# Patient Record
Sex: Male | Born: 1940 | Race: White | Hispanic: No | Marital: Married | Smoking: Never smoker
Health system: Southern US, Community
[De-identification: ages and names within clinical notes are randomized; demographics above are authoritative.]

## PROBLEM LIST (undated history)

## (undated) DIAGNOSIS — I2510 Atherosclerotic heart disease of native coronary artery without angina pectoris: Secondary | ICD-10-CM

## (undated) DIAGNOSIS — E785 Hyperlipidemia, unspecified: Secondary | ICD-10-CM

## (undated) DIAGNOSIS — K635 Polyp of colon: Secondary | ICD-10-CM

## (undated) DIAGNOSIS — M1990 Unspecified osteoarthritis, unspecified site: Secondary | ICD-10-CM

## (undated) DIAGNOSIS — I679 Cerebrovascular disease, unspecified: Secondary | ICD-10-CM

## (undated) DIAGNOSIS — I219 Acute myocardial infarction, unspecified: Secondary | ICD-10-CM

## (undated) DIAGNOSIS — I10 Essential (primary) hypertension: Secondary | ICD-10-CM

## (undated) DIAGNOSIS — K219 Gastro-esophageal reflux disease without esophagitis: Secondary | ICD-10-CM

## (undated) HISTORY — DX: Atherosclerotic heart disease of native coronary artery without angina pectoris: I25.10

## (undated) HISTORY — DX: Hyperlipidemia, unspecified: E78.5

## (undated) HISTORY — PX: CARDIAC CATHETERIZATION: SHX172

## (undated) HISTORY — DX: Cerebrovascular disease, unspecified: I67.9

## (undated) HISTORY — DX: Polyp of colon: K63.5

## (undated) HISTORY — DX: Gastro-esophageal reflux disease without esophagitis: K21.9

## (undated) HISTORY — DX: Essential (primary) hypertension: I10

---

## 1997-06-27 DIAGNOSIS — K635 Polyp of colon: Secondary | ICD-10-CM

## 1997-06-27 HISTORY — DX: Polyp of colon: K63.5

## 2001-08-22 ENCOUNTER — Ambulatory Visit (HOSPITAL_COMMUNITY): Admission: RE | Admit: 2001-08-22 | Discharge: 2001-08-22 | Payer: Self-pay | Admitting: Internal Medicine

## 2003-05-19 ENCOUNTER — Ambulatory Visit (HOSPITAL_COMMUNITY): Admission: RE | Admit: 2003-05-19 | Discharge: 2003-05-19 | Payer: Self-pay | Admitting: *Deleted

## 2003-05-29 ENCOUNTER — Inpatient Hospital Stay (HOSPITAL_COMMUNITY): Admission: RE | Admit: 2003-05-29 | Discharge: 2003-05-31 | Payer: Self-pay | Admitting: Cardiology

## 2005-01-25 ENCOUNTER — Ambulatory Visit: Payer: Self-pay | Admitting: *Deleted

## 2005-02-02 ENCOUNTER — Encounter (HOSPITAL_COMMUNITY): Admission: RE | Admit: 2005-02-02 | Discharge: 2005-03-04 | Payer: Self-pay | Admitting: *Deleted

## 2005-02-02 ENCOUNTER — Ambulatory Visit: Payer: Self-pay | Admitting: *Deleted

## 2005-02-08 ENCOUNTER — Ambulatory Visit: Payer: Self-pay | Admitting: *Deleted

## 2005-05-13 ENCOUNTER — Ambulatory Visit: Payer: Self-pay | Admitting: *Deleted

## 2005-09-25 DIAGNOSIS — I219 Acute myocardial infarction, unspecified: Secondary | ICD-10-CM

## 2005-09-25 HISTORY — DX: Acute myocardial infarction, unspecified: I21.9

## 2005-10-16 ENCOUNTER — Inpatient Hospital Stay (HOSPITAL_COMMUNITY): Admission: AD | Admit: 2005-10-16 | Discharge: 2005-10-18 | Payer: Self-pay | Admitting: Cardiology

## 2005-10-16 ENCOUNTER — Ambulatory Visit: Payer: Self-pay | Admitting: Cardiology

## 2005-10-25 ENCOUNTER — Ambulatory Visit: Payer: Self-pay

## 2005-11-01 ENCOUNTER — Encounter (HOSPITAL_COMMUNITY): Admission: RE | Admit: 2005-11-01 | Discharge: 2005-12-01 | Payer: Self-pay | Admitting: *Deleted

## 2005-11-02 ENCOUNTER — Ambulatory Visit: Payer: Self-pay | Admitting: *Deleted

## 2005-12-02 ENCOUNTER — Encounter (HOSPITAL_COMMUNITY): Admission: RE | Admit: 2005-12-02 | Discharge: 2006-01-01 | Payer: Self-pay | Admitting: *Deleted

## 2006-01-02 ENCOUNTER — Encounter (HOSPITAL_COMMUNITY): Admission: RE | Admit: 2006-01-02 | Discharge: 2006-02-01 | Payer: Self-pay | Admitting: *Deleted

## 2006-02-03 ENCOUNTER — Encounter (HOSPITAL_COMMUNITY): Admission: RE | Admit: 2006-02-03 | Discharge: 2006-03-05 | Payer: Self-pay | Admitting: *Deleted

## 2006-04-17 ENCOUNTER — Encounter (INDEPENDENT_AMBULATORY_CARE_PROVIDER_SITE_OTHER): Payer: Self-pay | Admitting: *Deleted

## 2006-04-17 ENCOUNTER — Inpatient Hospital Stay (HOSPITAL_COMMUNITY): Admission: RE | Admit: 2006-04-17 | Discharge: 2006-04-18 | Payer: Self-pay | Admitting: Vascular Surgery

## 2006-06-27 HISTORY — PX: CAROTID ENDARTERECTOMY: SUR193

## 2006-06-27 HISTORY — PX: COLONOSCOPY: SHX174

## 2006-10-11 ENCOUNTER — Encounter: Payer: Self-pay | Admitting: Physician Assistant

## 2006-10-11 ENCOUNTER — Ambulatory Visit: Payer: Self-pay | Admitting: Cardiology

## 2007-01-24 ENCOUNTER — Ambulatory Visit: Payer: Self-pay | Admitting: Cardiovascular Disease

## 2007-01-30 ENCOUNTER — Ambulatory Visit: Payer: Self-pay | Admitting: Cardiology

## 2007-01-30 ENCOUNTER — Encounter (HOSPITAL_COMMUNITY): Admission: RE | Admit: 2007-01-30 | Discharge: 2007-03-01 | Payer: Self-pay | Admitting: Cardiovascular Disease

## 2007-02-06 ENCOUNTER — Ambulatory Visit: Payer: Self-pay | Admitting: Vascular Surgery

## 2007-07-03 ENCOUNTER — Encounter (INDEPENDENT_AMBULATORY_CARE_PROVIDER_SITE_OTHER): Payer: Self-pay | Admitting: *Deleted

## 2007-07-03 LAB — CONVERTED CEMR LAB
ALT: 21 units/L
AST: 16 units/L
Albumin: 4.4 g/dL
Alkaline Phosphatase: 46 units/L
BUN: 19 mg/dL
CO2: 23 meq/L
Calcium: 9.2 mg/dL
Chloride: 107 meq/L
Cholesterol: 184 mg/dL
Creatinine, Ser: 1.09 mg/dL
Glucose, Bld: 100 mg/dL
HDL: 39 mg/dL
LDL Cholesterol: 115 mg/dL
Potassium: 5.1 meq/L
Sodium: 141 meq/L
Total Protein: 7.4 g/dL
Triglycerides: 150 mg/dL

## 2008-02-05 ENCOUNTER — Ambulatory Visit: Payer: Self-pay | Admitting: Vascular Surgery

## 2008-07-17 ENCOUNTER — Ambulatory Visit: Payer: Self-pay | Admitting: Cardiology

## 2008-07-22 ENCOUNTER — Ambulatory Visit (HOSPITAL_COMMUNITY): Admission: RE | Admit: 2008-07-22 | Discharge: 2008-07-22 | Payer: Self-pay | Admitting: Family Medicine

## 2008-07-28 HISTORY — PX: COLONOSCOPY WITH ESOPHAGOGASTRODUODENOSCOPY (EGD): SHX5779

## 2008-07-29 ENCOUNTER — Ambulatory Visit: Payer: Self-pay | Admitting: Internal Medicine

## 2008-07-31 ENCOUNTER — Ambulatory Visit: Payer: Self-pay | Admitting: Internal Medicine

## 2008-07-31 ENCOUNTER — Ambulatory Visit (HOSPITAL_COMMUNITY): Admission: RE | Admit: 2008-07-31 | Discharge: 2008-07-31 | Payer: Self-pay | Admitting: Internal Medicine

## 2008-07-31 LAB — HM COLONOSCOPY

## 2008-10-30 ENCOUNTER — Encounter (INDEPENDENT_AMBULATORY_CARE_PROVIDER_SITE_OTHER): Payer: Self-pay | Admitting: *Deleted

## 2008-11-14 ENCOUNTER — Encounter (INDEPENDENT_AMBULATORY_CARE_PROVIDER_SITE_OTHER): Payer: Self-pay | Admitting: *Deleted

## 2009-02-03 ENCOUNTER — Ambulatory Visit: Payer: Self-pay | Admitting: Vascular Surgery

## 2009-07-15 ENCOUNTER — Encounter (INDEPENDENT_AMBULATORY_CARE_PROVIDER_SITE_OTHER): Payer: Self-pay | Admitting: *Deleted

## 2009-07-16 ENCOUNTER — Encounter (INDEPENDENT_AMBULATORY_CARE_PROVIDER_SITE_OTHER): Payer: Self-pay

## 2009-07-16 LAB — CONVERTED CEMR LAB
ALT: 13 units/L
AST: 15 units/L
Albumin: 4.2 g/dL
Alkaline Phosphatase: 43 units/L
BUN: 20 mg/dL
CO2: 25 meq/L
Calcium: 9.4 mg/dL
Chloride: 105 meq/L
Cholesterol: 164 mg/dL
Creatinine, Ser: 1.08 mg/dL
GFR calc non Af Amer: >60 mL/min
Glomerular Filtration Rate, Af Am: >60 mL/min/{1.73_m2}
Glucose, Bld: 92 mg/dL
HCT: 42.7 %
HDL: 35 mg/dL
Hemoglobin: 13.7 g/dL
LDL Cholesterol: 98 mg/dL
MCV: 93 fL
Platelets: 273 10*3/uL
Potassium: 5 meq/L
Sodium: 139 meq/L
Total Protein: 7.1 g/dL
Triglycerides: 157 mg/dL
WBC: 6.5 10*3/uL

## 2009-07-16 LAB — PSA: PSA: 1.44

## 2009-07-17 ENCOUNTER — Encounter (INDEPENDENT_AMBULATORY_CARE_PROVIDER_SITE_OTHER): Payer: Self-pay

## 2009-07-21 ENCOUNTER — Ambulatory Visit: Payer: Self-pay | Admitting: Cardiology

## 2009-07-21 ENCOUNTER — Encounter (INDEPENDENT_AMBULATORY_CARE_PROVIDER_SITE_OTHER): Payer: Self-pay | Admitting: *Deleted

## 2009-07-21 DIAGNOSIS — K8020 Calculus of gallbladder without cholecystitis without obstruction: Secondary | ICD-10-CM | POA: Insufficient documentation

## 2009-09-16 ENCOUNTER — Encounter (INDEPENDENT_AMBULATORY_CARE_PROVIDER_SITE_OTHER): Payer: Self-pay | Admitting: *Deleted

## 2009-09-16 ENCOUNTER — Encounter: Payer: Self-pay | Admitting: Cardiology

## 2009-09-16 LAB — CONVERTED CEMR LAB
Cholesterol: 169 mg/dL
Cholesterol: 169 mg/dL
HDL: 39 mg/dL
HDL: 39 mg/dL — ABNORMAL LOW
LDL Cholesterol: 91 mg/dL
LDL Cholesterol: 91 mg/dL
Total CHOL/HDL Ratio: 4.3
Triglycerides: 196 mg/dL
Triglycerides: 196 mg/dL — ABNORMAL HIGH
VLDL: 39 mg/dL

## 2009-09-17 ENCOUNTER — Encounter (INDEPENDENT_AMBULATORY_CARE_PROVIDER_SITE_OTHER): Payer: Self-pay | Admitting: *Deleted

## 2009-09-18 ENCOUNTER — Encounter (INDEPENDENT_AMBULATORY_CARE_PROVIDER_SITE_OTHER): Payer: Self-pay | Admitting: *Deleted

## 2010-06-08 ENCOUNTER — Ambulatory Visit: Payer: Self-pay | Admitting: Vascular Surgery

## 2010-07-19 ENCOUNTER — Encounter (INDEPENDENT_AMBULATORY_CARE_PROVIDER_SITE_OTHER): Payer: Self-pay | Admitting: *Deleted

## 2010-07-22 ENCOUNTER — Ambulatory Visit
Admission: RE | Admit: 2010-07-22 | Discharge: 2010-07-22 | Payer: Self-pay | Source: Home / Self Care | Attending: Cardiology | Admitting: Cardiology

## 2010-07-22 ENCOUNTER — Encounter (INDEPENDENT_AMBULATORY_CARE_PROVIDER_SITE_OTHER): Payer: Self-pay | Admitting: *Deleted

## 2010-07-27 ENCOUNTER — Encounter: Payer: Self-pay | Admitting: Cardiology

## 2010-07-27 LAB — CONVERTED CEMR LAB
ALT: 11 units/L (ref 0–53)
AST: 13 units/L (ref 0–37)
Albumin: 4.2 g/dL (ref 3.5–5.2)
Alkaline Phosphatase: 36 units/L — ABNORMAL LOW (ref 39–117)
BUN: 21 mg/dL (ref 6–23)
CO2: 23 meq/L (ref 19–32)
Calcium: 9.3 mg/dL (ref 8.4–10.5)
Chloride: 104 meq/L (ref 96–112)
Cholesterol: 149 mg/dL (ref 0–200)
Creatinine, Ser: 1.28 mg/dL (ref 0.40–1.50)
Glucose, Bld: 93 mg/dL (ref 70–99)
HDL: 34 mg/dL — ABNORMAL LOW (ref >39)
LDL Cholesterol: 81 mg/dL (ref 0–99)
Potassium: 4.4 meq/L (ref 3.5–5.3)
Sodium: 138 meq/L (ref 135–145)
Total Bilirubin: 0.4 mg/dL (ref 0.3–1.2)
Total CHOL/HDL Ratio: 4.4
Total Protein: 6.8 g/dL (ref 6.0–8.3)
Triglycerides: 171 mg/dL — ABNORMAL HIGH (ref <150)
VLDL: 34 mg/dL (ref 0–40)

## 2010-07-27 NOTE — Assessment & Plan Note (Signed)
Summary: 1 YR F/U PER CKOUT 07/17/08-DSF  Medications Added TOPROL XL 25 MG XR24H-TAB (METOPROLOL SUCCINATE) take 1 tab daily SIMVASTATIN 20 MG TABS (SIMVASTATIN) take 1 tab daily SIMVASTATIN 40 MG TABS (SIMVASTATIN) Take one tablet by mouth daily at bedtime PROTONIX 40 MG SOLR (PANTOPRAZOLE SODIUM) take 1 tab daily      Allergies Added: NKDA  Visit Type:  Follow-up Primary Provider:  Dr.Mark Nobie Putnam   History of Present Illness: Return visit for this very pleasant 70 year old gentleman with coronary artery disease that has been quiescent for 4 years since he suffered stenosis in a stent that had been inserted 3 years prior.  He denies all cardiopulmonary symptoms.  He remains active without chest discomfort or dyspnea.  He has noted no edema.  He has had no significant medical problems over the past year.  In a recent annual evaluation by Dr. Nobie Putnam, attention was drawn to his history of cholelithiasis and to a suboptimal lipid profile  EKG  Procedure date:  07/21/2009  Findings:      Sinus bradycardia at a rate of 53 Left axis deviation Borderline low voltage in the chest leads Abnormal R wave progression Comparison with prior study of 07/17/2008-R wave progression is      now abnormal  Nuclear ETT  Procedure date:  01/30/2007  Findings:      Negative study with adequate exercise tolerance,  a positive stress electrocardiogram in the absence of angina, mild left ventricular dilatation, mildly impaired left ventricular systolic function in a somewhat segmental pattern and normal myocardial perfusion.   Current Medications (verified): 1)  Ramipril 10 Mg Caps (Ramipril) .... Take One Capsule By Mouth Daily 2)  Nitroglycerin 0.4 Mg Subl (Nitroglycerin) .... One Tablet Under Tongue Every 5 Minutes As Needed For Chest Pain---May Repeat Times Three 3)  Plavix 75 Mg Tabs (Clopidogrel Bisulfate) .... Take One Tablet By Mouth Daily 4)  Toprol Xl 25 Mg Xr24h-Tab (Metoprolol  Succinate) .... Take 1 Tab Daily 5)  Simvastatin 40 Mg Tabs (Simvastatin) .... Take One Tablet By Mouth Daily At Bedtime 6)  Protonix 40 Mg Solr (Pantoprazole Sodium) .... Take 1 Tab Daily  Allergies (verified): No Known Drug Allergies  Past History:  PMH, FH, and Social History reviewed and updated.  Past Medical History: ASCVD : stent to RI in 2002/11/12. 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 while      off Plavix->reintervention Cerebrovascular disease: left carotid bruit with 40-59% ICA stenosis; right carotid endarterectomy in 11/12/2006 HYPERTENSION, UNSPECIFIED (ICD-401.9) HYPERCHOLESTEROLEMIA  IIA (ICD-272.0) Colonic polyps: adenoma resected in 11/11/1997; history of diverticula  Past Surgical History: Right carotid endarterectomy in Nov 12, 2006  Family History: Father died at age 30 due to trauma; history of cardiac disease Grandparents had history of myocardial infarction and CVA Siblings: 3 brothers and one sister; one brother with conduction system disease  Social History: Part Time - [place] x's 50 yrs Married and resides in [place] Tobacco Use - No.  Alcohol Use - no Drug Use - no  Review of Systems       see history of present illness  Vital Signs:  Patient profile:   70 year old male Height:      68 inches Weight:      197 pounds BMI:     30.06 Pulse rate:   63 / minute BP sitting:   119 / 74  (right arm)  Vitals Entered By: Dreama Saa, CNA (July 21, 2009 1:09 PM)  Physical Exam  General:   General-Well developed; no acute distress:   Neck-No JVD; faint left carotid bruit; right carotid endarterectomy scar Lungs-No tachypnea, no rales; no rhonchi; no wheezes: Cardiovascular-normal PMI; normal S1 and S2; S4 present Abdomen-BS normal; soft and non-tender without masses or organomegaly:  Musculoskeletal-No deformities, no cyanosis or clubbing: Neurologic-Normal cranial nerves; symmetric strength and tone:  Skin-Warm, no  significant lesions: Extremities-Nl distal pulses; trace edema:     Impression & Recommendations:  Problem # 1:  ASCVD Mr. Kemppainen has done superbly since repeat coronary intervention 4 years ago.  The good news at that time was that there had been no progression of disease in other vessels.  Our current approach to his coronary problems will be continued.  Problem # 2:  Cerebrovascular disease He had a widely patent right carotid endarterectomy site and no progression of disease in the left internal carotid artery when last assessed by Dr. Hart Rochester approximately one year ago.  Vascular surgery will continue to follow him for this problem.  Problem # 3:  HYPERTENSION Blood pressure is well controlled.  Problem # 4:  CHOLELITHIASIS, ASYMPTOMATIC (ICD-574.20) I concur with Dr. Nobie Putnam that intervention is not required until symptoms occur that are clearly related to the patient's gallbladder disease.  Problem # 5:  HYPERCHOLESTEROLEMIA  IIA (ICD-272.0) Recent lipid profile was fairly good, but suboptimal with an LDL close to 100.  I fully concur with Dr. Geanie Logan plan to increase simvastatin to 40 mg q.d.  He may in fact require 80 mg per day, but a repeat lipid profile will be checked in 2 months prior to further adjustment of his medication.  I will see this nice gentleman again in one year.  Other Orders: Future Orders: T-Lipid Profile ([id number]) ... 09/18/2009  Patient Instructions: 1)  Your physician recommends that you schedule a follow-up appointment in: 1 YEAR 2)  Your physician recommends that you return for lab work in: 2 MONTHS

## 2010-07-27 NOTE — Miscellaneous (Signed)
Summary: CMP,CBC w/diff and Lipids  Clinical Lists Changes  Observations: Added new observation of CALCIUM: 9.4 mg/dL (16/03/9603 54:09) Added new observation of ALBUMIN: 4.2 g/dL (81/19/1478 29:56) Added new observation of PROTEIN, TOT: 7.1 g/dL (21/30/8657 84:69) Added new observation of SGPT (ALT): 13 units/L (07/16/2009 16:10) Added new observation of SGOT (AST): 15 units/L (07/16/2009 16:10) Added new observation of ALK PHOS: 43 units/L (07/16/2009 16:10) Added new observation of BILI DIRECT: Bili total: 0.4 mg/dL (62/95/2841 32:44) Added new observation of GFR AA: >60 mL/min/1.40m2 (07/16/2009 16:10) Added new observation of GFR: >60 mL/min (07/16/2009 16:10) Added new observation of CREATININE: 1.08 mg/dL (06/29/7251 66:44) Added new observation of BUN: 20 mg/dL (03/47/4259 56:38) Added new observation of BG RANDOM: 92 mg/dL (75/64/3329 51:88) Added new observation of CO2 PLSM/SER: 25 meq/L (07/16/2009 16:10) Added new observation of CL SERUM: 105 meq/L (07/16/2009 16:10) Added new observation of K SERUM: 5.0 meq/L (07/16/2009 16:10) Added new observation of NA: 139 meq/L (07/16/2009 16:10) Added new observation of LDL: 98 mg/dL (41/66/0630 16:01) Added new observation of HDL: 35 mg/dL (09/32/3557 32:20) Added new observation of TRIGLYC TOT: 157 mg/dL (25/42/7062 37:62) Added new observation of CHOLESTEROL: 164 mg/dL (83/15/1761 60:73) Added new observation of PLATELETK/UL: 273 K/uL (07/16/2009 16:10) Added new observation of MCV: 93.0 fL (07/16/2009 16:10) Added new observation of HCT: 42.7 % (07/16/2009 16:10) Added new observation of HGB: 13.7 g/dL (71/11/2692 85:46) Added new observation of WBC COUNT: 6.5 10*3/microliter (07/16/2009 16:10)

## 2010-07-27 NOTE — Letter (Signed)
Summary: [place] Future Lab Work Letter  [facility]     [address], [place]    Phone:   Fax:      July 21, 2009 MRN: [id number]   [facility] [address] [place], [place]  [address]      YOUR LAB WORK IS DUE  September 18, 2009 _________________________________________  Please go to [facility], located across the street from [facility] on the second floor.  Hours are Monday - Friday 7am until 7:30pm         Saturday 8am until 12noon    _X_  DO NOT EAT OR DRINK AFTER MIDNIGHT EVENING PRIOR TO LABWORK  __ YOUR LABWORK IS NOT FASTING --YOU MAY EAT PRIOR TO LABWORK

## 2010-07-27 NOTE — Letter (Signed)
Summary: [place] Results Engineer, agricultural at [facility]  [address], [place] [address]   Phone: [phone]  Fax: [phone]      September 18, 2009 MRN: [id number]   Kevin Reeves [address] [place], [place]  [address]   Dear Mr. Langhorne,  Your test ordered by Selena Batten has been reviewed by your physician (or physician assistant) and was found to be normal or stable. Your physician (or physician assistant) felt no changes were needed at this time.  ____ Echocardiogram  ____ Cardiac Stress Test  ___x_ Lab Work  ____ Peripheral vascular study of arms, legs or neck  ____ CT scan or X-ray  ____ Lung or Breathing test  ____ Other:  No change in medical treatment at this time, per Dr. Dietrich Pates.  Enclosed is a copy of your labwork for your records.   Thank you, [name] RN    [name], MD, Lenise Arena.C.Gaylord Shih, MD, F.A.C.C Lewayne Bunting, MD, F.A.C.C Nona Dell, MD, F.A.C.C Charlton Haws, MD, Lenise Arena.C.C

## 2010-07-27 NOTE — Miscellaneous (Signed)
Summary: LABS CMP,LIPIDS,07/03/2007  Clinical Lists Changes  Observations: Added new observation of CALCIUM: 9.2 mg/dL (69/62/9528 41:32) Added new observation of ALBUMIN: 4.4 g/dL (44/06/270 53:66) Added new observation of PROTEIN, TOT: 7.4 g/dL (44/08/4740 59:56) Added new observation of SGPT (ALT): 21 units/L (07/03/2007 15:15) Added new observation of SGOT (AST): 16 units/L (07/03/2007 15:15) Added new observation of ALK PHOS: 46 units/L (07/03/2007 15:15) Added new observation of CREATININE: 1.09 mg/dL (38/75/6433 29:51) Added new observation of BUN: 19 mg/dL (88/41/6606 30:16) Added new observation of BG RANDOM: 100 mg/dL (07/05/3233 57:32) Added new observation of CO2 PLSM/SER: 23 meq/L (07/03/2007 15:15) Added new observation of CL SERUM: 107 meq/L (07/03/2007 15:15) Added new observation of K SERUM: 5.1 meq/L (07/03/2007 15:15) Added new observation of NA: 141 meq/L (07/03/2007 15:15) Added new observation of LDL: 115 mg/dL (20/25/4270 62:37) Added new observation of HDL: 39 mg/dL (62/83/1517 61:60) Added new observation of TRIGLYC TOT: 150 mg/dL (73/71/0626 94:85) Added new observation of CHOLESTEROL: 184 mg/dL (46/27/0350 09:38)

## 2010-07-27 NOTE — Letter (Signed)
Summary: [place] Results Engineer, agricultural at [facility]  [address], [place] [address]   Phone: [phone]  Fax: [phone]      September 17, 2009 MRN: [id number]   Kevin Reeves [address] [place], [place]  [address]   Dear Mr. Jain,  Your test ordered by Selena Batten has been reviewed by your physician (or physician assistant) and was found to be normal or stable. Your physician (or physician assistant) felt no changes were needed at this time.  ____ Echocardiogram  ____ Cardiac Stress Test  __x__ Lab Work  ____ Peripheral vascular study of arms, legs or neck  ____ CT scan or X-ray  ____ Lung or Breathing test  ____ Other:  No change in medical treatment at this time, per Dr. Dietrich Pates.  Enclosed is a copy of  your labwork for your records.  Thank you, [name] RN    [place] Bing, MD, Lenise Arena.C.Gaylord Shih, MD, F.A.C.C Lewayne Bunting, MD, F.A.C.C Nona Dell, MD, F.A.C.C Charlton Haws, MD, Lenise Arena.C.C

## 2010-07-29 NOTE — Letter (Signed)
Summary: [place] Future Lab Work Engineer, agricultural at [facility]  [address] [place] [address]   Phone: [phone]  Fax: [phone]     July 22, 2010 MRN: [id number]   Kevin Reeves [address] [place], [place]  [address]      YOUR LAB WORK IS DUE   MONDAY   July 26, 2010  Please go to [facility], located across the street from [facility] on the second floor.  Hours are Monday - Friday 7am until 7:30pm         Saturday 8am until 12noon    __  DO NOT EAT OR DRINK AFTER MIDNIGHT EVENING PRIOR TO LABWORK

## 2010-07-29 NOTE — Miscellaneous (Signed)
Summary: labs lipids 09/16/2009  Clinical Lists Changes  Observations: Added new observation of LDL: 91 mg/dL (16/03/9603 54:09) Added new observation of HDL: 39 mg/dL (81/19/1478 29:56) Added new observation of TRIGLYC TOT: 196 mg/dL (21/30/8657 84:69) Added new observation of CHOLESTEROL: 169 mg/dL (62/95/2841 32:44)

## 2010-08-04 NOTE — Assessment & Plan Note (Signed)
Summary: 1 YR F/U PER CHECKOUT ON 07/21/09/TG  Medications Added DAILY MULTI  TABS (MULTIPLE VITAMINS-MINERALS) take 1 tab daily      Allergies Added: NKDA  Visit Type:  Follow-up Referring Provider:  Cynda Reeves Primary Provider:  Dr.Fusco   History of Present Illness: [name] returns to the office as scheduled for continued assessment and treatment of coronary disease and cardiovascular risk factors.  He previously suffered stent thrombosis following discontinuation of clopidogrel and is now committed to lifelong treatment with this or a similar agent.  Over the past year, he has done very well with good exercise tolerance, no chest discomfort, no dyspnea, no orthopnea, no PND, no syncope and no pedal edema.  He has gastroesophageal reflux disease requiing an esophageal dilatation for stricture in the past.  [name] follows him for vascular disease, and a recent carotid ultrasound in his office apparently showed good results.  An abdominal ultrasound obtained at [facility] showed no abdominal aortic disease.   Current Medications (verified): 1)  Ramipril 10 Mg Caps (Ramipril) .... Take One Capsule By Mouth Daily 2)  Nitroglycerin 0.4 Mg Subl (Nitroglycerin) .... One Tablet Under Tongue Every 5 Minutes As Needed For Chest Pain---May Repeat Times Three 3)  Plavix 75 Mg Tabs (Clopidogrel Bisulfate) .... Take One Tablet By Mouth Daily 4)  Toprol Xl 25 Mg Xr24h-Tab (Metoprolol Succinate) .... Take 1 Tab Daily 5)  Simvastatin 40 Mg Tabs (Simvastatin) .... Take One Tablet By Mouth Daily At Bedtime 6)  Protonix 40 Mg Solr (Pantoprazole Sodium) .... Take 1 Tab Daily 7)  Daily Multi  Tabs (Multiple Vitamins-Minerals) .... Take 1 Tab Daily  Allergies (verified): No Known Drug Allergies  Comments:  Nurse/Medical Assistant: patient brought med list we also reviewed previous ov for meds [facility]  Past History:  PMH, FH, and Social History reviewed and  updated.  Past Medical History: ASCVD : stent to RI in 2004. 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  Vital Signs:  Patient profile:   70 year old male Weight:      192 pounds BMI:     29.30 O2 Sat:      95 % on Room air Pulse rate:   57 / minute BP sitting:   107 / 69  (left arm)  Vitals Entered By: Kevin Saa, CNA (July 22, 2010 11:05 AM)  O2 Flow:  Room air  Physical Exam  General:  Mildly overweight; well developed; no acute distress:   Neck-No JVD;  left carotid bruit; right carotid endarterectomy scar Lungs-No tachypnea, no rales; no rhonchi; no wheezes: Cardiovascular-normal PMI; normal S1 and S2; S4 present Abdomen-BS normal; soft and non-tender without masses or organomegaly; aortic pulsation not palpable. Musculoskeletal-No deformities, no cyanosis or clubbing: Neurologic-Normal cranial nerves; symmetric strength and tone:  Skin-Warm, no significant lesions: Extremities-Nl distal pulses; trace edema:     Impression & Recommendations:  Problem # 1:  ATHEROSCLEROTIC CARDIOVASCULAR DISEASE (ICD-429.2) Patient remains asymptomatic.  We will continue optimal control of cardiovascular risk factors.  Problem # 2:  HYPERTENSION (ICD-401.9) Control is excellent with current medications, which will be continued.  Problem # 3:  HYPERLIPIDEMIA (ICD-272.4) Fasting lipid profile to be obtained.  I will plan to see  this very nice gentleman again in one year.  Other Orders: Future Orders: T-Lipid Profile ([id number]) ... 07/26/2010 T-Comprehensive Metabolic Panel [id number]) ... 07/26/2010  Patient Instructions: 1)  Your physician recommends that you schedule a follow-up appointment in: 1 YEAR 2)  Your physician recommends that you return for lab work in: [employer]

## 2010-10-12 LAB — H. PYLORI ANTIBODY, IGG: H Pylori IgG: 0.6 {ISR}

## 2010-11-09 NOTE — Consult Note (Signed)
NEW PATIENT CONSULTATION   Kevin Reeves, Kevin Reeves  DOB:  08/13/1940                                       06/08/2010  CHART#:[id number]   Patient is a 70 year old male patient known to me from previous right  carotid endarterectomy 3-1/2 years ago for an asymptomatic severe right  internal carotid stenosis.  He has had no hemispheric or nonhemispheric  TIAs, amaurosis fugax, diplopia, blurred vision or syncope.  He does  take Plavix on a daily basis.   CHRONIC MEDICAL PROBLEMS:  1. Coronary artery disease, previous PTCA and stenting in 2007.  2. Hypertension.  3. Hyperlipidemia.  4. Arthritis with severe right shoulder pain.   SOCIAL HISTORY:  The patient is married, retired from [employer].  Has 3  children.  Does not use tobacco or alcohol.   FAMILY HISTORY:  Positive for coronary artery disease in his father.  Arrhythmias in his brother.  Stroke in a grandfather and diabetes in his  father.   REVIEW OF SYSTEMS:  Denies any chest pain, dyspnea on exertion,  anorexia, weight loss, productive cough, bronchitis, wheezing.  Positive  for arthritis and joint pain, particularly right shoulder.  No other  systems are negative for review of systems.   PHYSICAL EXAMINATION:  Blood pressure 110/72, heart rate 60,  respirations 14.  General:  He is a well-developed and well-nourished  male who is in no apparent distress, alert and oriented x3.  HEENT:  Normal for age.  EOMs intact.  Lungs:  Clear to auscultation.  No  rhonchi or wheezing.  Cardiovascular:  Regular rate and rhythm.  No  murmurs.  Carotid pulses are 3+.  No bruits on the right.  A very soft  bruit on the left.  Abdomen:  Soft, nontender with no masses.  Musculoskeletal:  Free of major deformities.  Neurologic:  Normal.  Skin:  Free of rashes.  Lower extremity exam reveals 3+ femoral pulses  bilaterally with well-perfused lower extremities.  No edema.   Today I ordered a carotid duplex exam which I have  reviewed and  interpreted.  He has a moderate approximately 50% left internal carotid  stenosis with no evidence of flow reduction on the right side where the  endarterectomy was performed.   I reassured him regarding these findings.  He will continue taking his  Plavix for his cardiac stent and return to see [place] in a year for a follow-  up carotid duplex unless he develops any symptoms in the interim.     Quita Skye Hart Rochester, M.D.  Electronically Signed   JDL/MEDQ  D:  06/08/2010  T:  06/09/2010  Job:  [id number]   cc:   Dr. Sherwood Gambler

## 2010-11-09 NOTE — Procedures (Signed)
CAROTID DUPLEX EXAM   INDICATION:  Followup of known carotid artery disease.  The patient is  currently asymptomatic.   HISTORY:  Diabetes:  No.  Cardiac:  Arrhythmia, MI, angioplasty and stent in 2004 and 2007.  Hypertension:  Yes.  Smoking:  Previous Surgery:  Right CEA with DPA on 04/17/2006 by Dr. Hart Rochester.  CV History:  Amaurosis Fugax No, Paresthesias No, Hemiparesis No                                       RIGHT             LEFT  Brachial systolic pressure:         110               108  Brachial Doppler waveforms:         Triphasic         Triphasic  Vertebral direction of flow:        Antegrade         Antegrade  DUPLEX VELOCITIES (cm/sec)  CCA peak systolic                   96                68  ECA peak systolic                   92                281  ICA peak systolic                   81                164  ICA end diastolic                   32                70  PLAQUE MORPHOLOGY:                  Intimal thickening                  Mixed  PLAQUE AMOUNT:                      Minimal           Moderate  PLAQUE LOCATION:                    Bifurcation, ICA  ICA, ECA   IMPRESSION:  1. Right ICA without recurrent stenosis status post CEA.  2. Left 40-59% ICA stenosis.  3. Left ECA stenosis.  4. Study essentially unchanged from that on 02/06/2007.   ___________________________________________  Quita Skye. Hart Rochester, M.D.   PB/MEDQ  D:  02/06/2008  T:  02/06/2008  Job:  [id number]

## 2010-11-09 NOTE — Op Note (Signed)
NAME:  Kevin Reeves, Kevin Reeves               ACCOUNT NO.:  [id number]   MEDICAL RECORD NO.:  [id number]          PATIENT TYPE:  AMB   LOCATION:  DAY                           FACILITY:  [facility]   PHYSICIAN:  R. Roetta Sessions, M.D. DATE OF BIRTH:  Sep 03, 1940   DATE OF PROCEDURE:  07/31/2008  DATE OF DISCHARGE:                               OPERATIVE REPORT   Diagnostic EGD followed by surveillance colonoscopy.   INDICATIONS FOR PROCEDURE:  A 67-year gentleman who had longstanding  chronic gastroesophageal reflux disease symptoms and intermittent  episodes of nausea and vomiting superimposed.  He also has a history of  colonic polyps.  Recent gallbladder ultrasound demonstrated  cholelithiasis.  There was a 15-mm calculus in the neck of the  gallbladder.  I talked to Mr. Marban recently in the office and told him  that he likely will need to get his gallbladder out prior to surgical  referral.  Will go ahead and size up his upper GI tract and perform a  surveillance colonoscopy.  Risks, benefits, alternatives and limitations  of this approach have been reviewed, questions answered, he is  agreeable.  Please see the documentation in the medical record.   PROCEDURE NOTE:  The O2 saturation, blood pressure, pulse and  respirations were monitored throughout the entire procedure.   CONSCIOUS SEDATION:  1. Versed 0.5 mg IV.  2. Demerol 100 mg IV in divided doses for both procedures.  3. Cetacaine spray for topical oropharyngeal anesthesia.   INSTRUMENTATION:  Pentax video chip system.   FINDINGS:  An EGD examination of tubular esophagus revealed four-  quadrant distal esophageal erosions straddling a Schatzki's ring.  There  was no Barrett's esophagus.  The ring was patent and easily traversed.   Stomach:  Gastric cavity was emptied and insufflated well with air.  A  thorough examination of the gastric mucosa including retroflexed  proximal stomach and esophagogastric junction demonstrated a  moderate  sized hiatal hernia.  Again, the ring was seen well.  There were some  antral erosions, otherwise gastric mucosa appeared unremarkable.  Pylorus was patent, easily traversed.  Examination of the bulb and  second portion was undertaken.  There was a short, benign-appearing,  duodenal web between D1 and D2 which actually initially held off  advancement of the scope down the second portion of the duodenum with  some gentle pressure, this was traumatically dilated and the scope slid  down into the second portion of the duodenum.  Mucosa otherwise appeared  normal aside from scattered bulbar erosions.   Therapeutic/Diagnostic Maneuvers Performed:  None.   The patient tolerated procedure well, was prepared for colonoscopy.  Digital rectal exam revealed no abnormalities.  His __________prep was  adequate.   Colon:  The colonic mucosa was surveyed from the rectosigmoid junction  through the left transverse, right colon to the appendiceal orifice,  ileocecal valve and cecum.  These structures were well seen,  photographed for the record.  From this level the scope was cautiously  slowly withdrawn.  All previously-mentioned mucosal surfaces were again  seen.  The patient was noted to have  scattered pancolonic diverticula.  The remainder of colonic mucosa appeared normal.  Scope was pulled down  in the rectum where thorough examination of rectal mucosa, including  retroflexed view of the anal verge, demonstrated no abnormalities.  The  patient tolerated both procedures well, was reactive to endoscopy.   IMPRESSION:  1. Esophagogastroduodenoscopy:  Distal esophageal erosions consistent      with a mild erosive reflux esophagitis, noncritical Schatzki's ring      not manipulated, otherwise unremarkable esophagus.  2. Moderate-sized hiatal hernia, antral erosions, otherwise      unremarkable gastric mucosa.  3. Bulbar erosions with duodenal web as described above, dilated with      the  scope, otherwise D1 and D2 appeared unremarkable.   COLONOSCOPY FINDINGS:  Normal rectum, pancolonic diverticula, colonic  mucosa appeared normal.   RECOMMENDATIONS:  1. Begin Protonix 40 mg orally daily for gastroesophageal reflux      disease.  2. Check H. pylori serologies.  3. Diverticulosis literature provided to Mr. Baldinger.  4. Repeat surveillance colonoscopy in 5 years.  5. I have advised Mr. Staver to plan to see a surgeon to get his      gallbladder out electively in the near future.   He is not sure which surgeon he wants to see and he is going to stop by  and talk surgical referral over a bit with Dr. Nobie Putnam.      Jonathon Bellows, M.D.  Electronically Signed     RMR/MEDQ  D:  07/31/2008  T:  07/31/2008  Job:  [id number]   cc:   [name], M.D.  Fax: [phone]

## 2010-11-09 NOTE — Consult Note (Signed)
NAME:  Kevin Reeves, Kevin Reeves               ACCOUNT NO.:  [id number]   MEDICAL RECORD NO.:  [id number]          PATIENT TYPE:  AMB   LOCATION:  DAY                           FACILITY:  [facility]   PHYSICIAN:  R. Roetta Sessions, M.D. DATE OF BIRTH:  Jun 09, 1941   DATE OF CONSULTATION:  07/29/2008  DATE OF DISCHARGE:                                 CONSULTATION   REASON FOR CONSULTATION:  Intermittent nausea and vomiting,  cholelithiasis.   HISTORY OF PRESENT ILLNESS:  Kevin Reeves is a pleasant 70 year old  Caucasian male referred to [place] by Dr. [name] to further evaluate a  3-4 year history of intermittent nausea and vomiting.  Kevin Reeves tells  me that several times a month he can have episodes where he does about  his daily routine and does fine, goes to bed and about 2 or 3 o'clock in  the morning he has the urge to vomit.  He gets up and heaves and  actually brings up very little material and then symptoms settle down.  He denies really any abdominal pain.  He does describe heartburn  symptoms 2-3 times weekly for which he takes Tums.  He does not have any  odynophagia or dysphagia.  He does not use any nonsteroidal agents  beyond one aspirin a day.  He has never had his upper GI tract  evaluated.  He does take Plavix.   He does have a distant history of colonic adenoma removed by me in 1999  with a negative colonoscopy in 2003 (except for hyperplastic polyp).  He  is due for a surveillance exam.  He is not having any melena or  hematochezia.  He has lost about 10 pounds since 2003.  He denies early  satiety.  There has been no yellow jaundice, clay-colored stools or dark-  colored urine.  He was given some Kapidex by Dr. Nobie Putnam last month for  gastroesophageal reflux disease symptoms, but he admits to not taking  any of that agent thus far.  Gallbladder ultrasound on July 22, 2008  demonstrated cholelithiasis without evidence of cholecystitis.  There  was a 15 mm diameter  calculus in the gallbladder neck.   PAST MEDICAL HISTORY:  1. Coronary artery disease status post MI in 2007.  2. Hypertension.  3. Hypercholesterolemia.  4. History of peripheral arterial disease.  He has a stent in his      carotid artery.   CURRENT MEDICATIONS:  1. Toprol 25 mg daily.  2. Plavix 75 mg daily.  3. Altace 10 mg daily.  4. Vytorin 10/80 daily.  5. ASA 325 mg daily.  6. Vitamin supplement daily.  7. Nitroglycerin p.r.n.   ALLERGIES:  NO KNOWN DRUG ALLERGIES   FAMILY HISTORY:  Mother is alive at age 83 with dementia.  Father is  deceased at age 85 with heart problems.  No history of chronic GI or  liver illness.  No history of GI neoplasia.   SOCIAL HISTORY:  The patient is married.  He has 3 children and 4  grandchildren.  He works part-time to [employer] where he  has worked for  50 years.  No tobacco, no alcohol, no illicit drugs.   REVIEW OF SYSTEMS:  No recent chest pain, no dyspnea on exertion.  No  fever, chills.  Otherwise as in history of present illness.   PHYSICAL EXAMINATION:  GENERAL:  A pleasant 70 year old gentleman  resting comfortably.  VITAL SIGNS:  Weight 202, height 5 feet, 9 inches.  Temperature 97.7,  blood pressure 122/72, pulse 80.  SKIN:  Warm and dry.  HEENT:  No scleral icterus.  Conjunctivae are pink.  CHEST:  Lungs are clear to auscultation.  CARDIOVASCULAR:  Regular rate and rhythm without murmur, gallop or rub.  ABDOMEN:  Nondistended.  Positive bowel sounds, soft, nontender without  appreciable mass or organomegaly.  EXTREMITIES:  No edema.  RECTAL:  Exam deferred to the time of colonoscopy.   IMPRESSION:  Kevin Reeves is a pleasant 70 year old gentleman with  intermittent episodes of nausea and vomiting with a paucity of any  abdominal pain.  He also has what I feel are separate symptoms  consistent with gastroesophageal reflux disease.  He does have  cholelithiasis on recent ultrasound.   He has a distant history of  colonic adenomatous polyps and is due for  surveillance colonoscopy.   It is my impression that his intermittent nausea and vomiting, although  atypical, is most likely related to cholelithiasis.   RECOMMENDATIONS:  Ultimately, I feel Kevin Reeves is probably going to end  up getting his gallbladder out.  However, will go ahead and set him up  for surveillance colonoscopy, and at the same time will perform a  diagnostic EGD to size up his upper GI tract and assess him for  complicated gastroesophageal reflux disease and to rule out any luminal  pathology in his upper GI tract which may be contributing to  intermittent nausea and vomiting.  Will set endoscopic evaluation up in  the very near future, and then will make further recommendations  regarding probable surgical referral for cholecystectomy at that time.   I would like to thank Dr. [name] for allowing me to see this nice  gentleman.      Jonathon Bellows, M.D.  Electronically Signed     RMR/MEDQ  D:  07/29/2008  T:  07/29/2008  Job:  [id number]   cc:   [name], M.D.  Fax: [phone]

## 2010-11-09 NOTE — Assessment & Plan Note (Signed)
[facility] OFFICE NOTE   [name], [name]                      MRN:          [id number]  DATE:01/24/2007                            DOB:          [birth year]    Kevin Reeves returns today for follow-up.  He has a history of previous stent  to the ramus branch with reocclusion and cutting balloon angioplasty.  He needs a follow-up Myoview.  This was done in April 2007.   He also has vascular disease with a previous right CEA and residual  bruit.  He has not  his cholesterol checked in a while.  In talking to  him, he needs a refill on his Toprol and nitroglycerin.   He does not have any significant chest pain.  He is active.  He is  working at a farm supply three mornings a week.   REVIEW OF SYSTEMS:  Otherwise negative.   CURRENT MEDICATIONS:  1. Aspirin a day.  2. Plavix 75 a day.  3. Vytorin 10/80.  4. Ramipril 10 a day.   PHYSICAL EXAMINATION:  VITAL SIGNS:  Blood pressure 110/70, pulse 68 and  regular, afebrile, weight 191, respiratory rate 14.  HEENT:  Normal.  NECK:  Carotids have bilateral bruits with a previous right CEA scar.  There is no thyromegaly.  No lymphadenopathy.  No JVP.  LUNGS:  Clear, good diaphragmatic motion, no wheezing.  CARDIOVASCULAR:  There was an S1 and S2, normal heart sounds.  PMI is  normal.  ABDOMEN:  Benign, no tenderness.  Bowel sounds positive.  No  hepatosplenomegaly or hepatojugular reflux.  No AAA, no bruits.  EXTREMITIES:  Distal pulses are intact with no edema.  NEUROLOGIC:  Nonfocal.  There is no muscular weakness.   His baseline EKG is normal.   IMPRESSION:  1. Coronary disease.  Previous stenting of the ramus branch with      reocclusion.  Continue lifelong aspirin and Plavix.  Follow up      Myoview.  2. Hypercholesterolemia.  Previous LDL cholesterol at the end of last      year was 70.  Continue Vytorin.  Follow up lipid and liver profile      when  he has a stress test.  3. Carotid disease, needs follow-up with [name] as he has had his      previous Duplexes there and he needs a follow-up for a residual      bruit, particularly on the left side.  He is not      having any transient ischemic attack symptoms and he will continue      his aspirin and Plavix.  4. Hypertension.  Currently well controlled.  Continue Toprol at      current dose.   As long as his stress test is low risk, I will see him back in a year.     Kevin Reeves. Kevin Emms, MD, [facility]  Electronically Signed    PCN/MedQ  DD: 01/24/2007  DT: 01/25/2007  Job #: [id number]

## 2010-11-09 NOTE — Procedures (Signed)
CAROTID DUPLEX EXAM   INDICATION:  Follow-up evaluation of known carotid artery disease.   HISTORY:  Diabetes:  No.  Cardiac:  Arrhythmia, MI, angioplasty and stent in 2004 and 2007.  Hypertension:  Yes.  Smoking:  No.  Previous Surgery:  Right carotid endarterectomy with Dacron patch  angioplasty, 04/17/2006, by Dr. Hart Rochester.  CV History:  No.  Amaurosis Fugax No, Paresthesias No, Hemiparesis No.                                       RIGHT             LEFT  Brachial systolic pressure:         110               115  Brachial Doppler waveforms:         Normal            Normal  Vertebral direction of flow:        Antegrade         Antegrade  DUPLEX VELOCITIES (cm/sec)  CCA peak systolic                   89                93  ECA peak systolic                   126               315  ICA peak systolic                   88                156  ICA end diastolic                   52                68  PLAQUE MORPHOLOGY:                  Intimal thickening                  Heterogenous  PLAQUE AMOUNT:                      Minimal           Moderate  PLAQUE LOCATION:                    Bifurcation, ICA  Bifurcation, ICA,  ECA   IMPRESSION:  1. Patent right internal carotid artery with intimal thickening and no      evidence of restenosis.  2. Left internal carotid artery velocities suggestive of a 40% to 59%      stenosis.  3. Left external carotid artery stenosis.   ___________________________________________  Quita Skye Hart Rochester, M.D.   EM/MEDQ  D:  06/08/2010  T:  06/08/2010  Job:  [id number]

## 2010-11-09 NOTE — Procedures (Signed)
CAROTID DUPLEX EXAM   INDICATION:  Follow up carotid artery disease.   HISTORY:  Diabetes:  No  Cardiac:  Arrhythmia, MI, angioplasty and stent in 2004 and 2007.  Hypertension:  Yes  Smoking:  No  Previous Surgery:  Right carotid endarterectomy with DPA on 04/17/06, by  Dr. Hart Rochester.   CV History:  Amaurosis Fugax No, Paresthesias No, Hemiparesis No                                       RIGHT             LEFT  Brachial systolic pressure:         104               110  Brachial Doppler waveforms:         Triphasic         Triphasic  Vertebral direction of flow:        Antegrade         Antegrade  DUPLEX VELOCITIES (cm/sec)  CCA peak systolic                   93                85  ECA peak systolic                   121               257  ICA peak systolic                   82                150  ICA end diastolic                   29                62  PLAQUE MORPHOLOGY:                  Soft              Calcified  PLAQUE AMOUNT:                      Mild              Moderate-to-severe  PLAQUE LOCATION:                    ECA               ICA, ECA   IMPRESSION:  1. Bilateral ECA stenoses, left greater than right.  2. No right ICA stenosis, status post endarterectomy.  3. There is 40% to 59% left ICA stenosis (upper end of range).   ___________________________________________  Quita Skye Hart Rochester, M.D.   DP/MEDQ  D:  02/06/2007  T:  02/07/2007  Job:  [id number]

## 2010-11-09 NOTE — Procedures (Signed)
CAROTID DUPLEX EXAM   INDICATION:  Followup evaluation of known carotid artery disease.   HISTORY:  Diabetes:  No.  Cardiac:  Arrhythmia, MI, angioplasty and stent in 2004 and 2007.  Hypertension:  Yes.  Smoking:  No.  Previous Surgery:  Right carotid endarterectomy with Dacron patch  angioplasty on 04/17/2006 by Dr. Hart Rochester.  CV History:  No.  Amaurosis Fugax No, Paresthesias No, Hemiparesis No                                       RIGHT             LEFT  Brachial systolic pressure:         108               100  Brachial Doppler waveforms:         WNL               WNL  Vertebral direction of flow:        Antegrade         Antegrade  DUPLEX VELOCITIES (cm/sec)  CCA peak systolic                   77                72  ECA peak systolic                   128               251  ICA peak systolic                   74                177  ICA end diastolic                   29                77  PLAQUE MORPHOLOGY:                  Intimal thickening                  Heterogeneous  PLAQUE AMOUNT:                      Minimal           Moderate  PLAQUE LOCATION:                    Bif/ICA           Bif/ICA/ECA   IMPRESSION:  1. Patent right internal carotid artery with intimal thickening with      no evidence of restenosis.  2. 60-79% left internal carotid artery stenosis.  3. Left external carotid artery stenosis.      ___________________________________________  Quita Skye Hart Rochester, M.D.   AC/MEDQ  D:  02/03/2009  T:  02/04/2009  Job:  [id number]

## 2010-11-09 NOTE — Assessment & Plan Note (Signed)
OFFICE VISIT   Kevin Reeves, Kevin Reeves  DOB:  May 27, 1941                                       02/06/2007  OZHYQ#:[id number]   Kevin Reeves is now 6 months status post right carotid endarterectomy for  an asymptomatic superior right internal carotid stenosis.  He has done  very well since his surgery with no neurologic complications or  symptoms.  He has been taking aspirin and Plavix on a daily basis.  He  also denies any lower extremity claudication symptoms and has no chest  pain, dyspnea on exertion, PND, or orthopnea.   PHYSICAL EXAMINATION:  Blood pressure is 103/65, heart rate is 67,  respirations are 18.  His carotid incision on the right is well healed.  He has 3+ pulses with a soft bruit on the left.  Neurologic:  Exam is  normal.  Chest:  Clear to auscultation.  Cardiovascular:  Exam reveals a  regular rhythm with no murmurs.  Abdomen:  Soft, nontender, with no  palpable masses.  He has 3+ femoral, 2+ popliteal, and 2+ dorsalis pedis  pulses palpable bilaterally.   Carotid duplex exam today reveals widely patent right carotid  endarterectomy site and an approximate 50% left internal carotid  stenosis.   He is asymptomatic.  Will continue to follow him on a regular basis for  his contralateral lesion on the carotid protocol.  He will return in 6  months for this study.   Kevin Reeves, M.D.  Electronically Signed   JDL/MEDQ  D:  02/06/2007  T:  02/08/2007  Job:  [id number]   cc:   Farris Has. Dorethea Clan, MD

## 2010-11-09 NOTE — Letter (Signed)
July 17, 2008    [name], MD  [address], [address]  [place], [place] [address]   RE:  Kevin Reeves, PARCEL  MRN:  [id number]  /  DOB:  1941/01/26   Dear Loraine Leriche,   Mr. Hylton returns to the office for continued assessment and treatment  of coronary disease, cerebrovascular disease and cardiovascular risk  factors.  He has previously been followed by Dr. Dorethea Clan and Dr. Eden Emms,  and now transfers to my practice.  As you know, he has done very well  since stenting of a ramus intermedius coronary in 2004.  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  Control of hypertension and hyperlipidemia have been excellent.  He has  not been a cigarette smoker.   CURRENT MEDICATIONS:  1. Aspirin 325 mg daily.  2. Clopidogrel 75 mg daily.  3. Vytorin 10/80 mg daily.  4. Ramipril 10 mg daily.  5. Metoprolol succinate 25 mg daily.  6. Multivitamin.   He has nitroglycerin, but does not use it.  He wonders whether he would  be permitted to use Viagra.   PHYSICAL EXAMINATION:  GENERAL:  Very pleasant gentleman in no acute  distress.  VITAL SIGNS:  The weight is 199, 8 pounds more than last year, blood  pressure 115/75, heart rate 65 and regular, respirations 14.  NECK:  No  jugular venous distention; right carotid endarterectomy scar; faint left  carotid bruit.  LUNGS:  Clear.  CARDIAC:  Normal first and second heart sounds; fourth heart sound  present.  ABDOMEN:  Soft and nontender; no organomegaly.  EXTREMITIES:  Distal pulses intact; minimal, if any, edema.   EKG:  Normal sinus rhythm; within normal limits.  No change since a  previous tracing of October 11, 2006.   IMPRESSION:  Mr. Friesen is doing beautifully.  We will stop Vytorin  after his current prescription has been exhausted and start simvastatin  80 mg daily per your suggestion.  A fasting lipid profile will be  obtained in  6 weeks.  He is free to use Viagra as long as he does not  utilize nitroglycerin within 24 hours of taking the pill.  The use of  Viagra was explained to him and his wife.  I will reassess this nice  gentleman in 1 year.    Sincerely,      Gerrit Friends. Dietrich Pates, MD, [facility]  Electronically Signed    RMR/MedQ  DD: 07/17/2008  DT: 07/18/2008  Job #: [id number]

## 2010-11-12 NOTE — Procedures (Signed)
NAMEHUNT, ZAJICEK               ACCOUNT NO.:  [id number]   MEDICAL RECORD NO.:  [id number]          PATIENT TYPE:  EMS   LOCATION:  ED                            FACILITY:  [facility]   PHYSICIAN:  Edward L. Juanetta Gosling, M.D.DATE OF BIRTH:  [birth year]   DATE OF PROCEDURE:  DATE OF DISCHARGE:  10/16/2005                                EKG INTERPRETATION   October 16, 2005, 1325   The rhythm is sinus rhythm with a bradycardic rate in the 40s. ST depression  is seen inferiorly. There is some ST elevation still laterally in the chest  leads which could indicate infarction and clinical correlation suggested.  Abnormal electrocardiogram.      Oneal Deputy. Juanetta Gosling, M.D.  Electronically Signed     ELH/MEDQ  D:  10/17/2005  T:  10/18/2005  Job:  [id number]

## 2010-11-12 NOTE — Discharge Summary (Signed)
Kevin Reeves               ACCOUNT NO.:  [id number]   MEDICAL RECORD NO.:  [id number]          PATIENT TYPE:  INP   LOCATION:  2024                         FACILITY:  [facility]   PHYSICIAN:  Charlies Constable, M.D. [facility] DATE OF BIRTH:  1940/09/16   DATE OF ADMISSION:  10/16/2005  DATE OF DISCHARGE:  10/18/2005                                 DISCHARGE SUMMARY   PRIMARY CARDIOLOGIST:  Dr. Vida Roller   PRIMARY CARE PHYSICIAN:  Dr. [name]   PRINCIPAL DIAGNOSIS:  Acute posterior wall myocardial infarction.   OTHER DIAGNOSES:  1.  Coronary artery disease.  2.  Ischemic cardiomyopathy, ejection fraction 40-45%.  3.  Hyperlipidemia.  4.  Hypertension.  5.  Bilateral asymptomatic carotid bruits.   ALLERGIES:  No known drug allergies.   PROCEDURES:  Left heart cardiac catheterization with PCI and thrombectomy to  the ramus intermedius within a previously placed stent.   HISTORY OF PRESENT ILLNESS:  70 year old white male with prior history of  CAD status post TAXUS drug-eluting stent placement to the ramus intermedius  May 30, 2003.  He was in his usual state of health until 12:30 p.m. on  April 22 when he had sudden onset of exertional chest pain.  He promptly  presented to [facility] where EKG was initially without changes,  but approximately 35 minutes later showed ST segment depression in leads V1-  V3 consistent with posterior MI.  He also developed bradycardia with  occasional junctional escape beats.  Decision was made to transport him to  [facility] for further evaluation.   HOSPITAL COURSE:  He arrived at [facility] at  1510 and catheterization was performed revealing total occlusion of the  proximal ramus intermedius in the previously placed TAXUS drug-eluting  stent.  Dr. Juanda Chance then performed thrombectomy and PTCA as well as  intravascular ultrasound.  No additional stents were placed.  The patient  tolerated this  procedure well and post procedure bumped his CK to 1333, MB  to 217.4, and troponin I to 61.7.  He was monitored in stepdown for 24 hours  and had no additional chest pain or shortness of breath.  He was transferred  out to the floor where he worked with cardiac rehabilitation and was  ambulating without difficulty or recurrent symptoms.  Secondary to an EF of  40-45% ACE inhibitor was added to his current regimen which he has been  tolerating.  He has been noted to have asymptomatic carotid bruits on  physical examination and he has been set up for outpatient carotid  ultrasound.  He otherwise has been doing well and is being discharged home  today in satisfactory condition.   DISCHARGE LABORATORIES:  Hemoglobin 12.9, hematocrit 37.5, WBC 7.6,  platelets 237, MCV 91.1.  Sodium 139, potassium 4, chloride 109, CO2 26, BUN  13, creatinine 1.2, glucose 106.  Total bilirubin 0.5, alkaline phosphatase  40, AST 66, ALT 31, albumin 3.3.  Peak CK 1333, peak MB 217.4, peak troponin  I 61.7.  Total cholesterol 128, triglycerides 112, HDL 37, LDL 69.  Calcium  8.8.  BNP 52.  C-reactive protein 0.3.  Hemoglobin A1c 5.9.   DISPOSITION:  Patient is being discharged home today in good condition.   FOLLOW-UP PLANS AND APPOINTMENTS:  He has a follow-up appointment with Dr.  Vida Roller in [place] on May 9 at 1:30 p.m.  He is asked to follow up  with Dr. Nobie Putnam in approximately three to four weeks.  He will have a  follow-up bilateral carotid ultrasound at [facility] in [place]  on May 1 at 10:30 a.m.   DISCHARGE MEDICATIONS:  1.  Aspirin 325 mg daily.  2.  Plavix 75 mg daily.  3.  Toprol XL 25 mg daily.  4.  Altace 10 mg daily.  5.  Vytorin 10/40 mg q.h.s.  6.  Multivitamin one daily.  7.  Nitroglycerin 0.4 mg sublingual p.r.n. chest pain.   OUTSTANDING LABORATORY STUDIES:  None.   DURATION OF DISCHARGE ENCOUNTER:  40 minutes including physician time.      Ok Anis, NP    ______________________________  Charlies Constable, M.D. [facility]    CRB/MEDQ  D:  10/18/2005  T:  10/19/2005  Job:  [id number]   cc:   [name], M.D.  Fax: [phone]

## 2010-11-12 NOTE — Cardiovascular Report (Signed)
NAME:  Kevin Reeves, Kevin Reeves                         ACCOUNT NO.:  [id number]   MEDICAL RECORD NO.:  [id number]                   PATIENT TYPE:  INP   LOCATION:  6524                                 FACILITY:  [facility]   PHYSICIAN:  Salvadore Farber, M.D.             DATE OF BIRTH:  Sep 19, 1940   DATE OF PROCEDURE:  05/30/2003  DATE OF DISCHARGE:                              CARDIAC CATHETERIZATION   PROCEDURE:  Stent to the ramus intermedius.   INDICATIONS:  Kevin Reeves is a 70 year old gentleman who presented with chest  discomfort and shortness of breath with exertion which has occurred over the  past year, but with progressively less exertion.  He underwent cardiac  catheterization on December 1 by Kevin Reeves. Kevin Reeves, M.D.  This demonstrated  moderate disease of the LAD, 90% stenosis of the proximal portion of a large  ramus intermedius, 70% stenosis of a small obtuse marginal branch.  Ejection  fraction was preserved.  After discussion, decision was made to proceed with  PCI of the ramus.   PROCEDURAL TECHNIQUE:  Informed consent was obtained.  The patient consented  to participate in the SEPIA trial of anticoagulation in low risk coronary  intervention.   Under 1% lidocaine local anesthesia a 6-French sheath was placed in the  right femoral artery using the modified Seldinger technique.  SEPIA study  drug was administered.  Blinded ACT was reported to be 300-350.  A CLS3.5  guide was advanced over a wire and engaged in the ostium of the left main.  A marker wire was advanced into the ramus intermedius.  The lesion was then  directly stented using a 2.5 x 24 mm Taxus stent deployed at 14 atmospheres.  The stent was then post dilated using a 2.5 mm PowerSail at 16 atmospheres  distally and 14 atmospheres proximally.  Final angiogram demonstrated no  residual stenosis and TIMI 3 flow to the distal vasculature.   IMPRESSION/RECOMMENDATION:  The patient will be continued on aspirin  indefinitely and Plavix for nine months.  Statin will be continued with a  goal LDL of less than 80.  ACT and sheath management will be per the SEPIA  study protocol.                                               Salvadore Farber, M.D.    WED/MEDQ  D:  05/30/2003  T:  05/31/2003  Job:  [id number]   cc:   Vida Roller, M.D.  Fax: [phone]   [name], M.D.  [address]  [place]  [place] [address]  Fax: [phone]

## 2010-11-12 NOTE — Cardiovascular Report (Signed)
NAME:  ZAIDEN, LUDLUM                         ACCOUNT NO.:  [id number]   MEDICAL RECORD NO.:  [id number]                   PATIENT TYPE:  OIB   LOCATION:  3706                                 FACILITY:  [facility]   PHYSICIAN:  Arturo Morton. Riley Kill, M.D.             DATE OF BIRTH:  [birth year]   DATE OF PROCEDURE:  05/28/2003  DATE OF DISCHARGE:                              CARDIAC CATHETERIZATION   INDICATIONS:  Mr. Tosi is a 70 year old gentleman who presents with  exertional chest discomfort.  He has an anterolateral defect on radionuclide  imaging.  He also has an apical defect.  The current study was done to  assess coronary anatomy.  Risks, benefits, and alternatives were discussed  with the patient and he was agreeable to proceed.   PROCEDURE:  1. Left heart catheterization.  2. Selective coronary arteriography.  3. Selective left ventriculography.   DESCRIPTION OF PROCEDURE:  The procedure was performed from the femoral  artery using 6-French catheters.  We used a Smart needle.  He tolerated  procedure well.  There were no complications.   HEMODYNAMIC DATA:  1. Central aorta 141/82, mean 106.  2. Left ventricle 111/7.  3. No gradient on pullback across the aortic valve.   ANGIOGRAPHIC DATA:  1. Left ventriculography revealed mild anterolateral hypokinesis.  Ejection     fraction was calculated at 62%.  No significant mitral regurgitation was     noted.  2. The left main coronary artery was free of critical disease.  3. The LAD courses to the apex.  In the mid portion of the proximal vessel     is about a 50-60% area of mild segmental abnormality.  It does not appear     to be high grade with a residual lumen of just over 2 and yet there is     clear cut evidence of disease.  There is a small first diagonal which has     about 90% ostial narrowing.  This vessel is not suitable for grafting.     There is mild segmental disease in the proximal mid vessel and then the  vessel opens up into a large caliber distal LAD.  4. The circumflex provides a first marginal branch.  This first marginal     branch has proximal 30% narrowing, then a segmental area of a 90%     narrowing.  It divides distally and bifurcates.  There is a somewhat     smaller second marginal branch which has a 70-80% ostial stenosis.  The     remainder of the circumflex which includes a third marginal and an AV     circumflex is without critical disease.  5. The right coronary artery has about 20% proximal narrowing.  There is     mild luminal irregularity in the distal vessel, then about a 30% area of     distal focal disease leading into the second  posterolateral branch.   CONCLUSIONS:  1. Preserved overall left ventricular function with mild anterolateral wall     hypokinesis.  2. Moderate stenosis of the left anterior descending with a high grade     stenosis of a small diagonal.  3. High grade stenosis of the first obtuse marginal as described above.  4. Moderately severe stenosis of the ostium of the second obtuse marginal.  5. Mild irregularity of the right coronary artery as described above.   DISPOSITION:  Plan to review the films with my colleagues.  This is somewhat  difficult due to the ostial disease of the OM2 which is not favorable for  intervention.  However, the right is not critically diseased and the LAD is  very approachable from the standpoint of percutaneous intervention should  this be necessary.  I will discuss the options with my colleagues and review  the films.  Should there be a problem we will then make a final treatment  plan.                                               Arturo Morton. Riley Kill, M.D.    TDS/MEDQ  D:  05/28/2003  T:  05/29/2003  Job:  [id number]   cc:   [name], M.D.  [address], [address]  [place]  [place] [address]  Fax: [phone]   Vida Roller, M.D.  Fax: [phone]   [facility]

## 2010-11-12 NOTE — Procedures (Signed)
NAMEKAVONTAE, PRITCHARD               ACCOUNT NO.:  [id number]   MEDICAL RECORD NO.:  [id number]         PATIENT TYPE:  REC   LOCATION:                                FACILITY:  [facility]   PHYSICIAN:  Cecil Cranker, M.D.DATE OF BIRTH:  05/01/1941   DATE OF PROCEDURE:  02/02/2005  DATE OF DISCHARGE:                                    STRESS TEST   INDICATIONS FOR PROCEDURE:  Mr. Arnett is 69 year old gentleman with past  medical history significant for coronary disease, status post stent to his  ramus intermedius in December 2004.  At that time, he had residual coronary  disease including a 90% first obtuse marginal, 70-80% second obtuse marginal  50-60% LAD and 20-30% RCA lesion with normal EF at that time.  He denies any  chest discomfort or shortness of breath at this time.  It is time for  routine examination.   BASELINE DATA:  Electrocardiogram reveals sinus bradycardia at 50 beats per  minute with nonspecific ST abnormalities.  Blood pressure is 128/70.   The patient exercised for a total of 9 minutes to Bruce protocol stage III  and 10.1 METS.  Maximum heart rate was 147 and 94% of predicted.  Maximum  blood pressure is 168/80 and resolved down to 112/68 in recovery.  The  patient reports some shortness of breath at the end of exercise.  No chest  discomfort was noted.   Electrocardiogram revealed frequent PVCs.  They were multifocal.  He had ST  depression in inferolateral leads that resolved approximately 10 minutes  into recovery.   Final images and results are pending M.D. review.  The patient has been  instructed to use nitroglycerin as needed for shortness of breath and chest  discomfort.  He has also instructed to decrease his activity level until he  is seen in followup by Dr. Dorethea Clan.      Amy B   AB/MEDQ  D:  02/02/2005  T:  02/02/2005  Job:  [id number]

## 2010-11-12 NOTE — Op Note (Signed)
[facility]  Patient:    Kevin Reeves, Kevin Reeves Visit Number: [id number] MRN: [id number]          Service Type: END Location: DAY Attending Physician:  Jonathon Bellows Dictated by:   Roetta Sessions, M.D. Proc. Date: 08/22/01 Admit Date:  08/22/2001   CC:         [name], M.D.   Operative Report  PROCEDURE:  Colonoscopy and biopsy.  INDICATIONS FOR PROCEDURE:  The patient is a 70 year old gentleman who returns for surveillance colonoscopy.  On August 15, 1997 he underwent colonoscopy and had a rectosigmoid polyp which was resected and turned out to be an adenoma and left-sided diverticula.  He has done fine since that time.  He has no GI symptoms.  He comes for surveillance.  This procedure has been discussed with him at the bedside and previously.  Please see my August 15, 2001 H&P for more information.  DESCRIPTION OF PROCEDURE:  Oxygen saturation, blood pressure, pulse and respirations were monitored throughout the entire procedure.  Conscious sedation:  Versed 3 mg IV, Demerol 75 mg IV in divided doses.  Instrument: Olympus videochip colonoscope.  FINDINGS:  Digital rectal examination revealed no abnormalities.  ENDOSCOPIC FINDINGS:  Prep was good.  RECTAL:  Examination of the rectal mucosa including retroflexed view  of the anal verge revealed only a 3 mm polyp at the rectosigmoid junction.  This was cold biopsy/removed.  COLON:  The colonic mucosa was surveyed from the rectosigmoid junction through the left, transverse and right colon to the appendiceal orifice, ileocecal valve and cecum.  These structures were well seen and photographed.  The patient had left-sided diverticula at 30 cm.  There were two 3 mm polyps which were cold biopsied/removed.  The ileocecal valve, cecum and appendiceal orifice were photographed for the record.   From this level the scope was slowly withdrawn and all previously mentioned mucosal surfaces were  again seen.  Again, no other abnormalities were observed.  The patient tolerated the procedure well and was reactive to endoscopy.  IMPRESSION: 1. Diminutive rectosigmoid and sigmoid polyps, cold biopsy/removed as    described above. 2. Left-sided diverticula. 3. The remainder of the rectum and colon appeared normal.  RECOMMENDATIONS: 1. Follow-up on pathology. 2. Further recommendations to follow. Dictated by:   Roetta Sessions, M.D. Attending Physician:  Jonathon Bellows DD:  08/22/01 TD:  08/22/01 Job: [id number]

## 2010-11-12 NOTE — Procedures (Signed)
NAME:  Kevin Reeves, Kevin Reeves                         ACCOUNT NO.:  [id number]   MEDICAL RECORD NO.:  [id number]                   PATIENT TYPE:  OUT   LOCATION:  RAD                                  FACILITY:  [facility]   PHYSICIAN:  Vida Roller, M.D.                DATE OF BIRTH:  [birth year]   DATE OF PROCEDURE:  05/19/2003  DATE OF DISCHARGE:                                    STRESS TEST   EXERCISE CARDIOLITE:   INDICATION:  [name] is a 70 year old male with no known coronary artery  disease with typical chest discomfort and cardiac risk factors including  male sex, age, hyperlipidemia, and hypertension.  He does have a history of  normal treadmill test approximately 2 years ago.   BASELINE DATA:  EKG reveals sinus bradycardia at 55 beats per minute with  nonspecific ST abnormalities, blood pressure is 130/88.   Patient exercised for a total of 6 minutes through Bruce protocol stage 2.  Maximal heart rate was 140 beats per minute which is 89% of predicted  maximum.  Maximum blood pressure was 182/80.  This resolved down to 138/80  in recovery.   Patient did describe some chest discomfort during exercise, he described as  a tightness, this resolved quickly in recovery.  EKG revealed ischemic  changes with significant ST depression and T wave inversion in the  inferolateral leads.  This resolved after approximately 15 minutes in  recovery.   Final images and results are pending MD review.  Patient was instructed to  avoid strenuous activity until he receives the results of his test.  He is  also instructed to take nitroglycerin for any prolonged episodes of chest  discomfort and to come to the emergency room if his chest discomfort is not  resolved by the nitroglycerin.     ________________________________________  ___________________________________________  Jae Dire, P.A. [facility]                      Vida Roller, M.D.   AB/MEDQ  D:  05/19/2003  T:  05/19/2003  Job:   [id number]

## 2010-11-12 NOTE — Discharge Summary (Signed)
NAMETHORVALD, ORSINO               ACCOUNT NO.:  [id number]   MEDICAL RECORD NO.:  [id number]          PATIENT TYPE:  OIB   LOCATION:  3315                         FACILITY:  [facility]   PHYSICIAN:  Rowe Clack, P.A.-C. DATE OF BIRTH:  1940/11/28   DATE OF ADMISSION:  04/17/2006  DATE OF DISCHARGE:  04/18/2006                               DISCHARGE SUMMARY   HISTORY OF PRESENT ILLNESS:  The patient is a 70 year old white male.  Recent physical examination by Dr. Nobie Putnam.  He was found to have an  asymptomatic right carotid bruit.  He underwent carotid Doppler studies  at [facility] which revealed a high-grade right internal carotid  artery stenosis.  He denied any symptoms referable to this including  visual changes, TIAs, amaurosis fugax, syncope, presyncope, weakness, or  dysarthria.  He was referred to [facility] for consideration of surgical  revascularization.  Repeat Duplex study was performed at the [facility]  vascular lab which revealed an 80 to 99% stenosis of the right internal  carotid artery and a 60 to 79% stenosis of the left internal carotid  artery.  Because of his history of previous cardiac stents, he had been  on Plavix; and it was recommended that he discontinue this at least  three days prior to surgery.  Dr. Hart Rochester recommended proceeding with  right carotid endarterectomy so as to lower his risk of cerebrovascular  accident, and he was admitted this hospitalization for the procedure.   PAST MEDICAL HISTORY:  1. Hypertension.  2. Hypercholesterolemia.  3. Coronary artery disease status post myocardial infarction in April      2007.   PAST SURGICAL HISTORY:  Previous cardiac stent placements in 2004 and  2007, otherwise unremarkable.   MEDICATIONS PRIOR TO ADMISSION:  1. Plavix 75 mg daily and was stopped on April 12, 2006.  2. Aspirin 325 mg daily.  3. Altace 10 mg daily.  4. Vytorin 80 mg daily.  5. Toprol XL 25 mg daily.   ALLERGIES:  No known drug  allergies.   Family history, social history, review of symptoms, and physical  examination, please see the history and physical done on admission.   HOSPITAL COURSE:  The patient was admitted electively and on April 17, 2006 he was taken to the operating room at which time he underwent a  right carotid endarterectomy.  The patient tolerated the procedure well  and was taken to the Postanesthesia Care Unit in a stable condition.   POSTOPERATIVE HOSPITAL COURSE:  The patient has done quite well.  He has  remained neurologically intact.  He has remained hemodynamically stable.  Laboratory values postoperative day number one were stable with  hematocrit 37, hemoglobin 12, BUN and creatinine 11 and 1.0,  respectively.  His incision showed no signs of hematoma or drainage or  bleeding.  He tolerated routine activity progression.  All routine  lines, monitors, and drains devices were discontinued in the standard  fashion.  Overall, his status was felt to be quite stable for discharge  on April 18, 2006.   CONDITION ON DISCHARGE:  Stable and improved.  FINAL DIAGNOSES:  Severe right internal carotid artery stenosis now  status post revascularization as described.  Other diagnoses as  previously listed per the history.   INSTRUCTIONS:  The patient received written instructions regarding  medications, activity, diet, wound care, and the followup.   Medications on discharge were as preoperatively.  Additionally for pain,  Tylox 1 or 2 every four to six hours p.r.n.      Rowe Clack, P.A.-C.     Sherryll Burger  D:  05/24/2006  T:  05/25/2006  Job:  [id number]   cc:   Quita Skye. Hart Rochester, M.D.  [name], M.D.  Farris Has. Dorethea Clan, MD

## 2010-11-12 NOTE — Assessment & Plan Note (Signed)
[facility]                            CARDIOLOGY OFFICE NOTE   Kevin, Reeves                      MRN:          [id number]  DATE:10/11/2006                            DOB:          May 30, 1941    CARDIOLOGIST:  He used to be followed by Dr. Vida Reeves in  [place].   PRIMARY CARE PHYSICIAN:  Dr. Patrica Reeves.   HISTORY OF PRESENT ILLNESS:  [name] is a very pleasant 70 year old  male patient with a history of coronary artery disease status post TAXUS  drug-eluting stent placement to the ramus intermedius in December of  2004, who suffered an acute posterior myocardial infarction in April of  2007 secondary to stent thrombosis, treated with thrombectomy and  angioplasty by Dr. Juanda Reeves.  He presents back to the office today for  followup as part of the HORIZONS trial.  He denies chest pain or  shortness of breath.  Denies any syncope or near-syncope.  He is able to  perform activities of daily living without limitation.  He denies  significant dyspnea on exertion.   CURRENT MEDICATIONS:  1. Aspirin 325 mg daily.  2. Toprol XL 25 mg daily.  3. Multivitamin.  4. Vytorin 10/80 mg daily.  5. Altace 10 mg daily.  6. Plavix 75 mg daily.   ALLERGIES:  No known drug allergies.   PHYSICAL EXAM:  He is a well-developed, well-nourished male in no acute  distress.  Blood pressure 117/76, pulse 81, weight 192 pounds.  HEENT:  Unremarkable.  NECK:  Without JVD.  LYMPHATICS:  Without lymphadenopathy.  CARDIAC:  Normal S1, S2.  Regular rate and rhythm without murmurs.  LUNGS:  Clear to auscultation bilaterally without wheeze, rales, or  rhonchi.  ABDOMEN:  Soft and nontender with normoactive bowel sounds.  No  organomegaly.  EXTREMITIES:  Without edema.  Calves soft and nontender.  SKIN:  Warm and dry.  NEUROLOGIC:  He is alert and oriented x3.  Cranial nerves 2-12 are  grossly intact.   ELECTROCARDIOGRAM:  Reveals sinus rhythm with a  heart rate of 75.  No  acute changes.   IMPRESSION:  1. Coronary artery disease.      a.     Status post TAXUS drug-eluting stent placement to the ramus       branch of the circumflex in 2004.      b.     Status post acute posterior wall myocardial infarction in       April of 2007 secondary to stent thrombosis, treated with       thrombectomy and angioplasty.      c.     Cardiac catheterization in April of 2007:  Obtuse marginal       1, ostial 70% stenosis, proximal left anterior descending 70%,       large diagonal branch with 90%, mid right coronary artery 30%.  2. Ischemic cardiomyopathy with an ejection fraction of 40-45% at the      time of this cardiac catheterization in April of 2007.  3. Hyperlipidemia.  4. Hypertension.  5. Cerebrovascular disease status post  right carotid endarterectomy in      October 2007.      a.     Residual left internal carotid artery stenosis at 60-79%.  6. HORIZON trial.   PLAN:  The patient presents to the office today for followup for the  HORIZON study protocol.  He is doing well without any chest pain or  shortness of breath.  He has not had followup in the [facility] office  since Dr. Dorethea Reeves left.  We will make sure that he has routine followup  set up in the next several months.  He will follow up with the research  staff as indicated.      Kevin Newcomer, PA-C  Electronically Signed      Kevin Reeves. Kevin Chance, MD, [facility]  Electronically Signed   SW/MedQ  DD: 10/11/2006  DT: 10/11/2006  Job #: [id number]   cc:   Kevin Reeves, M.D.

## 2010-11-12 NOTE — H&P (Signed)
NAMEHARRINGTON, Kevin Reeves                ACCOUNT NO.:  [id number]   MEDICAL RECORD NO.:  [id number]           PATIENT TYPE:   LOCATION:                                 FACILITY:   PHYSICIAN:  Quita Skye. Hart Rochester, M.D.       DATE OF BIRTH:   DATE OF ADMISSION:  DATE OF DISCHARGE:                                HISTORY & PHYSICAL   REASON FOR ADMISSION:  Right internal carotid artery stenosis.   HISTORY OF PRESENT ILLNESS:  The patient is a 70 year old white male who is  a patient of Dr. [name].  He was found on a recent physical exam to  have an asymptomatic right carotid bruit.  He underwent carotid Doppler  studies at [facility] which revealed a high-grade right internal  carotid artery stenosis.  He denies any symptoms referable to this including  visual changes, TIAs, amaurosis fugax, syncope, presyncope, weakness,  dysarthria.  He was referred to [facility] for consideration of surgical  revascularization.  A repeat duplex study was performed in our vascular lab  which showed an 80-99% stenosis of the right internal carotid artery, a 60-  79% stenosis of the left internal carotid artery.  Because of his history of  previous cardiac stents.  He has been on Plavix and it was recommended that  he discontinue this at least 3 days prior to surgery.  Dr. Hart Rochester  recommended proceeding with a right carotid endarterectomy in order to  decrease his risk of stroke; and after explanation of the risks, benefits  and alternatives of the surgery.  The patient agreed to proceed.   PAST MEDICAL HISTORY:  1. Hypertension.  2. Hypercholesterolemia.  3. Coronary artery disease status post myocardial infarction in April      2007.   PAST SURGICAL HISTORY:  None except cardiac stent placements in 2004 in  2007.   MEDICATIONS:  1. Plavix 75 mg daily last taken on 10/17.  2. Aspirin 325 mg daily.  3. Altace 10 mg daily.  4. Vytorin 80 mg daily.  5. Toprol XL 25 mg daily.   ALLERGIES:  No  known drug allergies.   SOCIAL HISTORY:  He is married and has three children.  He resides with his  wife in [place].  He has never consumed alcohol or used tobacco products.  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   FAMILY HISTORY:  His mother is alive and well at age 75.  His father is  deceased and had coronary artery disease and a myocardial infarction.  He  has three brothers one of whom has coronary artery disease and one sister.  The rest of his siblings have no chronic medical problems of which he is  aware.  There is a strong family history, on his father's side of the  family, of coronary artery disease in the males.   REVIEW OF SYSTEMS:  See history of present illness for pertinent positives  and negatives.  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  He  denies fevers, chills, weight loss, recent infections.  NEURO:  Changes as  above, shortness of breath, cough, wheezing orthopnea, PND, hemoptysis,  chest pain, heart palpitations, irregular heart rhythm, abdominal pain,  nausea, vomiting, diarrhea, constipation, reflux symptoms, hematochezia,  melena, hematuria, dysuria, or nocturia.  Lower extremity claudication  symptoms rest pain, nonhealing lower extremity ulcers, lower extremity  edema, muscle or joint problems aside from those previously mentioned,  intolerance to heat or cold, anxiety, depression or other psychiatric  illness.   PHYSICAL EXAM:  Blood pressure 128/84, heart rate 80 and regular,  respirations 18 and unlabored.  GENERAL:  This is a well-developed, well-nourished white male in no acute  distress.  HEENT: Normocephalic, atraumatic.  Pupils equal, round and react to light  accommodation.  Extraocular movements intact.  TMs and canals are clear.  Nares patent bilaterally.  Oropharynx is clear.  NECK:  Supple without lymphadenopathy or thyromegaly.   There are soft  bilateral carotid bruits, right greater than left.  HEART:  Regular rate and rhythm without murmurs, rubs or gallops.  LUNGS: Clear to auscultation.  ABDOMEN: Soft, nontender, nondistended without masses or hepatosplenomegaly.  There are active bowel sounds in all quadrants.  EXTREMITIES: No clubbing, cyanosis or edema.  He has 2+ femoral pulses with  1+ pedal pulses on the right, and no palpable pedal pulses on the left.  NEURO:  Cranial nerves II-XII grossly intact.  He is alert and oriented x3.  His gait is within normal limits.  He has 5+ upper and lower extremity  strength which is symmetrical.   ASSESSMENT AND PLAN:  This is a 70 year old male with an asymptomatic severe  right internal carotid artery stenosis.  He will be admitted to [facility] on 04/17/2006 and undergo a right carotid endarterectomy by Dr. Jerilee Field.      Kevin Reeves, P.A.    ______________________________  Quita Skye Hart Rochester, M.D.    GC/MEDQ  D:  04/13/2006  T:  04/14/2006  Job:  [id number]   cc:   [name], M.D.  Farris Has. Dorethea Clan, MD

## 2010-11-12 NOTE — H&P (Signed)
NAMESHEILA, GERVASI               ACCOUNT NO.:  [id number]   MEDICAL RECORD NO.:  [id number]          PATIENT TYPE:  INP   LOCATION:  2920                         FACILITY:  [facility]   PHYSICIAN:  Jesse Sans. Wall, M.D.   DATE OF BIRTH:  August 14, 1940   DATE OF ADMISSION:  10/16/2005  DATE OF DISCHARGE:                                HISTORY & PHYSICAL   CHIEF COMPLAINT:  Chest discomfort like my previous angina while doing  housework.   Mr. Gillian is a 70 year old married white male from [place] who has known  CAD.  Exertional angina in December 2004.  He had a high-grade 90% ramus  intermedius lesion that was opened with a TAXUS stent by Dr. Geralynn Rile.  He has done well.  He follows with Dr. Dionicio Stall.   Today around quarter to 1, he began to have symptom onset.  His wife drove  him straight to the emergency room.  First EKG showed no changes.  The  second EKG about 35 minutes later showed a posterior infarct with ST segment  depression in V1 through V3. Dr. Colon Branch immediately contacted [place].  [facility]  was not available. He was transported by [facility].   On arrival, he was pain free. He had some junctional bradycardia and had  received intravenous heparin, aspirin, and was on nitroglycerin.  We turned  his nitroglycerin off.  He is undergoing emergency catheterization right now  by Dr. Charlies Constable.  [facility] is  involved.   PAST MEDICAL HISTORY:  He is currently taking Vitorin at an unknown dose  nightly, Toprol XL unknown dose daily, and aspirin 325 a day.   He reports no known allergies.   Previous history is unremarkable.   SOCIAL HISTORY:  He is married and lives in [place].  Refer to the  previous H&P and the medical record for the rest of the social history.   FAMILY HISTORY:  See previous H&P.   REVIEW OF SYSTEMS:  Noncontributory.  He did not report recently stopping  Plavix.  He is off Plavix for almost a year or  more.   PHYSICAL EXAMINATION:  VITAL SIGNS: Blood pressure 140/80, pulse 48 in sinus  bradycardia with some junctional escape beats.  Respiratory rate is 14.  He  is afebrile.  O2 saturation 100%.  GENERAL:  His skin color is good.  He is in no acute distress.  Skin is warm  and dry.  HEENT:  Extraocular movements intact.  PERRLA.  NECK: Carotid upstrokes are equal bilaterally without bruits.  No JVD.  Thyroid is not enlarged.  Trachea is midline.  HEART:  Regular rate and rhythm without murmur.  LUNGS:  Clear.  ABDOMEN: Soft with good bowel sounds.  No midline bruits, no hepatomegaly.  EXTREMITIES:  Femoral pulses are 2+/4+.  There are no bruits. Extremities  reveal dorsalis pedis pulses to be 1+/4+, posterior tibial 2+/4+.  There is  no edema.  NEUROLOGIC:  Exam is grossly intact.   LABORATORY DATA:  CBC is normal. We are waiting on his chemistry profile  from [facility].  His first point-of-care markers were negative.   Chest x-ray is pending at [facility] as well.   ASSESSMENT:  1.  Acute posterior wall infarct with less than 2-1/2 hours from symptom      onset.  2.  Known coronary artery disease status post TAXUS stent to ramus      intermedius December 2004.  3.  Hyperlipidemia.   PLAN:  Emergency catheterization by Dr. Charlies Constable.  The patient  understands the indications and risks and agrees to proceed.  [facility] [employer] nurse is notified.      Thomas C. Wall, M.D.  Electronically Signed     TCW/MEDQ  D:  10/16/2005  T:  10/16/2005  Job:  [id number]   cc:   [name], M.D.  Fax: [phone]   Vida Roller, M.D.  Fax: [phone]

## 2010-11-12 NOTE — Op Note (Signed)
Kevin Reeves, Kevin Reeves               ACCOUNT NO.:  [id number]   MEDICAL RECORD NO.:  [id number]          PATIENT TYPE:  OIB   LOCATION:  3315                         FACILITY:  [facility]   PHYSICIAN:  Quita Skye. Hart Rochester, M.D.  DATE OF BIRTH:  1940-09-03   DATE OF PROCEDURE:  04/17/2006  DATE OF DISCHARGE:                                 OPERATIVE REPORT   PREOPERATIVE DIAGNOSIS:  Severe right internal carotid stenosis --  asymptomatic.   POSTOPERATIVE DIAGNOSES:  Severe right internal carotid stenosis --  asymptomatic.   OPERATION:  Right carotid endarterectomy with Dacron patch angioplasty.   SURGEON:  Quita Skye. Hart Rochester, M.D.   FIRST ASSISTANT:  Nurse.   ANESTHESIA:  General endotracheal.   BRIEF HISTORY:  This patient was found to have a severe right internal  carotid stenosis by duplex scanning with 80% to 90% severity on the right  and 60% to 70% severity on the left.  He denied any neurologic symptoms in  the past and had a cardiac intervention performed in April of 2007 and at  this point in time, his Plavix has been discontinued for a few days to  proceed with an elective right carotid endarterectomy.   PROCEDURE:  The patient was taken to the operating room and placed in a  supine position, at which time satisfactory general endotracheal anesthesia  was administered.  The right neck was prepped with Betadine scrub and  solution and draped in routine sterile manner.  An incision was made along  the anterior border of the sternocleidomastoid muscle and carried down  through subcutaneous tissue and platysma using the Bovie.  The common facial  vein and external jugular vein were ligated with 3-0 silk ties and divided,  exposing the common, internal and external carotid arteries.  Care was taken  not to injure the vagus or hypoglossal nerves, both which were exposed.  There was a calcified atherosclerotic plaque at the carotid bifurcation,  extending up the internal carotid about 2  cm; distal vessel appeared normal,  but was pulseless.  A #10 shunt was prepared and the patient was  heparinized.  The carotid vessels were occluded with vascular clamps and a  longitudinal opening made in the common carotid with a 15 blade and extended  up the internal carotid with the Potts scissors to a point distal to the  disease.  The plaque was about 80% to 90% stenotic in severity.  A #10 shunt  was inserted without difficulty, reestablishing flow in about 2 minutes.  A  standard endarterectomy was then performed using the elevator and the Potts  scissors with an eversion endarterectomy of the external carotid.  The  plaque feathered off the distal internal carotid artery nicely, not  requiring any tacking sutures.  The lumen was thoroughly irrigated with  heparin and saline and all loose debris carefully removed, and the  arteriotomy was closed with a patch using continuous 6-0 Prolene.  Prior to  completion of the closure, the shunt was removed after about 30 minutes of  shunt time.  Following antegrade and retrograde flushing, the closure was  completed, reestablishing flow initially up the external and then up the  internal branch.  The carotid was occluded for less than 2 minutes for  removal of the shunt.  Protamine was  then given to reverse the heparin.  Following adequate hemostasis, the wound  was irrigated with saline, closed in layers with Vicryl in a subcuticular  fashion and sterile dressing applied.  The patient was taken to the recovery  room in satisfactory condition.           ______________________________  Quita Skye Hart Rochester, M.D.     JDL/MEDQ  D:  04/17/2006  T:  04/18/2006  Job:  [id number]

## 2010-11-12 NOTE — Procedures (Signed)
NAMEDORSE, LOCY               ACCOUNT NO.:  [id number]   MEDICAL RECORD NO.:  [id number]          PATIENT TYPE:  EMS   LOCATION:  ED                            FACILITY:  [facility]   PHYSICIAN:  Edward L. Juanetta Gosling, M.D.DATE OF BIRTH:  Oct 28, 1940   DATE OF PROCEDURE:  10/17/2005  DATE OF DISCHARGE:  10/16/2005                                EKG INTERPRETATION   The rhythm is sinus rhythm with a bradycardic rate in the 50s. There is ST  elevation inferiorly and laterally which could indicate an infarction and  clinical correlation is suggested. Abnormal electrocardiogram.      Oneal Deputy. Juanetta Gosling, M.D.  Electronically Signed     ELH/MEDQ  D:  10/17/2005  T:  10/18/2005  Job:  [id number]

## 2010-11-12 NOTE — Discharge Summary (Signed)
NAME:  Kevin Reeves, Kevin Reeves                         ACCOUNT NO.:  [id number]   MEDICAL RECORD NO.:  [id number]                   PATIENT TYPE:  INP   LOCATION:  6524                                 FACILITY:  [facility]   PHYSICIAN:  Arturo Morton. Riley Kill, M.D.             DATE OF BIRTH:  [birth year]   DATE OF ADMISSION:  05/28/2003  DATE OF DISCHARGE:  05/31/2003                           DISCHARGE SUMMARY - REFERRING   PROCEDURES:  Coronary artery stenting December 3.   REASON FOR ADMISSION:  Please refer to dictated admission note.   LABORATORY DATA:  Normal CBC on admission.  Normal metabolic profile on  admission.  Potassium 3.0 at discharge, repeat potassium 4.3.  Normal liver  enzymes.  Normal serial cardiac markers.  Repeat hemoglobin of 32.6 at  discharge.   HOSPITAL COURSE:  The patient presented for elective coronary angiogram  performed by Dr. Arturo Morton. Stuckey.  See report for full details following  an abnormal perfusion study.  The patient had no previous history of  coronary artery disease and was referred to Dr. Vida Roller for  evaluation of chest discomfort in the setting of multiple cardiac risk  factors.   Angiogram notable for moderate two-vessel coronary artery disease with  normal left ventricular function.   Coronary anatomy notable for 90%.   REVIEW OF FILMS:  Dr. Riley Kill recommended proceeding with percutaneous  intervention rather than bypass surgery.  The patient underwent successful  placement of a Taxus stent by Dr. Samule Ohm with treatment of a 90% ramus  intermedius to 0 percent residual stenosis.  The patient was kept for  overnight observation.   The patient was involved in [employer] trial and will need Plavix for the  duration of nine months.   The following morning, the patient was noted to have significant drop in  hemoglobin (10.3).  However, repeat CBC (hemoglobin of 13.6), suggested that  this was a spurious finding secondary to hemodilution.   Additionally,  hypokalemia was also noted, repleted, with a discharge level of 423.   Right groin was stable on examination with no clinical evidence of  pseudoaneurysm or AV fistula.  Following repeat blood work, the patient was cleared for discharge by Dr.  Riley Kill with instructions to follow up with Dr. Vida Roller.   DISCHARGE MEDICATIONS:  1. Plavix 75 mg daily.  2. Aspirin 325 mg daily.  3. Zocor 40 mg daily.  4. Toprol XL 25 mg daily.  5. Nitro-Stat 0.4 mg.   DISCHARGE INSTRUCTIONS:  1. No driving for 48 hours; increase level of activity as tolerated.  2. Maintain wound site clean with soap and water.  3. The patient was instructed to arrange followup with Dr. Vida Roller at     the [facility] within the next two weeks.  4. Arrangements will also be made for continued followup by the [facility]     Reserch Team regarding the [facility] trial.   DISCHARGE  DIAGNOSES:  1. Coronary artery disease.     A. Status post stent, Taxus, 90% ramus intermedius, December 3.     B. Normal left ventricular function.     [facility] trial.  2. Hypokalemia.  3. Sinus tachycardia.  4. History of hypertension.  5. Dyslipidemia.      Gene Serpe, P.A. [facility]                      Thomas D. Riley Kill, M.D.    GS/MEDQ  D:  06/11/2003  T:  06/11/2003  Job:  [id number]   cc:   [name], M.D.  [address], [address]  [place]  [place] [address]  Fax: [phone]

## 2010-11-12 NOTE — Cardiovascular Report (Signed)
NAMEVIC, ESCO               ACCOUNT NO.:  [id number]   MEDICAL RECORD NO.:  [id number]          PATIENT TYPE:  INP   LOCATION:  2024                         FACILITY:  [facility]   PHYSICIAN:  Charlies Constable, M.D. [facility] DATE OF BIRTH:  July 09, 1940   DATE OF PROCEDURE:  10/16/2005  DATE OF DISCHARGE:  10/16/2005                              CARDIAC CATHETERIZATION   PROCEDURE:  Cardiac catheterization and percutaneous coronary intervention.   CLINICAL HISTORY:  Mr. Serano is 70 years old and in 2004 had a 2.5 x 24 mm  Taxus stent placed in the ramus branch of the circumflex artery extending to  the ostium. He had quite well until today when he developed chest pain and  presented to the [facility] emergency room. Initial  EKG was nondiagnostic  but a second ECG showed ST depression in V1-V3 consistent with a posterior  infarction. He was transported promptly to [facility] hospital after being given  aspirin and heparin. He was seen by Dr. Daleen Squibb and arrangements made for  angiography.   PROCEDURE NOTE:  The procedure was performed with a right femoral artery  arterial sheath and 6 French preformed coronary catheters. A femoral wall  arterial puncture was performed and Omnipaque contrast was used. After  completion of the diagnostic study, we made a decision to do an intervention  on the ramus branch of the circumflex artery where the patient had developed  stent thrombosis.   The patient was enrolled in the [employer] trial and was randomized to bivalve  __________. He also received 600 mg of Plavix and 20 of Pepcid. We used CRS  6-French guiding catheter with side holes. We initially attempted to cross  the stent with a Prowater wire, this was unsuccessful. We switched to a  [employer] 4.5 and were able to cross the stent with some difficulty.  We then went in with a diver aspiration catheter and performed three runs  with the aspiration catheter. This reestablished flow distally. There  appeared to be a very small distal embolus at a very terminal small branch.  We then performed an IVUS run with the Atlantis IVUS catheter. This  documented some residual in-stent thrombus and also a smallest minimal stent  diameter of 2.0. The distal reference vessel was 3.0 and the proximal edge  was ostial to the circumflex artery right near the left main. We dilated the  stent with a 3.0 x 20-mm Quantum Maverick performing three inflations up to  16 atmospheres for 30 seconds. Final diagnostic studies was performed of the  guiding catheter. The patient tolerated the procedure well and left the  laboratory in satisfactory condition.   RESULTS:  The aortic pressure was 122/62 with mean of 89 and left  ventricular pressure was 122/20.   LEFT MAIN CORONARY ARTERY:  The left main coronary artery was free of  disease.   LEFT ANTERIOR DESCENDING ARTERY:  The left anterior descending artery gave  rise to a large diagonal branch and two septal perforators. There was 70%  narrowing of  the proximal LAD and 90% stenosis of the ostium of the  diagonal branch.   CIRCUMFLEX ARTERY:  The circumflex artery gave rise to a ramus branch and  two marginal branches and the posterolateral branches. The ramus branch was  completed occluded near its ostium within the stent. There was __________  ostial stenosis of the first marginal branch.   RIGHT CORONARY ARTERY:  The right coronary artery is a moderate size vessel  that gave rise to a right ventricular branch, posterior descending branch  and two posterolateral branches. There was 30% narrowing in the mid right  coronary artery.   LEFT VENTRICULOGRAM:  The left ventriculogram performed in the RAO  projection showed akinesis of the lateral wall. The estimated ejection  fraction was 40-45%.  The left ventriculogram performed in the LAO  projection showed akinesis of the posterior and inferolateral wall. The tip  of the apex and septum moved well and  the superior portion of the posterior  wall moved well.   Following aspiration thrombectomy and PTCA of the in-stent thrombotic lesion  in the ramus branch of the circumflex artery the stenosis improved from 100%  to 0% and the flow improved from TIMI 0 to TIMI 3 flow. Myocardial blush  improved from grade 0 to grade 2.   The patient had the onset of chest pain at 12:35 and arrived at [facility]  emergency room at 12:42. The first ECG was obtained at 12:50 and this was  not diagnostic. The second ECG at 1325 was diagnostic for a posterior  infarction. The patient arrived at the cath lab at [facility] at 1510 and  the reperfusion was established with a diver catheter at 1552. This gave a  __________ time from the time of the diagnostic ECG of 2 hours and 27  minutes and a refusion time of 2 hours and 27 minutes.   CONCLUSION:  1.  Acute posterior wall myocardial infarction due to stent thrombosis      within the stent in the ramus branch of circumflex artery with a total      occlusion of the ramus branch of the circumflex artery, 70% narrowing at      the ostium of the first marginal branch of the circumflex artery, 70%      narrowing of the proximal LAD with 90% narrowing in the large diagonal      branch, 30% narrowing in the mid right coronary and posterior and      inferolateral wall akinesis with an estimated ejection fraction of 40-      45%.  2.  Successful aspiration thrombectomy and PTCA for in-stent thrombosis of      the ramus branch of the circumflex artery with improvement in central      narrowing from 100% to 0%,  improvement of flow from TIMI 0 to TIMI 3      flow.   DISPOSITION:  The patient was returned to the recovery room for further  observation. Will plan to leave the patient om long-term Plavix. Will target  discharge on day two.           ______________________________  Charlies Constable, M.D. [facility]    BB/MEDQ  D:  10/16/2005  T:  10/18/2005  Job:  [id number]    cc:   Vida Roller, M.D.  Fax: [phone]   Jesse Sans. Wall, M.D.  [address]  [address]  [place]  [place] [address]   [name], M.D.  Fax: [phone]

## 2011-02-22 ENCOUNTER — Other Ambulatory Visit: Payer: Self-pay | Admitting: Cardiology

## 2011-06-14 ENCOUNTER — Encounter: Payer: Self-pay | Admitting: Cardiology

## 2011-07-06 ENCOUNTER — Ambulatory Visit: Payer: Self-pay | Admitting: Cardiology

## 2011-07-21 ENCOUNTER — Encounter: Payer: Self-pay | Admitting: Cardiology

## 2011-07-21 ENCOUNTER — Ambulatory Visit (INDEPENDENT_AMBULATORY_CARE_PROVIDER_SITE_OTHER): Payer: Medicare Other | Admitting: Cardiology

## 2011-07-21 DIAGNOSIS — I679 Cerebrovascular disease, unspecified: Secondary | ICD-10-CM | POA: Insufficient documentation

## 2011-07-21 DIAGNOSIS — E782 Mixed hyperlipidemia: Secondary | ICD-10-CM

## 2011-07-21 DIAGNOSIS — I10 Essential (primary) hypertension: Secondary | ICD-10-CM

## 2011-07-21 DIAGNOSIS — E785 Hyperlipidemia, unspecified: Secondary | ICD-10-CM | POA: Insufficient documentation

## 2011-07-21 DIAGNOSIS — I2510 Atherosclerotic heart disease of native coronary artery without angina pectoris: Secondary | ICD-10-CM

## 2011-07-21 DIAGNOSIS — K219 Gastro-esophageal reflux disease without esophagitis: Secondary | ICD-10-CM | POA: Insufficient documentation

## 2011-07-21 NOTE — Assessment & Plan Note (Signed)
Patient continues to do well with respect to coronary disease, having had no manifestations of that condition since stent thrombosis in 2007.  He is to be maintained on dual antiplatelet therapy indefinitely.

## 2011-07-21 NOTE — Assessment & Plan Note (Signed)
Blood pressure control is extremely good; current medication will be continued.

## 2011-07-21 NOTE — Assessment & Plan Note (Addendum)
Lipid profile was excellent one year ago.  Repeat study will be obtained.

## 2011-07-21 NOTE — Progress Notes (Signed)
Patient ID: Kevin Reeves, male   DOB: Nov 15, 1940, 71 y.o.   MRN: [id number] HPI: Scheduled return visit for this delightful gentleman for management of coronary artery disease and cardiovascular risk factors.  Since his last visit one year ago, he has done superbly, not requiring any interval medical care whatsoever.  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  He has had no blood tests over the past year.  Routine health maintenance has been good-he receives influenza vaccine annually, has received the pneumococcal vaccine and herpes zoster vaccination.  Prior to Admission medications   Medication Sig Start Date End Date Taking? Authorizing Provider  aspirin 325 MG EC tablet Take 325 mg by mouth daily.   Yes Historical Provider, MD  clopidogrel (PLAVIX) 75 MG tablet Take 75 mg by mouth daily.     Yes Historical Provider, MD  metoprolol succinate (TOPROL-XL) 25 MG 24 hr tablet Take 25 mg by mouth daily.     Yes Historical Provider, MD  Multiple Vitamin (MULTIVITAMIN) tablet Take 1 tablet by mouth daily.     Yes Historical Provider, MD  NITROSTAT 0.4 MG SL tablet DISSOLVE 1 TABLET UNDER  TONGUE EVERY 5 MINUTES UPTO 15 MIN FOR CHEST PAIN.IF NO RELIEF CALL 911. 02/22/11  Yes Gerrit Friends. Millan Legan, MD  omeprazole (PRILOSEC) 20 MG capsule Take 20 mg by mouth daily.   Yes Historical Provider, MD  ramipril (ALTACE) 10 MG capsule Take 10 mg by mouth daily.     Yes Historical Provider, MD  simvastatin (ZOCOR) 40 MG tablet Take 40 mg by mouth at bedtime.     Yes Historical Provider, MD  No Known Allergies    Past medical history, social history, and family history reviewed and updated.  ROS: Denies orthopnea, PND, pedal edema, exertional dyspnea, palpitations, lightheadedness and syncope.  PHYSICAL EXAM: BP 107/73  Pulse 65  Ht 5\' 9"  (1.753 m)  Wt 87.091 kg (192 lb)  BMI 28.35 kg/m2    General-Well developed; no acute distress Body habitus-proportionate weight and height Neck-No JVD; no carotid bruits; endarterectomy scar on the right Lungs-clear lung fields; resonant to percussion Cardiovascular-normal PMI; split S1 and S2 Abdomen-normal bowel sounds; soft and non-tender without masses or organomegaly Musculoskeletal-No deformities, no cyanosis or clubbing Neurologic-Normal cranial nerves; symmetric strength and tone Skin-Warm, no significant lesions Extremities-distal pulses intact; no edema  ASSESSMENT AND PLAN:   Bing, MD 07/21/2011 1:29 PM

## 2011-07-21 NOTE — Patient Instructions (Signed)
Your physician recommends that you schedule a follow-up appointment in: 1 year  Your physician recommends that you return for lab work in: Within the week   

## 2011-07-21 NOTE — Assessment & Plan Note (Signed)
Cerebrovascular disease has been stable since at least 2008 with mild to moderate stenosis on the left and a widely patent endarterectomy site on the right.  Dr. Hart Rochester continues to follow.

## 2011-07-28 LAB — LIPID PANEL
Cholesterol: 157 mg/dL (ref 0–200)
Total CHOL/HDL Ratio: 4.4 Ratio
VLDL: 40 mg/dL (ref 0–40)

## 2011-07-28 LAB — COMPREHENSIVE METABOLIC PANEL
ALT: 16 U/L (ref 0–53)
AST: 19 U/L (ref 0–37)
Creat: 1.27 mg/dL (ref 0.50–1.35)
Total Bilirubin: 0.4 mg/dL (ref 0.3–1.2)

## 2011-08-01 ENCOUNTER — Encounter: Payer: Self-pay | Admitting: *Deleted

## 2012-02-01 ENCOUNTER — Encounter: Payer: Self-pay | Admitting: Vascular Surgery

## 2012-02-28 ENCOUNTER — Other Ambulatory Visit: Payer: Self-pay | Admitting: *Deleted

## 2012-02-28 DIAGNOSIS — I6529 Occlusion and stenosis of unspecified carotid artery: Secondary | ICD-10-CM

## 2012-02-28 DIAGNOSIS — Z48812 Encounter for surgical aftercare following surgery on the circulatory system: Secondary | ICD-10-CM

## 2012-03-05 ENCOUNTER — Encounter: Payer: Self-pay | Admitting: Neurosurgery

## 2012-03-06 ENCOUNTER — Encounter: Payer: Self-pay | Admitting: Neurosurgery

## 2012-03-06 ENCOUNTER — Ambulatory Visit (INDEPENDENT_AMBULATORY_CARE_PROVIDER_SITE_OTHER): Payer: Medicare Other | Admitting: Vascular Surgery

## 2012-03-06 ENCOUNTER — Ambulatory Visit (INDEPENDENT_AMBULATORY_CARE_PROVIDER_SITE_OTHER): Payer: Medicare Other | Admitting: Neurosurgery

## 2012-03-06 VITALS — BP 121/81 | HR 57 | Resp 16 | Ht 67.0 in | Wt 197.2 lb

## 2012-03-06 DIAGNOSIS — I6529 Occlusion and stenosis of unspecified carotid artery: Secondary | ICD-10-CM

## 2012-03-06 DIAGNOSIS — Z48812 Encounter for surgical aftercare following surgery on the circulatory system: Secondary | ICD-10-CM

## 2012-03-06 NOTE — Progress Notes (Signed)
[name] Carotid Office Note  CC: Annual carotid surveillance Referring Physician: [name]  History of Present Illness: 71 year old male patient of [name] status post right CEA in October 2007. The patient denies any signs or symptoms of CVA, TIA, amaurosis fugax or any neural deficit. The patient denies any new medical diagnoses or recent surgery.  Past Medical History  Diagnosis Date  . Arteriosclerotic cardiovascular disease (ASCVD)     Stent to RI in 2004. Posterior MI in 4/07 with thrombosis of RI at stent site while off Plavix --> reintervention; do not d/c plavix  . Cerebrovascular disease     Left carotid bruit with 40-59% ICA stenosis; right carotid endarectomy in 2008  . Hypertension   . Colonic polyp 1999    Adenoma resected in 1999; history of diverticulosis  . Gastroesophageal reflux disease     Dilation of esophagel stricture in 2008  . Hyperlipidemia   . Cholelithiasis 07/21/2009    Asymptomatic    ROS: [x]  Positive   [ ]  Denies    General: [ ]  Weight loss, [ ]  Fever, [ ]  chills Neurologic: [ ]  Dizziness, [ ]  Blackouts, [ ]  Seizure [ ]  Stroke, [ ]  "Mini stroke", [ ]  Slurred speech, [ ]  Temporary blindness; [ ]  weakness in arms or legs, [ ]  Hoarseness Cardiac: [ ]  Chest pain/pressure, [ ]  Shortness of breath at rest [ ]  Shortness of breath with exertion, [ ]  Atrial fibrillation or irregular heartbeat Vascular: [ ]  Pain in legs with walking, [ ]  Pain in legs at rest, [ ]  Pain in legs at night,  [ ]  Non-healing ulcer, [ ]  Blood clot in vein/DVT,   Pulmonary: [ ]  Home oxygen, [ ]  Productive cough, [ ]  Coughing up blood, [ ]  Asthma,  [ ]  Wheezing Musculoskeletal:  [ ]  Arthritis, [ ]  Low back pain, [ ]  Joint pain Hematologic: [ ]  Easy Bruising, [ ]  Anemia; [ ]  Hepatitis Gastrointestinal: [ ]  Blood in stool, [ ]  Gastroesophageal Reflux/heartburn, [ ]  Trouble swallowing Urinary: [ ]  chronic Kidney disease, [ ]  on HD - [ ]  MWF or [ ]  TTHS,  [ ]  Burning with urination, [ ]  Difficulty urinating Skin: [ ]  Rashes, [ ]  Wounds Psychological: [ ]  Anxiety, [ ]  Depression   Social History History  Substance Use Topics  . Smoking status: Never Smoker   . Smokeless tobacco: Never Used  . Alcohol Use: No    Family History Family History  Problem Relation Age of Onset  . Heart disease Father   . Stroke Other     Grandparent  . Heart attack Other     Grandparent  . Heart block      Brother  . Heart disease Paternal Grandmother   . Heart disease Paternal Grandfather     No Known Allergies  Current Outpatient Prescriptions  Medication Sig Dispense Refill  . aspirin 325 MG EC tablet Take 325 mg by mouth daily.      . clopidogrel (PLAVIX) 75 MG tablet Take 75 mg by mouth daily.        . metoprolol succinate (TOPROL-XL) 25 MG 24 hr tablet Take 25 mg by mouth daily.        . Multiple Vitamin (MULTIVITAMIN) tablet Take 1 tablet by mouth daily.        Marland Kitchen NITROSTAT 0.4 MG SL tablet DISSOLVE 1 TABLET UNDER  TONGUE EVERY 5 MINUTES UPTO 15 MIN FOR CHEST PAIN.IF NO RELIEF CALL 911.  25  each  6  . omeprazole (PRILOSEC) 20 MG capsule Take 20 mg by mouth daily.      . ramipril (ALTACE) 10 MG capsule Take 10 mg by mouth daily.        . simvastatin (ZOCOR) 40 MG tablet Take 40 mg by mouth at bedtime.          Physical Examination  Filed Vitals:   03/06/12 1131  BP: 121/81  Pulse: 57  Resp:     Body mass index is 30.89 kg/(m^2).  General:  WDWN in NAD Gait: Normal HEENT: WNL Eyes: Pupils equal Pulmonary: normal non-labored breathing , without Rales, rhonchi,  wheezing Cardiac: RRR, without  Murmurs, rubs or gallops; Abdomen: soft, NT, no masses Skin: no rashes, ulcers noted  Vascular Exam Pulses: 2+ radial pulses bilaterally Carotid bruits: Carotid pulses to auscultation no bruits are heard Extremities without ischemic changes, no Gangrene , no cellulitis; no open wounds;  Musculoskeletal: no muscle wasting or  atrophy   Neurologic: A&O X 3; Appropriate Affect ; SENSATION: normal; MOTOR FUNCTION:  moving all extremities equally. Speech is fluent/normal  Non-Invasive Vascular Imaging CAROTID DUPLEX 03/06/2012  Right ICA 0 - 19% stenosis Left ICA 40 - 59 % stenosis   ASSESSMENT/PLAN: Asymptomatic patient with no change in carotid duplex since December 2011. The patient will followup here in one year with repeat carotid duplex. The patient's questions were encouraged and answered, he is in agreement with this plan.  Kevin Reeves ANP   Clinic MD: Kevin Reeves

## 2012-03-06 NOTE — Progress Notes (Signed)
Carotid duplex performed @ [facility] 03/06/2012

## 2012-07-25 ENCOUNTER — Encounter: Payer: Self-pay | Admitting: Cardiology

## 2012-07-25 ENCOUNTER — Ambulatory Visit (INDEPENDENT_AMBULATORY_CARE_PROVIDER_SITE_OTHER): Payer: Medicare Other | Admitting: Cardiology

## 2012-07-25 VITALS — BP 108/62 | HR 59 | Ht 69.0 in | Wt 199.0 lb

## 2012-07-25 DIAGNOSIS — E785 Hyperlipidemia, unspecified: Secondary | ICD-10-CM

## 2012-07-25 DIAGNOSIS — I7090 Unspecified atherosclerosis: Secondary | ICD-10-CM

## 2012-07-25 DIAGNOSIS — R5381 Other malaise: Secondary | ICD-10-CM

## 2012-07-25 DIAGNOSIS — I2510 Atherosclerotic heart disease of native coronary artery without angina pectoris: Secondary | ICD-10-CM

## 2012-07-25 DIAGNOSIS — R5383 Other fatigue: Secondary | ICD-10-CM

## 2012-07-25 DIAGNOSIS — I10 Essential (primary) hypertension: Secondary | ICD-10-CM

## 2012-07-25 DIAGNOSIS — I679 Cerebrovascular disease, unspecified: Secondary | ICD-10-CM

## 2012-07-25 DIAGNOSIS — I6529 Occlusion and stenosis of unspecified carotid artery: Secondary | ICD-10-CM

## 2012-07-25 NOTE — Progress Notes (Signed)
Patient ID: Kevin Reeves, male   DOB: 04-Jul-1940, 72 y.o.   MRN: [id number]  HPI: Scheduled return visit for this nice gentleman with coronary artery disease and multiple cardiovascular risk factors.  He reports a recent trip to [place] during which he walked extensively without cardiopulmonary symptoms.  Cerebrovascular disease is monitored by [name] and has been stable.  He has developed no new medical problems nor has he required urgent medical care within the past 12 months.  Prior to Admission medications   Medication Sig Start Date End Date Taking? Authorizing Provider  aspirin 325 MG EC tablet Take 325 mg by mouth daily.   Yes Historical Provider, MD  clopidogrel (PLAVIX) 75 MG tablet Take 75 mg by mouth daily.     Yes Historical Provider, MD  metoprolol succinate (TOPROL-XL) 25 MG 24 hr tablet Take 25 mg by mouth daily.     Yes Historical Provider, MD  Multiple Vitamin (MULTIVITAMIN) tablet Take 1 tablet by mouth daily.     Yes Historical Provider, MD  NITROSTAT 0.4 MG SL tablet DISSOLVE 1 TABLET UNDER  TONGUE EVERY 5 MINUTES UPTO 15 MIN FOR CHEST PAIN.IF NO RELIEF CALL 911. 02/22/11  Yes Kevin Brunswick, MD  omeprazole (PRILOSEC) 20 MG capsule Take 20 mg by mouth daily.   Yes Historical Provider, MD  ramipril (ALTACE) 10 MG capsule Take 10 mg by mouth daily.     Yes Historical Provider, MD  simvastatin (ZOCOR) 40 MG tablet Take 40 mg by mouth at bedtime.     Yes Historical Provider, MD  No Known Allergies    Past medical history, social history, and family history reviewed and updated.  ROS: Denies chest pain, dyspnea, orthopnea, PND, palpitations, lightheadedness or syncope.  He does note occasional mild pedal edema.  All other systems reviewed and are negative.  PHYSICAL EXAM: BP 108/62  Pulse 59  Ht 5\' 9"  (1.753 m)  Wt 90.266 kg (199 lb)  BMI 29.39 kg/m2  SpO2 98%  General-Well developed; no acute distress Body habitus-proportionate weight and height Neck-No JVD; no  carotid bruits Lungs-clear lung fields; resonant to percussion Cardiovascular-normal PMI; normal S1 and S2 Abdomen-normal bowel sounds; soft and non-tender without masses or organomegaly Musculoskeletal-No deformities, no cyanosis or clubbing Neurologic-Normal cranial nerves; symmetric strength and tone Skin-Warm, no significant lesions Extremities-distal pulses intact; 1/2+ ankle edema  ASSESSMENT AND PLAN:  [place] Bing, MD 07/25/2012 3:54 PM

## 2012-07-25 NOTE — Assessment & Plan Note (Signed)
Stable mild obstructive disease of the left internal carotid artery with continued patency at the endarterectomy site on the right.  Dr. Hart Rochester he is monitoring and managing this problem.  We will continue to optimally control blood pressure and hyperlipidemia.

## 2012-07-25 NOTE — Progress Notes (Deleted)
Name: Kevin Reeves    DOB: January 23, 1941  Age: 72 y.o.  MR#: [id number]       PCP:  Cassell Smiles., MD      Insurance: @PAYORNAME @   CC:   No chief complaint on file.   VS BP 108/62  Pulse 59  Ht 5\' 9"  (1.753 m)  Wt 199 lb (90.266 kg)  BMI 29.39 kg/m2  SpO2 98%  Weights Current Weight  07/25/12 199 lb (90.266 kg)  03/06/12 197 lb 3.2 oz (89.449 kg)  07/21/11 192 lb (87.091 kg)    Blood Pressure  BP Readings from Last 3 Encounters:  07/25/12 108/62  03/06/12 121/81  07/21/11 107/73     Admit date:  (Not on file) Last encounter with RMR:  Visit date not found   Allergy No Known Allergies  Current Outpatient Prescriptions  Medication Sig Dispense Refill  . aspirin 325 MG EC tablet Take 325 mg by mouth daily.      . clopidogrel (PLAVIX) 75 MG tablet Take 75 mg by mouth daily.        . metoprolol succinate (TOPROL-XL) 25 MG 24 hr tablet Take 25 mg by mouth daily.        . Multiple Vitamin (MULTIVITAMIN) tablet Take 1 tablet by mouth daily.        Marland Kitchen NITROSTAT 0.4 MG SL tablet DISSOLVE 1 TABLET UNDER  TONGUE EVERY 5 MINUTES UPTO 15 MIN FOR CHEST PAIN.IF NO RELIEF CALL 911.  25 each  6  . omeprazole (PRILOSEC) 20 MG capsule Take 20 mg by mouth daily.      . ramipril (ALTACE) 10 MG capsule Take 10 mg by mouth daily.        . simvastatin (ZOCOR) 40 MG tablet Take 40 mg by mouth at bedtime.          Discontinued Meds:   There are no discontinued medications.  Patient Active Problem List  Diagnosis  . Cholelithiasis  . Arteriosclerotic cardiovascular disease (ASCVD)  . Cerebrovascular disease  . Hypertension  . Gastroesophageal reflux disease  . Hyperlipidemia  . Occlusion and stenosis of carotid artery without mention of cerebral infarction    LABS No visits with results within 3 Month(s) from this visit. Latest known visit with results is:  Office Visit on 07/21/2011  Component Date Value  . Sodium 07/27/2011 138   . Potassium 07/27/2011 5.0   . Chloride  07/27/2011 105   . CO2 07/27/2011 24   . Glucose, Bld 07/27/2011 96   . BUN 07/27/2011 19   . Creat 07/27/2011 1.27   . Total Bilirubin 07/27/2011 0.4   . Alkaline Phosphatase 07/27/2011 37*  . AST 07/27/2011 19   . ALT 07/27/2011 16   . Total Protein 07/27/2011 6.8   . Albumin 07/27/2011 4.5   . Calcium 07/27/2011 9.7   . Cholesterol 07/27/2011 157   . Triglycerides 07/27/2011 200*  . HDL 07/27/2011 36*  . Total CHOL/HDL Ratio 07/27/2011 4.4   . VLDL 07/27/2011 40   . LDL Cholesterol 07/27/2011 81      Results for this Opt Visit:     Results for orders placed in visit on 07/21/11  COMPREHENSIVE METABOLIC PANEL      Component Value Range   Sodium 138  135 - 145 mEq/L   Potassium 5.0  3.5 - 5.3 mEq/L   Chloride 105  96 - 112 mEq/L   CO2 24  19 - 32 mEq/L   Glucose, Bld 96  70 -  99 mg/dL   BUN 19  6 - 23 mg/dL   Creat 7.82  9.56 - 2.13 mg/dL   Total Bilirubin 0.4  0.3 - 1.2 mg/dL   Alkaline Phosphatase 37 (*) 39 - 117 U/L   AST 19  0 - 37 U/L   ALT 16  0 - 53 U/L   Total Protein 6.8  6.0 - 8.3 g/dL   Albumin 4.5  3.5 - 5.2 g/dL   Calcium 9.7  8.4 - 08.6 mg/dL  LIPID PANEL      Component Value Range   Cholesterol 157  0 - 200 mg/dL   Triglycerides 578 (*) <150 mg/dL   HDL 36 (*) >46 mg/dL   Total CHOL/HDL Ratio 4.4     VLDL 40  0 - 40 mg/dL   LDL Cholesterol 81  0 - 99 mg/dL    EKG Orders placed in visit on 07/21/09  . CONVERTED CEMR EKG     Prior Assessment and Plan Problem List as of 07/25/2012            Cardiology Problems   Arteriosclerotic cardiovascular disease (ASCVD)   Last Assessment & Plan Note   07/21/2011 Office Visit Signed 07/21/2011  1:34 PM by Kathlen Brunswick, MD    Patient continues to do well with respect to coronary disease, having had no manifestations of that condition since stent thrombosis in 2007.  He is to be maintained on dual antiplatelet therapy indefinitely.    Cerebrovascular disease   Last Assessment & Plan Note   07/21/2011  Office Visit Signed 07/21/2011  1:36 PM by Kathlen Brunswick, MD    Cerebrovascular disease has been stable since at least 2008 with mild to moderate stenosis on the left and a widely patent endarterectomy site on the right.  Dr. Hart Rochester continues to follow.    Hypertension   Last Assessment & Plan Note   07/21/2011 Office Visit Signed 07/21/2011  1:38 PM by Kathlen Brunswick, MD    Blood pressure control is extremely good; current medication will be continued.    Hyperlipidemia   Last Assessment & Plan Note   07/21/2011 Office Visit Addendum 07/22/2011  7:04 PM by Kathlen Brunswick, MD    Lipid profile was excellent one year ago.  Repeat study will be obtained.     Occlusion and stenosis of carotid artery without mention of cerebral infarction     Other   Cholelithiasis   Gastroesophageal reflux disease       Imaging: No results found.   FRS Calculation: Score not calculated. Missing: Total Cholesterol

## 2012-07-25 NOTE — Assessment & Plan Note (Signed)
No hypertensive blood pressure measurements within the past 2 years.  Current modest medical regimen appears adequate and will be continued.

## 2012-07-25 NOTE — Patient Instructions (Addendum)
Your physician recommends that you schedule a follow-up appointment in: 1 year  Your physician recommends that you return for lab work in: Today  Stools x 3 and return to office

## 2012-07-25 NOTE — Assessment & Plan Note (Signed)
Patient has had no symptoms suggesting myocardial ischemia over the past 6 years.  We will continue optimal management of cardiovascular risk factors.

## 2012-07-25 NOTE — Assessment & Plan Note (Signed)
Excellent management of lipids when last assessed.  A repeat profile will be obtained.

## 2012-08-15 ENCOUNTER — Encounter: Payer: Self-pay | Admitting: *Deleted

## 2012-08-22 LAB — LIPID PANEL
Cholesterol: 160 mg/dL (ref 0–200)
LDL Cholesterol: 82 mg/dL (ref 0–99)
VLDL: 42 mg/dL — ABNORMAL HIGH (ref 0–40)

## 2012-08-22 LAB — CBC
Hemoglobin: 14 g/dL (ref 13.0–17.0)
MCH: 29.9 pg (ref 26.0–34.0)
MCHC: 33.6 g/dL (ref 30.0–36.0)
MCV: 88.9 fL (ref 78.0–100.0)

## 2012-08-22 LAB — COMPREHENSIVE METABOLIC PANEL
AST: 17 U/L (ref 0–37)
Albumin: 4.3 g/dL (ref 3.5–5.2)
Alkaline Phosphatase: 37 U/L — ABNORMAL LOW (ref 39–117)
BUN: 21 mg/dL (ref 6–23)
Potassium: 4.1 mEq/L (ref 3.5–5.3)
Sodium: 139 mEq/L (ref 135–145)

## 2012-08-22 LAB — TSH: TSH: 4.634 u[IU]/mL — ABNORMAL HIGH (ref 0.350–4.500)

## 2012-08-23 ENCOUNTER — Encounter: Payer: Self-pay | Admitting: Cardiology

## 2012-08-24 ENCOUNTER — Ambulatory Visit (INDEPENDENT_AMBULATORY_CARE_PROVIDER_SITE_OTHER): Payer: Medicare Other | Admitting: *Deleted

## 2012-08-24 ENCOUNTER — Other Ambulatory Visit: Payer: Self-pay | Admitting: *Deleted

## 2012-08-24 DIAGNOSIS — Z7901 Long term (current) use of anticoagulants: Secondary | ICD-10-CM

## 2012-08-24 LAB — POC HEMOCCULT BLD/STL (HOME/3-CARD/SCREEN)
Card #3 Fecal Occult Blood, POC: NEGATIVE
Fecal Occult Blood, POC: NEGATIVE

## 2013-03-12 ENCOUNTER — Ambulatory Visit: Payer: Medicare Other | Admitting: Neurosurgery

## 2013-03-12 ENCOUNTER — Other Ambulatory Visit: Payer: Medicare Other

## 2013-03-19 ENCOUNTER — Other Ambulatory Visit (INDEPENDENT_AMBULATORY_CARE_PROVIDER_SITE_OTHER): Payer: Medicare Other | Admitting: *Deleted

## 2013-03-19 ENCOUNTER — Ambulatory Visit: Payer: Medicare Other | Admitting: Family

## 2013-03-19 DIAGNOSIS — I6529 Occlusion and stenosis of unspecified carotid artery: Secondary | ICD-10-CM

## 2013-03-19 DIAGNOSIS — Z48812 Encounter for surgical aftercare following surgery on the circulatory system: Secondary | ICD-10-CM

## 2013-03-20 ENCOUNTER — Other Ambulatory Visit: Payer: Self-pay | Admitting: *Deleted

## 2013-03-20 DIAGNOSIS — Z48812 Encounter for surgical aftercare following surgery on the circulatory system: Secondary | ICD-10-CM

## 2013-03-21 ENCOUNTER — Encounter: Payer: Self-pay | Admitting: Vascular Surgery

## 2013-05-31 ENCOUNTER — Encounter: Payer: Self-pay | Admitting: Cardiology

## 2013-07-15 ENCOUNTER — Ambulatory Visit (INDEPENDENT_AMBULATORY_CARE_PROVIDER_SITE_OTHER): Payer: Medicare HMO | Admitting: Cardiology

## 2013-07-15 ENCOUNTER — Encounter: Payer: Self-pay | Admitting: Cardiology

## 2013-07-15 VITALS — BP 123/72 | HR 75 | Ht 68.0 in | Wt 199.0 lb

## 2013-07-15 DIAGNOSIS — I2510 Atherosclerotic heart disease of native coronary artery without angina pectoris: Secondary | ICD-10-CM

## 2013-07-15 DIAGNOSIS — E785 Hyperlipidemia, unspecified: Secondary | ICD-10-CM

## 2013-07-15 DIAGNOSIS — I10 Essential (primary) hypertension: Secondary | ICD-10-CM

## 2013-07-15 MED ORDER — ATORVASTATIN CALCIUM 80 MG PO TABS: 80.0000 mg | ORAL_TABLET | Freq: Every day | ORAL | Status: DC

## 2013-07-15 NOTE — Progress Notes (Signed)
Clinical Summary [name] is a 73 y.o.male former patient of Dr Kevin Reeves, this is our first visit together. He is seen for the following medical problems  1. 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 LV gram at that time 40-45% with akinesis of lateral wall. Do not see more recent echo in system - denies any chest pain. No SOB or DOE. Walks up 20 steps several times a day without issues. - compliant with meds  2. HTN - does not check at home - compliant with meds  3. Carotid stenosis - prior right CEA - followed by vascular surgery  4. Hyperlipidemia - 04/2013: TC 163 TG 180 HDL 36 LDL 91 - compliant with statin Past Medical History  Diagnosis Date  . Arteriosclerotic cardiovascular disease (ASCVD)     Stent to RI in 2004. Posterior MI in 4/07 with thrombosis of RI at stent site while off Plavix --> reintervention; do not d/c plavix  . Cerebrovascular disease     Left carotid bruit with 40-59% ICA stenosis; right carotid endarectomy in 2008  . Hypertension   . Colonic polyp 1999    Adenoma resected in 1999; history of diverticulosis  . Gastroesophageal reflux disease     Dilation of esophagel stricture in 2008  . Hyperlipidemia   . Cholelithiasis 07/21/2009    Asymptomatic     No Known Allergies   Current Outpatient Prescriptions  Medication Sig Dispense Refill  . aspirin 325 MG EC tablet Take 325 mg by mouth daily.      . clopidogrel (PLAVIX) 75 MG tablet Take 75 mg by mouth daily.        . metoprolol succinate (TOPROL-XL) 25 MG 24 hr tablet Take 25 mg by mouth daily.        . Multiple Vitamin (MULTIVITAMIN) tablet Take 1 tablet by mouth daily.        Marland Kitchen NITROSTAT 0.4 MG SL tablet DISSOLVE 1 TABLET UNDER  TONGUE EVERY 5 MINUTES UPTO 15 MIN FOR CHEST PAIN.IF NO RELIEF CALL 911.  25 each  6  . omeprazole (PRILOSEC) 20 MG capsule Take 20 mg by mouth daily.      . ramipril  (ALTACE) 10 MG capsule Take 10 mg by mouth daily.        . simvastatin (ZOCOR) 40 MG tablet Take 40 mg by mouth at bedtime.         No current facility-administered medications for this visit.     Past Surgical History  Procedure Laterality Date  . Carotid endarterectomy  2008    Right  . Colonoscopy  2008    adenoma resected in 1999; no intervention in 2008     No Known Allergies    Family History  Problem Relation Age of Onset  . Heart disease Father   . Stroke Other     Grandparent  . Heart attack Other     Grandparent  . Heart block      Brother  . Heart disease Paternal Grandmother   . Heart disease Paternal Grandfather      Social History [name] reports that he has never smoked. He has never used smokeless tobacco. [name] reports that he does not drink alcohol.   Review of Systems CONSTITUTIONAL: No weight loss, fever, chills, weakness or fatigue.  HEENT: Eyes: No visual loss, blurred vision, double vision or yellow sclerae.No hearing loss, sneezing,  congestion, runny nose or sore throat.  SKIN: No rash or itching.  CARDIOVASCULAR: per HPI RESPIRATORY: No shortness of breath, cough or sputum.  GASTROINTESTINAL: No anorexia, nausea, vomiting or diarrhea. No abdominal pain or blood.  GENITOURINARY: No burning on urination, no polyuria NEUROLOGICAL: No headache, dizziness, syncope, paralysis, ataxia, numbness or tingling in the extremities. No change in bowel or bladder control.  MUSCULOSKELETAL: No muscle, back pain, joint pain or stiffness.  LYMPHATICS: No enlarged nodes. No history of splenectomy.  PSYCHIATRIC: No history of depression or anxiety.  ENDOCRINOLOGIC: No reports of sweating, cold or heat intolerance. No polyuria or polydipsia.  Marland Kitchen   Physical Examination p 75 bp 123/72 Wt 199 lbs BMI 30 Gen: resting comfortably, no acute distress HEENT: no scleral icterus, pupils equal round and reactive, no palptable cervical adenopathy,  CV: RRR,  no m/r/g, no JVD, no carotid bruits Resp: Clear to auscultation bilaterally GI: abdomen is soft, non-tender, non-distended, normal bowel sounds, no hepatosplenomegaly MSK: extremities are warm, no edema.  Skin: warm, no rash Neuro:  no focal deficits Psych: appropriate affect   Diagnostic Studies 09/2005 Cath RESULTS: The aortic pressure was 122/62 with mean of 89 and left  ventricular pressure was 122/20.  LEFT MAIN CORONARY ARTERY: The left main coronary artery was free of  disease.  LEFT ANTERIOR DESCENDING ARTERY: The left anterior descending artery gave  rise to a large diagonal Genevive Printup and two septal perforators. There was 70%  narrowing of the proximal LAD and 90% stenosis of the ostium of the  diagonal Conchetta Lamia.  CIRCUMFLEX ARTERY: The circumflex artery gave rise to a ramus Candus Braud and  two marginal branches and the posterolateral branches. The ramus Danzell Birky was  completed occluded near its ostium within the stent. There was __________  ostial stenosis of the first marginal Daurice Ovando.  RIGHT CORONARY ARTERY: The right coronary artery is a moderate size vessel  that gave rise to a right ventricular Alexandrea Westergard, posterior descending Camila Norville  and two posterolateral branches. There was 30% narrowing in the mid right  coronary artery.  LEFT VENTRICULOGRAM: The left ventriculogram performed in the RAO  projection showed akinesis of the lateral wall. The estimated ejection  fraction was 40-45%. The left ventriculogram performed in the LAO  projection showed akinesis of the posterior and inferolateral wall. The tip  of the apex and septum moved well and the superior portion of the posterior  wall moved well.  Following aspiration thrombectomy and PTCA of the in-stent thrombotic lesion  in the ramus Kahil Agner of the circumflex artery the stenosis improved from 100%  to 0% and the flow improved from TIMI 0 to TIMI 3 flow. Myocardial blush  improved from grade 0 to grade 2.  The patient had the onset  of chest pain at 12:35 and arrived at [facility]  emergency room at 12:42. The first ECG was obtained at 12:50 and this was  not diagnostic. The second ECG at 1325 was diagnostic for a posterior  infarction. The patient arrived at the cath lab at [facility] at 1510 and  the reperfusion was established with a diver catheter at 1552. This gave a  __________ time from the time of the diagnostic ECG of 2 hours and 27  minutes and a refusion time of 2 hours and 27 minutes.  CONCLUSION:  1. 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  2. Successful aspiration thrombectomy and PTCA for in-stent thrombosis of  the ramus Margart Zemanek of the circumflex artery with improvement in central  narrowing from 100% to 0%, improvement of flow from TIMI 0 to TIMI 3  flow.     Assessment and Plan  1. CAD/ICM - no current symptoms - continue risk factor modifcation and secondary prevention, he is committed to lifelong plavix - change ASA to 81mg  daily  2. HTN - at goal, continue current meds  3. Carotid stenosis - no current symptoms, continue regular follow up with vascular  4. Hyperlipidemia - not at goal based on most recent lipid panel, goal LDL<70 given history of CAD - will change to lipitor 80mg  daily    Follow up 1 year   , M.D., F.A.C.C.

## 2013-07-15 NOTE — Patient Instructions (Addendum)
Your physician wants you to follow-up in: ONE YEAR You will receive a reminder letter in the mail two months in advance. If you don't receive a letter, please call our office to schedule the follow-up appointment.  Your physician has recommended you make the following change in your medication:   1) DECREASE YOUR ASPIRIN TO 81MG  ONCE DAILY 2) COMPLETE YOUR CURRENT PRESCRIPTION FOR ZOCOR (SIMVASTATIN)  3) START LIPITOR 80MG  ONCE DAILY   Your physician recommends that you return for lab work in: 2 MONTHS (SLIPS GIVEN FOR CMET)

## 2013-08-20 ENCOUNTER — Other Ambulatory Visit: Payer: Self-pay | Admitting: Vascular Surgery

## 2013-08-20 DIAGNOSIS — I6529 Occlusion and stenosis of unspecified carotid artery: Secondary | ICD-10-CM

## 2013-08-20 DIAGNOSIS — Z48812 Encounter for surgical aftercare following surgery on the circulatory system: Secondary | ICD-10-CM

## 2013-10-23 LAB — COMPREHENSIVE METABOLIC PANEL
ALBUMIN: 3.8 g/dL (ref 3.5–5.2)
ALK PHOS: 218 U/L — AB (ref 39–117)
ALT: 190 U/L — ABNORMAL HIGH (ref 0–53)
AST: 109 U/L — AB (ref 0–37)
BUN: 18 mg/dL (ref 6–23)
CO2: 28 mEq/L (ref 19–32)
CREATININE: 1.1 mg/dL (ref 0.50–1.35)
Calcium: 9.3 mg/dL (ref 8.4–10.5)
Chloride: 104 mEq/L (ref 96–112)
Glucose, Bld: 114 mg/dL — ABNORMAL HIGH (ref 70–99)
POTASSIUM: 4.5 meq/L (ref 3.5–5.3)
Sodium: 138 mEq/L (ref 135–145)
Total Bilirubin: 0.6 mg/dL (ref 0.2–1.2)
Total Protein: 6.7 g/dL (ref 6.0–8.3)

## 2013-10-31 ENCOUNTER — Telehealth: Payer: Self-pay

## 2013-10-31 NOTE — Telephone Encounter (Signed)
Clarified with pt he is only taking Atorvastatin 80 mg daily. He will discontinue until after repeat CMET in 2 weeks (5/20).Patient however is going on an [place] cruise in 2 weeks and we will unreachable by phone from 5/22 thru 6/4  Patient does not drink alchol or use tylenol

## 2013-10-31 NOTE — Telephone Encounter (Signed)
Message copied by Nori Riis on Thu Oct 31, 2013  4:06 PM ------      Message from: [place] F      Created: Thu Oct 31, 2013  8:49 AM       Please varify the statin and dose he is taking, I believe we increased him to lipitor 80 mg daily. Varify he did not continue both simvastatin and start atorvastatin. His labs show some mild inflammation of the liver, he needs to hold his statin and have repeat CMET in 2 weeks.             Dominga Ferry MD       ------

## 2013-11-15 ENCOUNTER — Other Ambulatory Visit: Payer: Self-pay

## 2013-11-15 ENCOUNTER — Telehealth: Payer: Self-pay

## 2013-11-15 DIAGNOSIS — E785 Hyperlipidemia, unspecified: Secondary | ICD-10-CM

## 2013-11-15 NOTE — Telephone Encounter (Signed)
Faythe Ghee asked pt to stay so she could ask nurse to place lab slip.She states patient was impatient and stated he would get labs done upon return from his [place] vacation .[facility] LPN states she was never asked to assist this patient.Order placed,slip mailed to pts home

## 2013-11-15 NOTE — Telephone Encounter (Signed)
Message copied by Nori Riis on Fri Nov 15, 2013 11:28 AM ------      Message from: San Jetty      Created: Thu Nov 14, 2013  9:29 AM      Regarding: lab work       Pt stopped in today. No lab orders are in computer. Pt was told to go have labs done. Please put orders in and call patient. ------

## 2013-12-03 LAB — COMPREHENSIVE METABOLIC PANEL
ALT: 18 U/L (ref 0–53)
AST: 15 U/L (ref 0–37)
Albumin: 3.9 g/dL (ref 3.5–5.2)
Alkaline Phosphatase: 64 U/L (ref 39–117)
BUN: 20 mg/dL (ref 6–23)
CALCIUM: 9.1 mg/dL (ref 8.4–10.5)
CHLORIDE: 106 meq/L (ref 96–112)
CO2: 24 meq/L (ref 19–32)
CREATININE: 1.14 mg/dL (ref 0.50–1.35)
GLUCOSE: 86 mg/dL (ref 70–99)
Potassium: 4.6 mEq/L (ref 3.5–5.3)
Sodium: 139 mEq/L (ref 135–145)
Total Bilirubin: 0.4 mg/dL (ref 0.2–1.2)
Total Protein: 6.9 g/dL (ref 6.0–8.3)

## 2013-12-04 ENCOUNTER — Encounter: Payer: Self-pay | Admitting: *Deleted

## 2014-02-05 ENCOUNTER — Telehealth: Payer: Self-pay | Admitting: *Deleted

## 2014-02-05 DIAGNOSIS — E785 Hyperlipidemia, unspecified: Secondary | ICD-10-CM

## 2014-02-05 MED ORDER — PRAVASTATIN SODIUM 20 MG PO TABS: 20.0000 mg | ORAL_TABLET | Freq: Every evening | ORAL | Status: DC

## 2014-02-05 NOTE — Telephone Encounter (Signed)
Message copied by Nori Riis on Wed Feb 05, 2014 12:38 PM ------      Message from: [place] F      Created: Wed Feb 05, 2014 11:58 AM       We stopped his lipitor previouly because high dose appeared to have caused some mild liver inflammation that resolved off medicine. We can try a more milder cholesterol medicine that is better tolerated by the liver, would start pravastatin 20mg  daily with CMET in 1 month                  MD      ----- Message -----         From: Dominga Ferry, CMA         Sent: 02/05/2014  10:32 AM           To: 04/07/2014, MD            Pt wants to know if safe to start back on cholesterol med concerned about liver. Please advise -- thanks        ------

## 2014-02-05 NOTE — Telephone Encounter (Signed)
Pt would like to know if he needs to start back on cholesterol pills. He was taken off it due to inflammation in liver, but has not started back on it and doesn't know if he it to or not.

## 2014-02-05 NOTE — Telephone Encounter (Signed)
Spoke with wife,she requested 30 day supply first to see how pt tolerates medication.I have mailed CMET for 1 month

## 2014-02-14 ENCOUNTER — Other Ambulatory Visit (HOSPITAL_COMMUNITY): Payer: Self-pay | Admitting: Internal Medicine

## 2014-02-14 DIAGNOSIS — R7989 Other specified abnormal findings of blood chemistry: Secondary | ICD-10-CM

## 2014-02-14 DIAGNOSIS — R740 Nonspecific elevation of levels of transaminase and lactic acid dehydrogenase [LDH]: Secondary | ICD-10-CM

## 2014-02-14 DIAGNOSIS — R7401 Elevation of levels of liver transaminase levels: Secondary | ICD-10-CM

## 2014-02-19 ENCOUNTER — Ambulatory Visit (HOSPITAL_COMMUNITY)
Admission: RE | Admit: 2014-02-19 | Discharge: 2014-02-19 | Disposition: A | Discharge status: Home / Self Care | Payer: Medicare HMO | Source: Ambulatory Visit | Attending: Internal Medicine | Admitting: Internal Medicine

## 2014-02-19 DIAGNOSIS — R7401 Elevation of levels of liver transaminase levels: Secondary | ICD-10-CM

## 2014-02-19 DIAGNOSIS — R7989 Other specified abnormal findings of blood chemistry: Secondary | ICD-10-CM | POA: Insufficient documentation

## 2014-02-19 DIAGNOSIS — R740 Nonspecific elevation of levels of transaminase and lactic acid dehydrogenase [LDH]: Secondary | ICD-10-CM

## 2014-02-19 DIAGNOSIS — K8020 Calculus of gallbladder without cholecystitis without obstruction: Secondary | ICD-10-CM | POA: Diagnosis not present

## 2014-02-19 DIAGNOSIS — K7689 Other specified diseases of liver: Secondary | ICD-10-CM | POA: Diagnosis not present

## 2014-03-06 NOTE — H&P (Signed)
  NTS SOAP Note  Vital Signs:  Vitals as of: 03/06/2014: Systolic 125: Diastolic 78: Heart Rate 60: Temp 96.46F: Height 24ft 8in: Weight 189Lbs 0 Ounces: BMI 28.74  BMI : 28.74 kg/m2  Subjective: This 73 year old male presents forSymptoms of PROBLEM biliary colic.  Has had some right upper quadrant abdominal discomfort in past.  Has had elevated liver tests,  u/s showed cholelithiasis.  Had urine turn orange,  but that has since past.  No current abdominal pain,  fever,  chills,  jaundice.*  Review of Symptoms:  Constitutional:Unremarkable  negativeROSNEGGENROSGener...fatigue Head:unremarkablenegative   Head normal Eyes:unremarkable  negativeROSNEGEYESROSEyes... Nose/Mouth/Throat:unremarkablenegativeROSNEGNMTROSENMT... Cardiovascular:  unremarkablenegativeROSNEGCARDIO  ROSCVS... Respiratory:unremarkablenegativeROSNEGRESP  ROSResp... Gastrointestinal:  Unremarkable  negativeROSNEGGIROSGI...nausea Genitourinary:unremarkable  negativeROSNEGURINE  ROSGU... Musculoskeletal:UnremarkablenegativeROSNEGMSK  ROSMusc...joint pain Skin:UnremarkablenegativeROSNEGSKINROSSkinBr... Breast:Unremarkable  negativeROSNEGBREAST Hematolgic/Lymphatic:unremarkable  negativeROSNEGHEMELYMPH  ROSHemeLy... Allergic/Immunologic:unremarkablenegative  ROSNEGALLERGICROSAllImm...   Past Medical History:  Obtained  Reviewed  Past Medical History  Surgical History: right carotid endartectomy,  coronary stent  Medical Problems: CAD, HTN,  high cholesterol Allergies: nkda Medications: plavix,  ramipril,  metopeolol,  omeprazole,  baby asa,  pravastatin   Social History:ObtainedReviewed  Social History  Preferred Language: English Race:  White Ethnicity: Not Hispanic / Latino Age: 67 year Marital Status:  M Alcohol: no   Smoking Status: Never smoker reviewed on 03/06/2014 Functional Status reviewed on  03/06/2014 ------------------------------------------------ Bathing: Normal Cooking: Normal Dressing: Normal Driving: Normal Eating: Normal Managing Meds: Normal Oral Care: Normal Shopping: Normal Toileting: Normal Transferring: Normal Walking: Normal Cognitive Status reviewed on 03/06/2014 ------------------------------------------------ Attention: Normal Decision Making: Normal Language: Normal Memory: Normal Motor: Normal Perception: Normal Problem Solving: Normal Visual and Spatial: Normal   Family History:Obtained  Reviewed  Family Health History Mother, Deceased; Healthy;  Father, Deceased; Heart disease;     Objective Information: General:Unremarkable  Well appearing, well nourished in no distress.illGeneral Complex Abnormalities Skin:  Unremarkable   Skin NormalSkin Complex Abnormalities Head:UnremarkableHead normalHead Complex Abnormalities Eyes:Unremarkable  Eyes NormalEyes Complex Abnormano scleral icterus Mouth:Unremarkable  Mouth NormalMouth Complex Abnormalities Throat:Unremarkable  Throat NormalThroat Complex Abnormalities Neck:Unremarkable  Neck NormalNeck Complex Abnormano bruits Heart:Unremarkable  HeartBriefRRR, no murmur or gallop.  Normal S1, S2.  No S3, S4. Heart Complex Abnorm Lungs:  Unremarkable  CTA bilaterally, no wheezes, rhonchi, rales.  Breathing unlabored.Lungs Complex Abnorm Breasts:  Unremarkable  BreastsTannerGirlsBreastBreastsCA Abdomen:UnremarkableAbdomenBriefSoft, NT/ND, normal bowel sounds, no HSM, no masses.  No peritoneal signs.Abdomen Complex Abno Back:Unremarkable  Back NormalBack Complex Abnorma GU:Unremarkable  GUMaleTannerBoysGUGUMale Complex AbnorGUFemTannerGirlsGUGUFemCA Rectal:  UnremarkableRectalMaleRectalMale Complex ARectalFemRectalFemCA Extremities:Unremarkable  Extremities NormalExtremit Complex Abn Musculoskeletal:Unremarkable   MusculoskeMusculoCA Lymphatics:UnremarkableLymphBriefLymphaticsLymph Complex Abnormalities  Assessment:Cholelithiasis  Diagnoses: 574.20  K80.20 Gallstone (Calculus of gallbladder without cholecystitis without obstruction)  Procedures: 98264 - OFFICE OUTPATIENT NEW 30 MINUTES    Plan:  Scheduled for laparoscopic cholecystectomy on 03/28/14.  To hold plavix one week prior to surgery.   Patient Education:Alternative treatments to surgery were discussed with patient (and family).  Risks and benefits  of procedure including bleeding,  infection,  hepatobiliary injury,  and the possibility of an open procedure were fully explained to the patient (and family) who gave informed consent. Patient/family questions were addressed.  Follow-up:F/U...1 month 3 months 6 monthsPRNNTS F/U.Marland KitchenMarland KitchenPending Surgery

## 2014-03-20 ENCOUNTER — Other Ambulatory Visit (HOSPITAL_COMMUNITY): Payer: Medicare Other

## 2014-03-20 ENCOUNTER — Ambulatory Visit: Payer: Medicare Other | Admitting: Family

## 2014-03-24 ENCOUNTER — Telehealth: Payer: Self-pay | Admitting: *Deleted

## 2014-03-24 ENCOUNTER — Encounter (HOSPITAL_COMMUNITY)
Admission: RE | Admit: 2014-03-24 | Discharge: 2014-03-24 | Disposition: A | Discharge status: Home / Self Care | Payer: Medicare HMO | Source: Ambulatory Visit | Attending: General Surgery | Admitting: General Surgery

## 2014-03-24 ENCOUNTER — Encounter (HOSPITAL_COMMUNITY): Payer: Self-pay | Admitting: Pharmacy Technician

## 2014-03-24 ENCOUNTER — Encounter (HOSPITAL_COMMUNITY): Payer: Self-pay

## 2014-03-24 DIAGNOSIS — Z01810 Encounter for preprocedural cardiovascular examination: Secondary | ICD-10-CM | POA: Diagnosis present

## 2014-03-24 DIAGNOSIS — K8010 Calculus of gallbladder with chronic cholecystitis without obstruction: Secondary | ICD-10-CM | POA: Insufficient documentation

## 2014-03-24 DIAGNOSIS — Z01812 Encounter for preprocedural laboratory examination: Secondary | ICD-10-CM | POA: Insufficient documentation

## 2014-03-24 HISTORY — DX: Unspecified osteoarthritis, unspecified site: M19.90

## 2014-03-24 HISTORY — DX: Acute myocardial infarction, unspecified: I21.9

## 2014-03-24 LAB — BASIC METABOLIC PANEL
ANION GAP: 12 (ref 5–15)
BUN: 18 mg/dL (ref 6–23)
CALCIUM: 9.3 mg/dL (ref 8.4–10.5)
CO2: 23 mEq/L (ref 19–32)
Chloride: 104 mEq/L (ref 96–112)
Creatinine, Ser: 1.26 mg/dL (ref 0.50–1.35)
GFR calc Af Amer: 64 mL/min — ABNORMAL LOW (ref >90)
GFR, EST NON AFRICAN AMERICAN: 55 mL/min — AB (ref >90)
GLUCOSE: 99 mg/dL (ref 70–99)
POTASSIUM: 4.4 meq/L (ref 3.7–5.3)
SODIUM: 139 meq/L (ref 137–147)

## 2014-03-24 LAB — HEPATIC FUNCTION PANEL
ALK PHOS: 65 U/L (ref 39–117)
ALT: 20 U/L (ref 0–53)
AST: 18 U/L (ref 0–37)
Albumin: 3.7 g/dL (ref 3.5–5.2)
BILIRUBIN TOTAL: 0.4 mg/dL (ref 0.3–1.2)
Bilirubin, Direct: <0.2 mg/dL (ref 0.0–0.3)
TOTAL PROTEIN: 7.4 g/dL (ref 6.0–8.3)

## 2014-03-24 LAB — CBC WITH DIFFERENTIAL/PLATELET
BASOS ABS: 0 10*3/uL (ref 0.0–0.1)
Basophils Relative: 1 % (ref 0–1)
Eosinophils Absolute: 0.3 10*3/uL (ref 0.0–0.7)
Eosinophils Relative: 5 % (ref 0–5)
HCT: 41.6 % (ref 39.0–52.0)
Hemoglobin: 13.8 g/dL (ref 13.0–17.0)
LYMPHS ABS: 3.2 10*3/uL (ref 0.7–4.0)
LYMPHS PCT: 42 % (ref 12–46)
MCH: 30.2 pg (ref 26.0–34.0)
MCHC: 33.2 g/dL (ref 30.0–36.0)
MCV: 91 fL (ref 78.0–100.0)
Monocytes Absolute: 0.6 10*3/uL (ref 0.1–1.0)
Monocytes Relative: 8 % (ref 3–12)
NEUTROS ABS: 3.3 10*3/uL (ref 1.7–7.7)
NEUTROS PCT: 44 % (ref 43–77)
PLATELETS: 244 10*3/uL (ref 150–400)
RBC: 4.57 MIL/uL (ref 4.22–5.81)
RDW: 13 % (ref 11.5–15.5)
WBC: 7.5 10*3/uL (ref 4.0–10.5)

## 2014-03-24 MED ORDER — PRAVASTATIN SODIUM 20 MG PO TABS: 20.0000 mg | ORAL_TABLET | Freq: Every evening | ORAL | Status: DC

## 2014-03-24 NOTE — Pre-Procedure Instructions (Signed)
Patient given information to sign up for my chart at home. 

## 2014-03-24 NOTE — Telephone Encounter (Signed)
[employer] fax for pravastatin 20 mg #30 wanting #90 day supply paper in bin/tmj

## 2014-03-24 NOTE — Patient Instructions (Signed)
JARONE OSTERGAARD  03/24/2014   Your procedure is scheduled on:   03/28/2014  Report to [facility] at  615  AM.  Call this number if you have problems the morning of surgery: [phone]   Remember:   Do not eat food or drink liquids after midnight.   Take these medicines the morning of surgery with A SIP OF WATER: metoprolol, prilosec, ramipril   Do not wear jewelry, make-up or nail polish.  Do not wear lotions, powders, or perfumes.   Do not shave 48 hours prior to surgery. Men may shave face and neck.  Do not bring valuables to the hospital.  [facility] is not responsible for any belongings or valuables.               Contacts, dentures or bridgework may not be worn into surgery.  Leave suitcase in the car. After surgery it may be brought to your room.  For patients admitted to the hospital, discharge time is determined by your treatment team.               Patients discharged the day of surgery will not be allowed to drive home.  Name and phone number of your driver: family  Special Instructions: Shower using CHG 2 nights before surgery and the night before surgery.  If you shower the day of surgery use CHG.  Use special wash - you have one bottle of CHG for all showers.  You should use approximately 1/3 of the bottle for each shower.   Please read over the following fact sheets that you were given: Pain Booklet, Coughing and Deep Breathing, Surgical Site Infection Prevention, Anesthesia Post-op Instructions and Care and Recovery After Surgery Laparoscopic Cholecystectomy Laparoscopic cholecystectomy is surgery to remove the gallbladder. The gallbladder is located in the upper right part of the abdomen, behind the liver. It is a storage sac for bile produced in the liver. Bile aids in the digestion and absorption of fats. Cholecystectomy is often done for inflammation of the gallbladder (cholecystitis). This condition is usually caused by a buildup of gallstones (cholelithiasis)  in your gallbladder. Gallstones can block the flow of bile, resulting in inflammation and pain. In severe cases, emergency surgery may be required. When emergency surgery is not required, you will have time to prepare for the procedure. Laparoscopic surgery is an alternative to open surgery. Laparoscopic surgery has a shorter recovery time. Your common bile duct may also need to be examined during the procedure. If stones are found in the common bile duct, they may be removed. LET [facility] CARE PROVIDER KNOW ABOUT:  Any allergies you have.  All medicines you are taking, including vitamins, herbs, eye drops, creams, and over-the-counter medicines.  Previous problems you or members of your family have had with the use of anesthetics.  Any blood disorders you have.  Previous surgeries you have had.  Medical conditions you have. RISKS AND COMPLICATIONS Generally, this is a safe procedure. However, as with any procedure, complications can occur. Possible complications include:  Infection.  Damage to the common bile duct, nerves, arteries, veins, or other internal organs such as the stomach, liver, or intestines.  Bleeding.  A stone may remain in the common bile duct.  A bile leak from the cyst duct that is clipped when your gallbladder is removed.  The need to convert to open surgery, which requires a larger incision in the abdomen. This may be necessary if your surgeon thinks  it is not safe to continue with a laparoscopic procedure. BEFORE THE PROCEDURE  Ask your health care provider about changing or stopping any regular medicines. You will need to stop taking aspirin or blood thinners at least 5 days prior to surgery.  Do not eat or drink anything after midnight the night before surgery.  Let your health care provider know if you develop a cold or other infectious problem before surgery. PROCEDURE   You will be given medicine to make you sleep through the procedure (general  anesthetic). A breathing tube will be placed in your mouth.  When you are asleep, your surgeon will make several small cuts (incisions) in your abdomen.  A thin, lighted tube with a tiny camera on the end (laparoscope) is inserted through one of the small incisions. The camera on the laparoscope sends a picture to a TV screen in the operating room. This gives the surgeon a good view inside your abdomen.  A gas will be pumped into your abdomen. This expands your abdomen so that the surgeon has more room to perform the surgery.  Other tools needed for the procedure are inserted through the other incisions. The gallbladder is removed through one of the incisions.  After the removal of your gallbladder, the incisions will be closed with stitches, staples, or skin glue. AFTER THE PROCEDURE  You will be taken to a recovery area where your progress will be checked often.  You may be allowed to go home the same day if your pain is controlled and you can tolerate liquids. Document Released: 06/13/2005 Document Revised: 04/03/2013 Document Reviewed: 01/23/2013 [facility] Patient Information 2015 [place], [place]. This information is not intended to replace advice given to you by your health care provider. Make sure you discuss any questions you have with your health care provider. PATIENT INSTRUCTIONS POST-ANESTHESIA  IMMEDIATELY FOLLOWING SURGERY:  Do not drive or operate machinery for the first twenty four hours after surgery.  Do not make any important decisions for twenty four hours after surgery or while taking narcotic pain medications or sedatives.  If you develop intractable nausea and vomiting or a severe headache please notify your doctor immediately.  FOLLOW-UP:  Please make an appointment with your surgeon as instructed. You do not need to follow up with anesthesia unless specifically instructed to do so.  WOUND CARE INSTRUCTIONS (if applicable):  Keep a dry clean dressing on the  anesthesia/puncture wound site if there is drainage.  Once the wound has quit draining you may leave it open to air.  Generally you should leave the bandage intact for twenty four hours unless there is drainage.  If the epidural site drains for more than 36-48 hours please call the anesthesia department.  QUESTIONS?:  Please feel free to call your physician or the hospital operator if you have any questions, and they will be happy to assist you.

## 2014-03-28 ENCOUNTER — Encounter (HOSPITAL_COMMUNITY): Payer: Self-pay | Admitting: *Deleted

## 2014-03-28 ENCOUNTER — Encounter (HOSPITAL_COMMUNITY): Payer: Medicare HMO | Admitting: Anesthesiology

## 2014-03-28 ENCOUNTER — Ambulatory Visit (HOSPITAL_COMMUNITY)
Admission: RE | Admit: 2014-03-28 | Discharge: 2014-03-28 | Disposition: A | Discharge status: Home / Self Care | Payer: Medicare HMO | Source: Ambulatory Visit | Attending: General Surgery | Admitting: General Surgery

## 2014-03-28 ENCOUNTER — Encounter (HOSPITAL_COMMUNITY)
Admission: RE | Disposition: A | Discharge status: Home / Self Care | Payer: Self-pay | Source: Ambulatory Visit | Attending: General Surgery

## 2014-03-28 ENCOUNTER — Ambulatory Visit (HOSPITAL_COMMUNITY): Payer: Medicare HMO | Admitting: Anesthesiology

## 2014-03-28 DIAGNOSIS — I10 Essential (primary) hypertension: Secondary | ICD-10-CM | POA: Diagnosis not present

## 2014-03-28 DIAGNOSIS — Z79899 Other long term (current) drug therapy: Secondary | ICD-10-CM | POA: Insufficient documentation

## 2014-03-28 DIAGNOSIS — I2510 Atherosclerotic heart disease of native coronary artery without angina pectoris: Secondary | ICD-10-CM | POA: Insufficient documentation

## 2014-03-28 DIAGNOSIS — Z7982 Long term (current) use of aspirin: Secondary | ICD-10-CM | POA: Diagnosis not present

## 2014-03-28 DIAGNOSIS — K8010 Calculus of gallbladder with chronic cholecystitis without obstruction: Secondary | ICD-10-CM | POA: Diagnosis present

## 2014-03-28 DIAGNOSIS — E780 Pure hypercholesterolemia: Secondary | ICD-10-CM | POA: Insufficient documentation

## 2014-03-28 HISTORY — PX: CHOLECYSTECTOMY: SHX55

## 2014-03-28 SURGERY — LAPAROSCOPIC CHOLECYSTECTOMY
Anesthesia: General | Site: Abdomen

## 2014-03-28 MED ORDER — BUPIVACAINE HCL (PF) 0.5 % IJ SOLN
INTRAMUSCULAR | Status: DC | PRN
  Administered 2014-03-28: 10 mL

## 2014-03-28 MED ORDER — GLYCOPYRROLATE 0.2 MG/ML IJ SOLN
INTRAMUSCULAR | Status: DC | PRN
  Administered 2014-03-28: 0.6 mg via INTRAVENOUS

## 2014-03-28 MED ORDER — PROPOFOL 10 MG/ML IV BOLUS
INTRAVENOUS | Status: DC | PRN
  Administered 2014-03-28: 50 mg via INTRAVENOUS
  Administered 2014-03-28: 150 mg via INTRAVENOUS

## 2014-03-28 MED ORDER — POVIDONE-IODINE 10 % OINT PACKET
TOPICAL_OINTMENT | CUTANEOUS | Status: DC | PRN
  Administered 2014-03-28: 2 via TOPICAL

## 2014-03-28 MED ORDER — DEXTROSE 5 % IV SOLN
2.0000 g | INTRAVENOUS | Status: AC
  Administered 2014-03-28: 2 g via INTRAVENOUS
  Filled 2014-03-28: qty 2

## 2014-03-28 MED ORDER — PROPOFOL 10 MG/ML IV EMUL
INTRAVENOUS | Status: AC
  Filled 2014-03-28: qty 20

## 2014-03-28 MED ORDER — FENTANYL CITRATE 0.05 MG/ML IJ SOLN
INTRAMUSCULAR | Status: DC | PRN
  Administered 2014-03-28: 100 ug via INTRAVENOUS

## 2014-03-28 MED ORDER — CHLORHEXIDINE GLUCONATE 4 % EX LIQD: 1.0000 "application " | Freq: Once | CUTANEOUS | Status: DC

## 2014-03-28 MED ORDER — EPHEDRINE SULFATE 50 MG/ML IJ SOLN
INTRAMUSCULAR | Status: AC
  Filled 2014-03-28: qty 1

## 2014-03-28 MED ORDER — SUCCINYLCHOLINE CHLORIDE 20 MG/ML IJ SOLN
INTRAMUSCULAR | Status: DC | PRN
  Administered 2014-03-28: 120 mg via INTRAVENOUS

## 2014-03-28 MED ORDER — ONDANSETRON HCL 4 MG/2ML IJ SOLN
INTRAMUSCULAR | Status: DC | PRN
  Administered 2014-03-28: 4 mg via INTRAVENOUS

## 2014-03-28 MED ORDER — ROCURONIUM BROMIDE 100 MG/10ML IV SOLN
INTRAVENOUS | Status: DC | PRN
  Administered 2014-03-28: 10 mg via INTRAVENOUS
  Administered 2014-03-28: 20 mg via INTRAVENOUS

## 2014-03-28 MED ORDER — FENTANYL CITRATE 0.05 MG/ML IJ SOLN: 25.0000 ug | INTRAMUSCULAR | Status: DC | PRN

## 2014-03-28 MED ORDER — LACTATED RINGERS IV SOLN
INTRAVENOUS | Status: DC | PRN
  Administered 2014-03-28: 08:00:00 via INTRAVENOUS

## 2014-03-28 MED ORDER — HYDROCODONE-ACETAMINOPHEN 5-325 MG PO TABS: 1.0000 | ORAL_TABLET | ORAL | Status: DC | PRN

## 2014-03-28 MED ORDER — MIDAZOLAM HCL 2 MG/2ML IJ SOLN
INTRAMUSCULAR | Status: AC
  Filled 2014-03-28: qty 2

## 2014-03-28 MED ORDER — NEOSTIGMINE METHYLSULFATE 10 MG/10ML IV SOLN
INTRAVENOUS | Status: AC
  Filled 2014-03-28: qty 1

## 2014-03-28 MED ORDER — SUCCINYLCHOLINE CHLORIDE 20 MG/ML IJ SOLN
INTRAMUSCULAR | Status: AC
  Filled 2014-03-28: qty 1

## 2014-03-28 MED ORDER — DEXAMETHASONE SODIUM PHOSPHATE 4 MG/ML IJ SOLN
INTRAMUSCULAR | Status: AC
  Filled 2014-03-28: qty 1

## 2014-03-28 MED ORDER — POVIDONE-IODINE 10 % EX OINT
TOPICAL_OINTMENT | CUTANEOUS | Status: AC
  Filled 2014-03-28: qty 2

## 2014-03-28 MED ORDER — ONDANSETRON HCL 4 MG/2ML IJ SOLN
INTRAMUSCULAR | Status: AC
  Filled 2014-03-28: qty 2

## 2014-03-28 MED ORDER — MIDAZOLAM HCL 2 MG/2ML IJ SOLN
1.0000 mg | INTRAMUSCULAR | Status: DC | PRN
Start: 2014-03-28 — End: 2014-03-28
  Administered 2014-03-28: 2 mg via INTRAVENOUS

## 2014-03-28 MED ORDER — SODIUM CHLORIDE 0.9 % IR SOLN
Status: DC | PRN
  Administered 2014-03-28: 1000 mL

## 2014-03-28 MED ORDER — LIDOCAINE HCL (CARDIAC) 10 MG/ML IV SOLN
INTRAVENOUS | Status: DC | PRN
  Administered 2014-03-28: 60 mg via INTRAVENOUS

## 2014-03-28 MED ORDER — METOPROLOL TARTRATE 1 MG/ML IV SOLN
INTRAVENOUS | Status: AC
  Filled 2014-03-28: qty 5

## 2014-03-28 MED ORDER — LACTATED RINGERS IV SOLN
INTRAVENOUS | Status: DC
  Administered 2014-03-28: 1000 mL via INTRAVENOUS
  Administered 2014-03-28: 500 mL via INTRAVENOUS

## 2014-03-28 MED ORDER — HEMOSTATIC AGENTS (NO CHARGE) OPTIME
TOPICAL | Status: DC | PRN
  Administered 2014-03-28: 1 via TOPICAL

## 2014-03-28 MED ORDER — BUPIVACAINE HCL (PF) 0.5 % IJ SOLN
INTRAMUSCULAR | Status: AC
  Filled 2014-03-28: qty 30

## 2014-03-28 MED ORDER — METOPROLOL TARTRATE 1 MG/ML IV SOLN
5.0000 mg | Freq: Once | INTRAVENOUS | Status: AC
  Administered 2014-03-28: 5 mg via INTRAVENOUS

## 2014-03-28 MED ORDER — ONDANSETRON HCL 4 MG/2ML IJ SOLN: 4.0000 mg | Freq: Once | INTRAMUSCULAR | Status: DC | PRN

## 2014-03-28 MED ORDER — EPHEDRINE SULFATE 50 MG/ML IJ SOLN
INTRAMUSCULAR | Status: DC | PRN
  Administered 2014-03-28 (×2): 10 mg via INTRAVENOUS

## 2014-03-28 MED ORDER — LIDOCAINE HCL (PF) 1 % IJ SOLN
INTRAMUSCULAR | Status: AC
  Filled 2014-03-28: qty 5

## 2014-03-28 MED ORDER — KETOROLAC TROMETHAMINE 30 MG/ML IJ SOLN
30.0000 mg | Freq: Once | INTRAMUSCULAR | Status: AC
  Administered 2014-03-28: 30 mg via INTRAVENOUS
  Filled 2014-03-28: qty 1

## 2014-03-28 MED ORDER — ONDANSETRON HCL 4 MG/2ML IJ SOLN
4.0000 mg | Freq: Once | INTRAMUSCULAR | Status: AC
  Administered 2014-03-28: 4 mg via INTRAVENOUS

## 2014-03-28 MED ORDER — GLYCOPYRROLATE 0.2 MG/ML IJ SOLN
INTRAMUSCULAR | Status: AC
  Filled 2014-03-28: qty 3

## 2014-03-28 MED ORDER — DEXAMETHASONE SODIUM PHOSPHATE 10 MG/ML IJ SOLN
INTRAMUSCULAR | Status: DC | PRN
Start: 2014-03-28 — End: 2014-03-28
  Administered 2014-03-28: 4 mg via INTRAVENOUS

## 2014-03-28 MED ORDER — NEOSTIGMINE METHYLSULFATE 10 MG/10ML IV SOLN
INTRAVENOUS | Status: DC | PRN
  Administered 2014-03-28: 4 mg via INTRAVENOUS

## 2014-03-28 SURGICAL SUPPLY — 42 items
APPLIER CLIP LAPSCP 10X32 DD (CLIP) ×2 IMPLANT
BAG HAMPER (MISCELLANEOUS) ×2 IMPLANT
BLADE 11 SAFETY STRL DISP (BLADE) ×2 IMPLANT
CLOTH BEACON ORANGE TIMEOUT ST (SAFETY) ×2 IMPLANT
COVER LIGHT HANDLE STERIS (MISCELLANEOUS) ×4 IMPLANT
DECANTER SPIKE VIAL GLASS SM (MISCELLANEOUS) ×2 IMPLANT
DURAPREP 26ML APPLICATOR (WOUND CARE) ×2 IMPLANT
ELECT REM PT RETURN 9FT ADLT (ELECTROSURGICAL) ×2
ELECTRODE REM PT RTRN 9FT ADLT (ELECTROSURGICAL) ×1 IMPLANT
FILTER SMOKE EVAC LAPAROSHD (FILTER) ×2 IMPLANT
FORMALIN 10 PREFIL 120ML (MISCELLANEOUS) ×2 IMPLANT
GLOVE BIOGEL M 7.0 STRL (GLOVE) ×4 IMPLANT
GLOVE BIOGEL PI IND STRL 7.0 (GLOVE) ×2 IMPLANT
GLOVE BIOGEL PI INDICATOR 7.0 (GLOVE) ×2
GLOVE EXAM NITRILE MD LF STRL (GLOVE) ×2 IMPLANT
GLOVE SURG SS PI 7.5 STRL IVOR (GLOVE) ×2 IMPLANT
GOWN STRL REUS W/ TWL XL LVL3 (GOWN DISPOSABLE) ×1 IMPLANT
GOWN STRL REUS W/TWL LRG LVL3 (GOWN DISPOSABLE) ×4 IMPLANT
GOWN STRL REUS W/TWL XL LVL3 (GOWN DISPOSABLE) ×1
HEMOSTAT SNOW SURGICEL 2X4 (HEMOSTASIS) ×2 IMPLANT
INST SET LAPROSCOPIC AP (KITS) ×2 IMPLANT
IV NS IRRIG 3000ML ARTHROMATIC (IV SOLUTION) IMPLANT
KIT ROOM TURNOVER APOR (KITS) ×2 IMPLANT
MANIFOLD NEPTUNE II (INSTRUMENTS) ×2 IMPLANT
NEEDLE INSUFFLATION 14GA 120MM (NEEDLE) ×2 IMPLANT
NS IRRIG 1000ML POUR BTL (IV SOLUTION) ×2 IMPLANT
PACK LAP CHOLE LZT030E (CUSTOM PROCEDURE TRAY) ×2 IMPLANT
PAD ARMBOARD 7.5X6 YLW CONV (MISCELLANEOUS) ×2 IMPLANT
POUCH SPECIMEN RETRIEVAL 10MM (ENDOMECHANICALS) ×2 IMPLANT
SET BASIN LINEN APH (SET/KITS/TRAYS/PACK) ×2 IMPLANT
SET TUBE IRRIG SUCTION NO TIP (IRRIGATION / IRRIGATOR) IMPLANT
SLEEVE ENDOPATH XCEL 5M (ENDOMECHANICALS) ×2 IMPLANT
SPONGE GAUZE 2X2 8PLY STRL LF (GAUZE/BANDAGES/DRESSINGS) ×8 IMPLANT
STAPLER VISISTAT (STAPLE) ×2 IMPLANT
SUT VICRYL 0 UR6 27IN ABS (SUTURE) ×2 IMPLANT
TAPE CLOTH SURG 4X10 WHT LF (GAUZE/BANDAGES/DRESSINGS) ×2 IMPLANT
TROCAR ENDO BLADELESS 11MM (ENDOMECHANICALS) ×2 IMPLANT
TROCAR XCEL NON-BLD 5MMX100MML (ENDOMECHANICALS) ×2 IMPLANT
TROCAR XCEL UNIV SLVE 11M 100M (ENDOMECHANICALS) ×2 IMPLANT
TUBING INSUFFLATION (TUBING) ×2 IMPLANT
WARMER LAPAROSCOPE (MISCELLANEOUS) ×2 IMPLANT
YANKAUER SUCT 12FT TUBE ARGYLE (SUCTIONS) ×2 IMPLANT

## 2014-03-28 NOTE — Interval H&P Note (Signed)
History and Physical Interval Note:  03/28/2014 7:25 AM  Kevin Reeves  has presented today for surgery, with the diagnosis of cholelithiasis  The various methods of treatment have been discussed with the patient and family. After consideration of risks, benefits and other options for treatment, the patient has consented to  Procedure(s): LAPAROSCOPIC CHOLECYSTECTOMY (N/A) as a surgical intervention .  The patient's history has been reviewed, patient examined, no change in status, stable for surgery.  I have reviewed the patient's chart and labs.  Questions were answered to the patient's satisfaction.     Franky Macho A

## 2014-03-28 NOTE — Discharge Instructions (Signed)
Restart plavix tomorrow 03/29/14   Laparoscopic Cholecystectomy, Care After Refer to this sheet in the next few weeks. These instructions provide you with information on caring for yourself after your procedure. Your health care provider may also give you more specific instructions. Your treatment has been planned according to current medical practices, but problems sometimes occur. Call your health care provider if you have any problems or questions after your procedure. WHAT TO EXPECT AFTER THE PROCEDURE After your procedure, it is typical to have the following:  Pain at your incision sites. You will be given pain medicines to control the pain.  Mild nausea or vomiting. This should improve after the first 24 hours.  Bloating and possibly shoulder pain from the gas used during the procedure. This will improve after the first 24 hours. HOME CARE INSTRUCTIONS   Change bandages (dressings) as directed by your health care provider.  Keep the wound dry and clean. You may wash the wound gently with soap and water. Gently blot or dab the area dry.  Do not take baths or use swimming pools or hot tubs for 2 weeks or until your health care provider approves.  Only take over-the-counter or prescription medicines as directed by your health care provider.  Continue your normal diet as directed by your health care provider.  Do not lift anything heavier than 10 pounds (4.5 kg) until your health care provider approves.  Do not play contact sports for 1 week or until your health care provider approves. SEEK MEDICAL CARE IF:   You have redness, swelling, or increasing pain in the wound.  You notice yellowish-white fluid (pus) coming from the wound.  You have drainage from the wound that lasts longer than 1 day.  You notice a bad smell coming from the wound or dressing.  Your surgical cuts (incisions) break open. SEEK IMMEDIATE MEDICAL CARE IF:   You develop a rash.  You have difficulty  breathing.  You have chest pain.  You have a fever.  You have increasing pain in the shoulders (shoulder strap areas).  You have dizzy episodes or faint while standing.  You have severe abdominal pain.  You feel sick to your stomach (nauseous) or throw up (vomit) and this lasts for more than 1 day. Document Released: 06/13/2005 Document Revised: 04/03/2013 Document Reviewed: 01/23/2013 [facility] Patient Information 2015 [place], [place]. This information is not intended to replace advice given to you by your health care provider. Make sure you discuss any questions you have with your health care provider.

## 2014-03-28 NOTE — Op Note (Signed)
Patient:  Kevin Reeves  DOB:  [birth year]  MRN:  [id number]   Preop Diagnosis:  Cholecystitis, cholelithiasis  Postop Diagnosis:  Same  Procedure:  Laparoscopic cholecystectomy  Surgeon:  Franky Macho, M.D.  Anes:  General endotracheal  Indications:  Patient is a 73 year old white male who presents with biliary colic secondary to cholelithiasis. The risks and benefits of the procedure including bleeding, infection, hepatobiliary injury, and the possibility of an open procedure were fully explained to the patient, who gave informed consent.  Procedure note:  The patient is placed the supine position. After induction of general endotracheal anesthesia, the abdomen was prepped and draped using usual sterile technique with DuraPrep. Surgical site confirmation was performed.  A supraumbilical incision was made down to the fascia. A Veress needle was introduced into the abdominal cavity and confirmation of placement was done using the saline drop test. The abdomen was insufflated to 16 mm mercury pressure. An 11 mm trocar was introduced into the abdominal cavity under direct visualization without difficulty. The patient was placed in reverse Trendelenburg position and additional 11 mm trocar was placed the epigastric region and 5 mm trochars were placed in the right upper quadrant and right flank regions. Liver was inspected and noted within normal limits. The gallbladder was retracted dynamic fashion in order to expose the triangle of Calot. The cystic duct was first identified. Its junction to the infundibulum was fully identified. Endoclips placed proximally and distally on the cystic duct, and the cystic duct was divided. This was likewise done cystic artery. The gallbladder was freed away from fossa using Bovie electrocautery. The gallbladder was delivered through the epigastric trocar site using an Endo Catch bag. The gallbladder fossa was inspected no abnormal bleeding or bile leakage was  noted. Surgicel is placed the gallbladder fossa. All fluid and air were then evacuated from the abdominal cavity prior to removal of the trochars.  All wounds were irrigated with normal saline. All wounds were injected with 0.5% Sensorcaine. The supraumbilical fascia as well as epigastric fascia reapproximated using 0 Vicryl interrupted sutures. All skin incisions were closed using staples. Betadine ointment and dry sterile dressings were applied.  All tape and needle counts were correct at the end of the procedure. Patient was extubated in the operating room and transferred to PACU in stable condition.  Complications:  None  EBL:  Minimal  Specimen:  Gallbladder

## 2014-03-28 NOTE — Transfer of Care (Signed)
Immediate Anesthesia Transfer of Care Note  Patient: Kevin Reeves  Procedure(s) Performed: Procedure(s): LAPAROSCOPIC CHOLECYSTECTOMY (N/A)  Patient Location: PACU  Anesthesia Type:General  Level of Consciousness: awake and patient cooperative  Airway & Oxygen Therapy: Patient Spontanous Breathing and Patient connected to face mask oxygen  Post-op Assessment: Report given to PACU RN, Post -op Vital signs reviewed and stable and Patient moving all extremities  Post vital signs: Reviewed and stable  Complications: No apparent anesthesia complications

## 2014-03-28 NOTE — Anesthesia Preprocedure Evaluation (Signed)
Anesthesia Evaluation  Patient identified by MRN, date of birth, ID band Patient awake    Reviewed: Allergy & Precautions, H&P , NPO status , Patient's Chart, lab work & pertinent test results, reviewed documented beta blocker date and time   Airway Mallampati: I TM Distance: >3 FB Neck ROM: Full    Dental  (+) Teeth Intact   Pulmonary neg pulmonary ROS,  breath sounds clear to auscultation        Cardiovascular hypertension, Pt. on medications and Pt. on home beta blockers - angina+ CAD, + Past MI, + Cardiac Stents and + Peripheral Vascular Disease Rhythm:Regular Rate:Normal     Neuro/Psych    GI/Hepatic GERD-  Medicated and Controlled,  Endo/Other    Renal/GU      Musculoskeletal  (+) Arthritis -,   Abdominal   Peds  Hematology   Anesthesia Other Findings   Reproductive/Obstetrics                           Anesthesia Physical Anesthesia Plan  ASA: III  Anesthesia Plan: General   Post-op Pain Management:    Induction: Intravenous and Rapid sequence  Airway Management Planned: Oral ETT  Additional Equipment:   Intra-op Plan:   Post-operative Plan: Extubation in OR  Informed Consent: I have reviewed the patients History and Physical, chart, labs and discussed the procedure including the risks, benefits and alternatives for the proposed anesthesia with the patient or authorized representative who has indicated his/her understanding and acceptance.     Plan Discussed with:   Anesthesia Plan Comments:         Anesthesia Quick Evaluation

## 2014-03-28 NOTE — Anesthesia Procedure Notes (Signed)
Procedure Name: Intubation Date/Time: 03/28/2014 7:43 AM Performed by: Patrcia Dolly, Hoy Fallert Pre-anesthesia Checklist: Patient identified, Patient being monitored, Timeout performed, Emergency Drugs available and Suction available Patient Re-evaluated:Patient Re-evaluated prior to inductionOxygen Delivery Method: Circle System Utilized Preoxygenation: Pre-oxygenation with 100% oxygen Intubation Type: IV induction Ventilation: Mask ventilation without difficulty Laryngoscope Size: Miller and 2 Grade View: Grade I Tube type: Oral Tube size: 7.0 mm Number of attempts: 1 Airway Equipment and Method: stylet Placement Confirmation: ETT inserted through vocal cords under direct vision,  positive ETCO2 and breath sounds checked- equal and bilateral Secured at: 21 cm Tube secured with: Tape Dental Injury: Teeth and Oropharynx as per pre-operative assessment

## 2014-03-28 NOTE — Transfer of Care (Signed)
Immediate Anesthesia Transfer of Care Note  Patient: Kevin Reeves  Procedure(s) Performed: Procedure(s): LAPAROSCOPIC CHOLECYSTECTOMY (N/A)  Patient Location: PACU  Anesthesia Type:General  Level of Consciousness: awake, sedated, patient cooperative and responds to stimulation  Airway & Oxygen Therapy: Patient Spontanous Breathing and Patient connected to face mask oxygen  Post-op Assessment: Report given to PACU RN, Post -op Vital signs reviewed and stable and Patient moving all extremities  Post vital signs: Reviewed and stable  Complications: No apparent anesthesia complications

## 2014-03-28 NOTE — Anesthesia Postprocedure Evaluation (Signed)
  Anesthesia Post-op Note  Patient: Kevin Reeves  Procedure(s) Performed: Procedure(s): LAPAROSCOPIC CHOLECYSTECTOMY (N/A)  Patient Location: PACU  Anesthesia Type:General  Level of Consciousness: awake, patient cooperative and responds to stimulation  Airway and Oxygen Therapy: Patient Spontanous Breathing and Patient connected to face mask oxygen  Post-op Pain: none  Post-op Assessment: Post-op Vital signs reviewed, Patient's Cardiovascular Status Stable, Respiratory Function Stable, Patent Airway and No signs of Nausea or vomiting  Post-op Vital Signs: Reviewed and stable  Last Vitals:  Filed Vitals:   03/28/14 0825  BP: 154/32  Pulse: 67  Temp: 36.4 C  Resp: 18    Complications: No apparent anesthesia complications

## 2014-03-28 NOTE — Addendum Note (Signed)
Addendum created 03/28/14 1221 by Moshe Salisbury, CRNA   Modules edited: Anesthesia Flowsheet, Charges VN

## 2014-04-08 ENCOUNTER — Encounter: Payer: Self-pay | Admitting: Family

## 2014-04-09 ENCOUNTER — Ambulatory Visit (INDEPENDENT_AMBULATORY_CARE_PROVIDER_SITE_OTHER): Payer: Medicare HMO | Admitting: Family

## 2014-04-09 ENCOUNTER — Encounter: Payer: Self-pay | Admitting: Family

## 2014-04-09 ENCOUNTER — Ambulatory Visit (HOSPITAL_COMMUNITY)
Admission: RE | Admit: 2014-04-09 | Discharge: 2014-04-09 | Disposition: A | Discharge status: Home / Self Care | Payer: Medicare HMO | Source: Ambulatory Visit | Attending: Family | Admitting: Family

## 2014-04-09 VITALS — BP 104/70 | HR 64 | Resp 16 | Ht 68.0 in | Wt 190.0 lb

## 2014-04-09 DIAGNOSIS — Z48812 Encounter for surgical aftercare following surgery on the circulatory system: Secondary | ICD-10-CM | POA: Diagnosis not present

## 2014-04-09 DIAGNOSIS — I6521 Occlusion and stenosis of right carotid artery: Secondary | ICD-10-CM

## 2014-04-09 DIAGNOSIS — I6529 Occlusion and stenosis of unspecified carotid artery: Secondary | ICD-10-CM | POA: Insufficient documentation

## 2014-04-09 NOTE — Progress Notes (Signed)
Established Carotid Patient   History of Present Illness  Kevin Reeves is a 73 y.o. male patient of Dr. Hart Rochester who is status post right CEA in October 2007 and returns today for follow up.  The patient has negative history of TIA or stroke symptoms, specifically the patient denies a history of amaurosis fugax or monocular blindness, denies a history unilateral  of facial drooping, denies a history of hemiplegia, and denies a history of receptive or expressive aphasia.    He denies claudication symptoms in legs with walking.  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  Pt Diabetic: No Pt smoker: non-smoker  Pt meds include: Statin : No: pravastatin was stopped by his PCP due to liver being inflamed from his gall bladder, the plan is to resume at some point  ASA: Yes Other anticoagulants/antiplatelets: Plavix   Past Medical History  Diagnosis Date  . Arteriosclerotic cardiovascular disease (ASCVD)     Stent to RI in 2004. Posterior MI in 4/07 with thrombosis of RI at stent site while off Plavix --> reintervention; do not d/c plavix  . Cerebrovascular disease     Left carotid bruit with 40-59% ICA stenosis; right carotid endarectomy in 2008  . Hypertension   . Colonic polyp 1999    Adenoma resected in 1999; history of diverticulosis  . Gastroesophageal reflux disease     Dilation of esophagel stricture in 2008  . Hyperlipidemia   . Cholelithiasis 07/21/2009    Asymptomatic  . 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 Arthritis     Social History History  Substance Use Topics  . Smoking status: Never Smoker   . Smokeless tobacco: Never Used  . Alcohol Use: No    Family History Family History  Problem Relation Age of Onset  . Heart disease Father     Before age 62  . Hyperlipidemia Father   . Hypertension Father   .  Stroke Other     Grandparent  . Heart attack Other     Grandparent  . Heart block      Brother  . Heart disease Paternal Grandmother   . Heart disease Paternal Grandfather   . Heart disease Brother   . Heart attack Brother   . Heart disease Brother     Before age 1-  Quad-BPG  . Hyperlipidemia Brother   . Hypertension Brother   . Heart attack Brother     Surgical History Past Surgical History  Procedure Laterality Date  . Carotid endarterectomy  2008    Right  . Colonoscopy  2008    adenoma resected in 1999; no intervention in 2008  . Cardiac catheterization      cardiac stent placed  . Cholecystectomy N/A 03/28/2014    Procedure: LAPAROSCOPIC CHOLECYSTECTOMY;  Surgeon: Dalia Heading, MD;  Location: [facility];  Service: General;  Laterality: N/A;    No Known Allergies  Current Outpatient Prescriptions  Medication Sig Dispense Refill  . aspirin EC 81 MG tablet Take 81 mg by mouth daily.      . clopidogrel (PLAVIX) 75 MG tablet Take 75 mg by mouth daily.        . metoprolol succinate (TOPROL-XL) 25 MG 24 hr tablet Take 25 mg by mouth daily.        . Multiple Vitamin (MULTIVITAMIN) tablet Take 1 tablet by mouth daily.        Marland Kitchen  NITROSTAT 0.4 MG SL tablet DISSOLVE 1 TABLET UNDER  TONGUE EVERY 5 MINUTES UPTO 15 MIN FOR CHEST PAIN.IF NO RELIEF CALL 911.  25 each  6  . omeprazole (PRILOSEC) 20 MG capsule Take 20 mg by mouth daily.      . pravastatin (PRAVACHOL) 20 MG tablet Take 1 tablet (20 mg total) by mouth every evening.  90 tablet  0  . ramipril (ALTACE) 10 MG capsule Take 10 mg by mouth daily.        Marland Kitchen HYDROcodone-acetaminophen (NORCO/VICODIN) 5-325 MG per tablet Take 1 tablet by mouth every 4 (four) hours as needed for moderate pain.  50 tablet  0   No current facility-administered medications for this visit.    Review of Systems : See HPI for pertinent positives and negatives.  Physical Examination  Filed Vitals:   04/09/14 1032 04/09/14 1035  BP: 103/74 104/70   Pulse: 69 64  Resp:  16  Height:  5\' 8"  (1.727 m)  Weight:  190 lb (86.183 kg)  SpO2:  97%   Body mass index is 28.9 kg/(m^2).  General: WDWN male in NAD GAIT: normal Eyes: PERRLA Pulmonary:  Non-labored, CTAB, Negative  Rales, Negative rhonchi, & Negative wheezing.  Cardiac: regular Rhythm,  Negative detected murmur.  VASCULAR EXAM Carotid Bruits Right Left   Negative Negative    Radial pulses are 2+ palpable and equal.                                                                                                                            LE Pulses Right Left       POPLITEAL  not palpable   not palpable       POSTERIOR TIBIAL  not palpable   not palpable        DORSALIS PEDIS      ANTERIOR TIBIAL not palpable  not palpable     Gastrointestinal: soft, nontender, BS WNL, no r/g,  negative masses palpated.  Musculoskeletal: Negative muscle atrophy/wasting. M/S 5/5 throughout, Extremities without ischemic changes.  Neurologic: A&O X 3; Appropriate Affect ; SENSATION ;normal;  Speech is normal CN 2-12 intact, Pain and light touch intact in extremities, Motor exam as listed above.   Non-Invasive Vascular Imaging CAROTID DUPLEX 04/09/2014   CEREBROVASCULAR DUPLEX EVALUATION    INDICATION: Carotid artery disease     PREVIOUS INTERVENTION(S): Right carotid endarterectomy 04/17/2006.    DUPLEX EXAM:     RIGHT  LEFT  Peak Systolic Velocities (cm/s) End Diastolic Velocities (cm/s) Plaque LOCATION Peak Systolic Velocities (cm/s) End Diastolic Velocities (cm/s) Plaque  89 24  CCA PROXIMAL 82 22   66 21  CCA MID 73 29   66 22 HM CCA DISTAL 92 35 HT  102 11 HM ECA 247 58 HT  62 25 HM ICA PROXIMAL 170 55 HT  99 32  ICA MID 192 58 HT  110 48  ICA DISTAL 77 28  ICA / CCA Ratio (PSV) 2.63  Antegrade  Vertebral Flow Antegrade   138 Brachial Systolic Pressure (mmHg) 144  Triphasic  Brachial Artery Waveforms Triphasic     Plaque Morphology:  HM = Homogeneous, HT  = Heterogeneous, CP = Calcific Plaque, SP = Smooth Plaque, IP = Irregular Plaque     ADDITIONAL FINDINGS:     IMPRESSION: Patent right carotid endarterectomy site with evidence of mild hyperplasia. Left internal carotid artery velocities suggest a 40-59% stenosis.      Compared to the previous exam:  No significant change in comparison to the last exam on 03/19/2013.     Assessment: KARSTON HYLAND is a 73 y.o. male who is status post right CEA in October 2007 and presents with asymptomatic patent right carotid endarterectomy site with evidence of mild hyperplasia and left internal carotid artery velocities suggest a 40-59% stenosis.   No significant change in comparison to the last exam on 03/19/2013.       Plan: Follow-up in 1 year with Carotid Duplex scan.   I discussed in depth with the patient the nature of atherosclerosis, and emphasized the importance of maximal medical management including strict control of blood pressure, blood glucose, and lipid levels, obtaining regular exercise, and continued cessation of smoking.  The patient is aware that without maximal medical management the underlying atherosclerotic disease process will progress, limiting the benefit of any interventions. The patient was given information about stroke prevention and what symptoms should prompt the patient to seek immediate medical care. Thank you for allowing [place] to participate in this patient's care.  Charisse March, RN, MSN, FNP-C [facility] Office: [phone]  Clinic Physician: Edilia Bo  04/09/2014 10:40 AM

## 2014-04-09 NOTE — Patient Instructions (Signed)
Stroke Prevention Some medical conditions and behaviors are associated with an increased chance of having a stroke. You may prevent a stroke by making healthy choices and managing medical conditions. HOW CAN I REDUCE MY RISK OF HAVING A STROKE?   Stay physically active. Get at least 30 minutes of activity on most or all days.  Do not smoke. It may also be helpful to avoid exposure to secondhand smoke.  Limit alcohol use. Moderate alcohol use is considered to be:  No more than 2 drinks per day for men.  No more than 1 drink per day for nonpregnant women.  Eat healthy foods. This involves:  Eating 5 or more servings of fruits and vegetables a day.  Making dietary changes that address high blood pressure (hypertension), high cholesterol, diabetes, or obesity.  Manage your cholesterol levels.  Making food choices that are high in fiber and low in saturated fat, trans fat, and cholesterol may control cholesterol levels.  Take any prescribed medicines to control cholesterol as directed by your health care provider.  Manage your diabetes.  Controlling your carbohydrate and sugar intake is recommended to manage diabetes.  Take any prescribed medicines to control diabetes as directed by your health care provider.  Control your hypertension.  Making food choices that are low in salt (sodium), saturated fat, trans fat, and cholesterol is recommended to manage hypertension.  Take any prescribed medicines to control hypertension as directed by your health care provider.  Maintain a healthy weight.  Reducing calorie intake and making food choices that are low in sodium, saturated fat, trans fat, and cholesterol are recommended to manage weight.  Stop drug abuse.  Avoid taking birth control pills.  Talk to your health care provider about the risks of taking birth control pills if you are over 35 years old, smoke, get migraines, or have ever had a blood clot.  Get evaluated for sleep  disorders (sleep apnea).  Talk to your health care provider about getting a sleep evaluation if you snore a lot or have excessive sleepiness.  Take medicines only as directed by your health care provider.  For some people, aspirin or blood thinners (anticoagulants) are helpful in reducing the risk of forming abnormal blood clots that can lead to stroke. If you have the irregular heart rhythm of atrial fibrillation, you should be on a blood thinner unless there is a good reason you cannot take them.  Understand all your medicine instructions.  Make sure that other conditions (such as anemia or atherosclerosis) are addressed. SEEK IMMEDIATE MEDICAL CARE IF:   You have sudden weakness or numbness of the face, arm, or leg, especially on one side of the body.  Your face or eyelid droops to one side.  You have sudden confusion.  You have trouble speaking (aphasia) or understanding.  You have sudden trouble seeing in one or both eyes.  You have sudden trouble walking.  You have dizziness.  You have a loss of balance or coordination.  You have a sudden, severe headache with no known cause.  You have new chest pain or an irregular heartbeat. Any of these symptoms may represent a serious problem that is an emergency. Do not wait to see if the symptoms will go away. Get medical help at once. Call your local emergency services (911 in [place] Do not drive yourself to the hospital. Document Released: 07/21/2004 Document Revised: 10/28/2013 Document Reviewed: 12/14/2012 [employer] Patient Information 2015 [employer]. This information is not intended to replace advice given   to you by your health care provider. Make sure you discuss any questions you have with your health care provider.  

## 2014-04-10 NOTE — Addendum Note (Signed)
Addended by: Sharee Pimple on: 04/10/2014 03:52 PM   Modules accepted: Orders

## 2014-05-09 ENCOUNTER — Emergency Department (HOSPITAL_COMMUNITY)
Admission: EM | Admit: 2014-05-09 | Discharge: 2014-05-09 | Disposition: A | Discharge status: Home / Self Care | Payer: Medicare HMO | Attending: Emergency Medicine | Admitting: Emergency Medicine

## 2014-05-09 ENCOUNTER — Emergency Department (HOSPITAL_COMMUNITY): Payer: Medicare HMO

## 2014-05-09 ENCOUNTER — Encounter (HOSPITAL_COMMUNITY): Payer: Self-pay | Admitting: Emergency Medicine

## 2014-05-09 DIAGNOSIS — R188 Other ascites: Secondary | ICD-10-CM | POA: Diagnosis not present

## 2014-05-09 DIAGNOSIS — I252 Old myocardial infarction: Secondary | ICD-10-CM | POA: Insufficient documentation

## 2014-05-09 DIAGNOSIS — Z9049 Acquired absence of other specified parts of digestive tract: Secondary | ICD-10-CM

## 2014-05-09 DIAGNOSIS — I10 Essential (primary) hypertension: Secondary | ICD-10-CM | POA: Insufficient documentation

## 2014-05-09 DIAGNOSIS — K219 Gastro-esophageal reflux disease without esophagitis: Secondary | ICD-10-CM | POA: Diagnosis not present

## 2014-05-09 DIAGNOSIS — M1990 Unspecified osteoarthritis, unspecified site: Secondary | ICD-10-CM | POA: Diagnosis not present

## 2014-05-09 DIAGNOSIS — Z79899 Other long term (current) drug therapy: Secondary | ICD-10-CM | POA: Insufficient documentation

## 2014-05-09 DIAGNOSIS — R05 Cough: Secondary | ICD-10-CM | POA: Insufficient documentation

## 2014-05-09 DIAGNOSIS — E785 Hyperlipidemia, unspecified: Secondary | ICD-10-CM | POA: Diagnosis not present

## 2014-05-09 DIAGNOSIS — Z9889 Other specified postprocedural states: Secondary | ICD-10-CM | POA: Insufficient documentation

## 2014-05-09 DIAGNOSIS — Z7982 Long term (current) use of aspirin: Secondary | ICD-10-CM | POA: Insufficient documentation

## 2014-05-09 DIAGNOSIS — R630 Anorexia: Secondary | ICD-10-CM | POA: Insufficient documentation

## 2014-05-09 DIAGNOSIS — Z9089 Acquired absence of other organs: Secondary | ICD-10-CM | POA: Insufficient documentation

## 2014-05-09 DIAGNOSIS — Z8673 Personal history of transient ischemic attack (TIA), and cerebral infarction without residual deficits: Secondary | ICD-10-CM | POA: Diagnosis not present

## 2014-05-09 DIAGNOSIS — R251 Tremor, unspecified: Secondary | ICD-10-CM | POA: Diagnosis not present

## 2014-05-09 DIAGNOSIS — Z86010 Personal history of colonic polyps: Secondary | ICD-10-CM | POA: Insufficient documentation

## 2014-05-09 DIAGNOSIS — G8918 Other acute postprocedural pain: Secondary | ICD-10-CM | POA: Insufficient documentation

## 2014-05-09 DIAGNOSIS — R1011 Right upper quadrant pain: Secondary | ICD-10-CM

## 2014-05-09 DIAGNOSIS — R0602 Shortness of breath: Secondary | ICD-10-CM | POA: Diagnosis not present

## 2014-05-09 DIAGNOSIS — I2510 Atherosclerotic heart disease of native coronary artery without angina pectoris: Secondary | ICD-10-CM | POA: Insufficient documentation

## 2014-05-09 DIAGNOSIS — Z7902 Long term (current) use of antithrombotics/antiplatelets: Secondary | ICD-10-CM | POA: Diagnosis not present

## 2014-05-09 LAB — LIPASE, BLOOD: Lipase: 35 U/L (ref 11–59)

## 2014-05-09 LAB — CBC WITH DIFFERENTIAL/PLATELET
Basophils Absolute: 0 10*3/uL (ref 0.0–0.1)
Basophils Relative: 0 % (ref 0–1)
Eosinophils Absolute: 0.2 10*3/uL (ref 0.0–0.7)
Eosinophils Relative: 2 % (ref 0–5)
HCT: 37.5 % — ABNORMAL LOW (ref 39.0–52.0)
Hemoglobin: 12.5 g/dL — ABNORMAL LOW (ref 13.0–17.0)
Lymphocytes Relative: 17 % (ref 12–46)
Lymphs Abs: 2.2 10*3/uL (ref 0.7–4.0)
MCH: 29.8 pg (ref 26.0–34.0)
MCHC: 33.3 g/dL (ref 30.0–36.0)
MCV: 89.5 fL (ref 78.0–100.0)
Monocytes Absolute: 1.7 10*3/uL — ABNORMAL HIGH (ref 0.1–1.0)
Monocytes Relative: 14 % — ABNORMAL HIGH (ref 3–12)
Neutro Abs: 8.5 10*3/uL — ABNORMAL HIGH (ref 1.7–7.7)
Neutrophils Relative %: 67 % (ref 43–77)
Platelets: 418 10*3/uL — ABNORMAL HIGH (ref 150–400)
RBC: 4.19 MIL/uL — ABNORMAL LOW (ref 4.22–5.81)
RDW: 12.7 % (ref 11.5–15.5)
WBC: 12.6 10*3/uL — ABNORMAL HIGH (ref 4.0–10.5)

## 2014-05-09 LAB — COMPREHENSIVE METABOLIC PANEL
ALT: 138 U/L — ABNORMAL HIGH (ref 0–53)
AST: 94 U/L — ABNORMAL HIGH (ref 0–37)
Albumin: 3.2 g/dL — ABNORMAL LOW (ref 3.5–5.2)
Alkaline Phosphatase: 266 U/L — ABNORMAL HIGH (ref 39–117)
Anion gap: 15 (ref 5–15)
BUN: 21 mg/dL (ref 6–23)
CO2: 24 mEq/L (ref 19–32)
Calcium: 9.8 mg/dL (ref 8.4–10.5)
Chloride: 95 mEq/L — ABNORMAL LOW (ref 96–112)
Creatinine, Ser: 1.24 mg/dL (ref 0.50–1.35)
GFR calc Af Amer: 65 mL/min — ABNORMAL LOW (ref >90)
GFR calc non Af Amer: 56 mL/min — ABNORMAL LOW (ref >90)
Glucose, Bld: 136 mg/dL — ABNORMAL HIGH (ref 70–99)
Potassium: 4.5 mEq/L (ref 3.7–5.3)
Sodium: 134 mEq/L — ABNORMAL LOW (ref 137–147)
Total Bilirubin: 0.9 mg/dL (ref 0.3–1.2)
Total Protein: 8.4 g/dL — ABNORMAL HIGH (ref 6.0–8.3)

## 2014-05-09 LAB — TROPONIN I: Troponin I: <0.3 ng/mL (ref <0.30)

## 2014-05-09 MED ORDER — METRONIDAZOLE IN NACL 5-0.79 MG/ML-% IV SOLN
500.0000 mg | Freq: Once | INTRAVENOUS | Status: AC
  Administered 2014-05-09: 500 mg via INTRAVENOUS
  Filled 2014-05-09: qty 100

## 2014-05-09 MED ORDER — HYDROMORPHONE HCL 1 MG/ML IJ SOLN
1.0000 mg | Freq: Once | INTRAMUSCULAR | Status: AC
  Administered 2014-05-09: 1 mg via INTRAVENOUS
  Filled 2014-05-09: qty 1

## 2014-05-09 MED ORDER — CIPROFLOXACIN HCL 500 MG PO TABS: 500.0000 mg | ORAL_TABLET | Freq: Two times a day (BID) | ORAL | Status: DC

## 2014-05-09 MED ORDER — IOHEXOL 300 MG/ML  SOLN
100.0000 mL | Freq: Once | INTRAMUSCULAR | Status: AC | PRN
  Administered 2014-05-09: 100 mL via INTRAVENOUS

## 2014-05-09 MED ORDER — CIPROFLOXACIN IN D5W 400 MG/200ML IV SOLN
400.0000 mg | Freq: Once | INTRAVENOUS | Status: AC
  Administered 2014-05-09: 400 mg via INTRAVENOUS
  Filled 2014-05-09: qty 200

## 2014-05-09 MED ORDER — ONDANSETRON HCL 4 MG/2ML IJ SOLN
4.0000 mg | Freq: Once | INTRAMUSCULAR | Status: AC
  Administered 2014-05-09: 4 mg via INTRAMUSCULAR
  Filled 2014-05-09: qty 2

## 2014-05-09 MED ORDER — OXYCODONE-ACETAMINOPHEN 5-325 MG PO TABS: 1.0000 | ORAL_TABLET | ORAL | Status: DC | PRN

## 2014-05-09 MED ORDER — METRONIDAZOLE 500 MG PO TABS: 500.0000 mg | ORAL_TABLET | Freq: Three times a day (TID) | ORAL | Status: DC

## 2014-05-09 NOTE — Discharge Instructions (Signed)
Do not take plavix until you see Dr Lovell Sheehan on Tuesday.    Abdominal Pain Many things can cause abdominal pain. Usually, abdominal pain is not caused by a disease and will improve without treatment. It can often be observed and treated at home. Your health care provider will do a physical exam and possibly order blood tests and X-rays to help determine the seriousness of your pain. However, in many cases, more time must pass before a clear cause of the pain can be found. Before that point, your health care provider may not know if you need more testing or further treatment. HOME CARE INSTRUCTIONS  Monitor your abdominal pain for any changes. The following actions may help to alleviate any discomfort you are experiencing:  Only take over-the-counter or prescription medicines as directed by your health care provider.  Do not take laxatives unless directed to do so by your health care provider.  Try a clear liquid diet (broth, tea, or water) as directed by your health care provider. Slowly move to a bland diet as tolerated. SEEK MEDICAL CARE IF:  You have unexplained abdominal pain.  You have abdominal pain associated with nausea or diarrhea.  You have pain when you urinate or have a bowel movement.  You experience abdominal pain that wakes you in the night.  You have abdominal pain that is worsened or improved by eating food.  You have abdominal pain that is worsened with eating fatty foods.  You have a fever. SEEK IMMEDIATE MEDICAL CARE IF:   Your pain does not go away within 2 hours.  You keep throwing up (vomiting).  Your pain is felt only in portions of the abdomen, such as the right side or the left lower portion of the abdomen.  You pass bloody or black tarry stools. MAKE SURE YOU:  Understand these instructions.   Will watch your condition.   Will get help right away if you are not doing well or get worse.  Document Released: 03/23/2005 Document Revised: 06/18/2013  Document Reviewed: 02/20/2013 [facility] Patient Information 2015 [place], [place]. This information is not intended to replace advice given to you by your health care provider. Make sure you discuss any questions you have with your health care provider. lavix until you see Dr Lovell Sheehan On Tuesday.

## 2014-05-09 NOTE — ED Notes (Addendum)
Pt reports had gallbladder removed x5 weeks ago. Pt reports right sided abdominal pain and sob x1 week. Pt reports loss of appetite. Pt denies any n/v/d. Moderate dyspnea noted in triage. Pt reports "hurts to take a deep breath" and increase in acid reflux and belching with pain onset as well.

## 2014-05-09 NOTE — ED Provider Notes (Signed)
CSN: [id number]     Arrival date & time 05/09/14  1322 History  This chart was scribed for Raeford Razor, MD by Bronson Curb, ED Scribe. This patient was seen in room [facility]18/[facility]18 and the patient's care was started at 2:36 PM.   Chief Complaint  Patient presents with  . Abdominal Pain    The history is provided by the patient. No language interpreter was used.    HPI Comments: Kevin Reeves is a 73 y.o. male who presents to the Emergency Department complaining of gradually worsening, sharp, abdominal pain for the past 2 weeks. Patient reports recent history of cholecystectomy 5 weeks ago, and reports no complications for 3 weeks. However, he reports gradually worsening abdominal pain for past the last 2 weeks. He rates the pain a 4/10 at best and 9/10 at worst. There is associated SOB, loss appetite, and increased belching and GERD symptoms. Wife also notes tremors since patient's arrival. Patient states the pain is worse with cough and deep breathing. He denies nausea, vomiting, diarrhea, constipation,fever, back pain, leg pain, or leg swelling. Patient is established with a PCP, however, was unable to be seen until next week.  PCP: Dr. Sherwood Gambler   Past Medical History  Diagnosis Date  . Arteriosclerotic cardiovascular disease (ASCVD)     Stent to RI in 2004. Posterior MI in 4/07 with thrombosis of RI at stent site while off Plavix --> reintervention; do not d/c plavix  . Cerebrovascular disease     Left carotid bruit with 40-59% ICA stenosis; right carotid endarectomy in 2008  . Hypertension   . Colonic polyp 1999    Adenoma resected in 1999; history of diverticulosis  . Gastroesophageal reflux disease     Dilation of esophagel stricture in 2008  . Hyperlipidemia   . Cholelithiasis 07/21/2009    Asymptomatic  . 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 Arthritis    Past Surgical History  Procedure Laterality Date  . Carotid endarterectomy  2008    Right  . Colonoscopy  2008    adenoma  resected in 1999; no intervention in 2008  . Cardiac catheterization      cardiac stent placed  . Cholecystectomy N/A 03/28/2014    Procedure: LAPAROSCOPIC CHOLECYSTECTOMY;  Surgeon: Dalia Heading, MD;  Location: [facility];  Service: General;  Laterality: N/A;   Family History  Problem Relation Age of Onset  . Heart disease Father     Before age 79  . Hyperlipidemia Father   . Hypertension Father   . Stroke Other     Grandparent  . Heart attack Other     Grandparent  . Heart block      Brother  . Heart disease Paternal Grandmother   . Heart disease Paternal Grandfather   . Heart disease Brother   . Heart attack Brother   . Heart disease Brother     Before age 75-  Quad-BPG  . Hyperlipidemia Brother   . Hypertension Brother   . Heart attack Brother    History  Substance Use Topics  . Smoking status: Never Smoker   . Smokeless tobacco: Never Used  . Alcohol Use: No    Review of Systems  Constitutional: Positive for appetite change (decreased). Negative for fever.  Respiratory: Positive for shortness of breath.   Gastrointestinal: Positive for abdominal pain. Negative for nausea, vomiting, diarrhea and constipation.  Neurological: Positive for tremors.  All other systems reviewed and are negative.     Allergies  Review of patient's allergies  indicates no known allergies.  Home Medications   Prior to Admission medications   Medication Sig Start Date End Date Taking? Authorizing Provider  aspirin EC 81 MG tablet Take 81 mg by mouth daily.    Historical Provider, MD  clopidogrel (PLAVIX) 75 MG tablet Take 75 mg by mouth daily.      Historical Provider, MD  HYDROcodone-acetaminophen (NORCO/VICODIN) 5-325 MG per tablet Take 1 tablet by mouth every 4 (four) hours as needed for moderate pain. 03/28/14 03/28/15  Dalia Heading, MD  metoprolol succinate (TOPROL-XL) 25 MG 24 hr tablet Take 25 mg by mouth daily.      Historical Provider, MD  Multiple Vitamin (MULTIVITAMIN) tablet  Take 1 tablet by mouth daily.      Historical Provider, MD  NITROSTAT 0.4 MG SL tablet DISSOLVE 1 TABLET UNDER  TONGUE EVERY 5 MINUTES UPTO 15 MIN FOR CHEST PAIN.IF NO RELIEF CALL 911. 02/22/11   Kathlen Brunswick, MD  omeprazole (PRILOSEC) 20 MG capsule Take 20 mg by mouth daily.    Historical Provider, MD  pravastatin (PRAVACHOL) 20 MG tablet Take 1 tablet (20 mg total) by mouth every evening. 03/24/14   Antoine Poche, MD  ramipril (ALTACE) 10 MG capsule Take 10 mg by mouth daily.      Historical Provider, MD   Triage Vitals: BP 109/78 mmHg  Pulse 105  Temp(Src) 98.2 F (36.8 C) (Oral)  Ht 5\' 8"  (1.727 m)  Wt 186 lb (84.369 kg)  BMI 28.29 kg/m2  SpO2 99%  Physical Exam  Constitutional: He is oriented to person, place, and time. He appears well-developed and well-nourished. No distress.  Appears uncomfortable.  HENT:  Head: Normocephalic and atraumatic.  Eyes: Conjunctivae and EOM are normal.  Neck: Neck supple. No tracheal deviation present.  Cardiovascular: Normal rate, regular rhythm and normal heart sounds.   Pulmonary/Chest: Effort normal. Tachypnea noted. No respiratory distress.  Mild tachypnea.  Abdominal: He exhibits no distension. There is tenderness in the right upper quadrant. There is guarding. There is no rebound.  RUQ tenderness with voluntary guarding. No rebound or distention.  Musculoskeletal: Normal range of motion.  Neurological: He is alert and oriented to person, place, and time.  Skin: Skin is warm and dry.  Psychiatric: He has a normal mood and affect. His behavior is normal.  Nursing note and vitals reviewed.   ED Course  Procedures (including critical care time)  DIAGNOSTIC STUDIES: Oxygen Saturation is 99% on room air, normal by my interpretation.    COORDINATION OF CARE: At 1441 Discussed treatment plan with patient which includes labs. Patient agrees.   Labs Review Labs Reviewed  CBC WITH DIFFERENTIAL - Abnormal; Notable for the following:     WBC 12.6 (*)    RBC 4.19 (*)    Hemoglobin 12.5 (*)    HCT 37.5 (*)    Platelets 418 (*)    Neutro Abs 8.5 (*)    Monocytes Relative 14 (*)    Monocytes Absolute 1.7 (*)    All other components within normal limits  COMPREHENSIVE METABOLIC PANEL - Abnormal; Notable for the following:    Sodium 134 (*)    Chloride 95 (*)    Glucose, Bld 136 (*)    Total Protein 8.4 (*)    Albumin 3.2 (*)    AST 94 (*)    ALT 138 (*)    Alkaline Phosphatase 266 (*)    GFR calc non Af Amer 56 (*)    GFR calc  Af Amer 65 (*)    All other components within normal limits  TROPONIN I  LIPASE, BLOOD    Imaging Review Dg Chest 2 View  05/09/2014   CLINICAL DATA:  Right upper quadrant pain for 1-2 weeks. Shortness of breath. Symptoms are getting worse. Gallbladder if the 5 weeks ago.  EXAM: CHEST  2 VIEW  COMPARISON:  04/13/2006  FINDINGS: Shallow lung inflation. Mild elevation of the right hemidiaphragm. Heart size is normal. There is bibasilar atelectasis, right greater than left. No focal consolidations. No pleural effusions. No pulmonary edema. Degenerative changes are seen in the mid thoracic spine.  IMPRESSION: Bibasilar atelectasis.   Electronically Signed   By: Rosalie Gums M.D.   On: 05/09/2014 16:13   Ct Abdomen Pelvis W Contrast  05/09/2014   CLINICAL DATA:  Worsening sharp abdominal pain. Cholecystectomy on 03/28/2014.  EXAM: CT ABDOMEN AND PELVIS WITH CONTRAST  TECHNIQUE: Multidetector CT imaging of the abdomen and pelvis was performed using the standard protocol following bolus administration of intravenous contrast.  CONTRAST:  OMNIPAQUE IOHEXOL 300 MG/ML  SOLN  COMPARISON:  Abdominal ultrasound earlier today  FINDINGS: Large mixed density complex fluid collection in the gallbladder fossa measuring 7.2 x 5.5 x 5.5 cm. Differential includes abscess, hematoma or biloma. There is evidence of a rim of edema in the adjacent liver surrounding the collection as well as a more focal cystic area in  the adjacent liver measuring approximately 11 mm in diameter. These findings in the liver are suggestive of inflammation and potentially suggests that the gallbladder fossa collection may be infected. Correlation with a nuclear medicine hepatobiliary study may be helpful to exclude bile leak. No air is identified in the collection. No additional fluid collection or free fluid is identified elsewhere in the abdomen.  No evidence of biliary ductal dilatation. Bowel shows no evidence of obstruction. No free intraperitoneal air. Sigmoid colonic diverticulosis present. No enlarged lymph nodes. The pancreas, spleen, adrenal glands and kidneys are unremarkable.  There is a left inguinal hernia containing a short segment of the: At the juncture of the lower descending colon and proximal sigmoid colon. No evidence to suggest incarcerated hernia. The bladder is unremarkable. No bony abnormalities.  IMPRESSION: Complex fluid collection in the gallbladder fossa measuring just over 7 cm in greatest diameter. Differential includes abscess, hematoma or biloma. There is a rim of edema in the adjacent liver as well as a more focal cystic area in the adjacent liver. These findings suggest adjacent inflammation which favors abscess. No air is identified in the collection. Nuclear medicine study may be helpful to exclude bile leak.   Electronically Signed   By: Irish Lack M.D.   On: 05/09/2014 17:33   US Abdomen Limited Ruq  05/09/2014   CLINICAL DATA:  Right upper quadrant pain for 1 week, cholecystectomy 5 weeks ago  EXAM: US ABDOMEN LIMITED - RIGHT UPPER QUADRANT  COMPARISON:  02/19/2014  FINDINGS: Gallbladder:  Surgically absent  Common bile duct:  Diameter: 3 mm in diameter within normal limits  Liver:  There is diffuse increased echogenicity of the liver consistent with fatty infiltration. There is a complex avascular collection in gallbladder fossa region measures 8 by 6 cm. This may represent a hematoma, complex biloma  or early abscess. Clinical correlation is necessary. Further evaluation with enhanced CT scan is recommended as clinically warranted.  IMPRESSION: Status postcholecystectomy. Normal CBD. There is a complex avascular collection in gallbladder fossa region measures 8 by 6 cm. This may  represent a hematoma, complex biloma or early abscess. Clinical correlation is necessary. Further evaluation with enhanced CT scan is recommended as clinically warranted.   Electronically Signed   By: Natasha Mead M.D.   On: 05/09/2014 15:50     EKG Interpretation None      MDM   Final diagnoses:  RUQ pain    73 year old male with right upper quadrant pain. Status post cholecystectomy approximately 5 weeks ago. He was recovering well postop up until about a week ago. Having increasing right upper quadrant pain which has progressively worsened. CT shows a large fluid collection in gallbladder fossa. Less likely myeloma with normal bilirubin level. Possibly infectious particularly given small amount of fluid tracking around the liver. Afebrile. He does have mild leukocytosis. Also consider hematoma or seroma. Discussed with general surgery. Patient is on Plavix. There is no emergent need for procedure and anything invasive would be deferred at this time with plavix usage. We'll place on antibiotics. Patient offered admission versus discharge with continued antibiotics and pain medicine. Patient is comfortable with discharge. Is to follow-up with Dr. Lovell Sheehan in this office on Tuesday at 9:30. Instructed to hold plavix until then.  Return precautions before then were discussed.   I personally preformed the services scribed in my presence. The recorded information has been reviewed is accurate. Raeford Razor, MD.    Raeford Razor, MD 05/15/14 [phone]

## 2014-05-09 NOTE — ED Notes (Signed)
CT called, patient finished contrast

## 2014-05-13 ENCOUNTER — Inpatient Hospital Stay (HOSPITAL_COMMUNITY)
Admission: AD | Admit: 2014-05-13 | Discharge: 2014-05-16 | DRG: 919 | Disposition: A | Discharge status: Home / Self Care | Payer: Medicare HMO | Source: Ambulatory Visit | Attending: General Surgery | Admitting: General Surgery

## 2014-05-13 ENCOUNTER — Encounter (HOSPITAL_COMMUNITY): Payer: Self-pay | Admitting: *Deleted

## 2014-05-13 ENCOUNTER — Inpatient Hospital Stay (HOSPITAL_COMMUNITY): Payer: Medicare HMO

## 2014-05-13 DIAGNOSIS — Z7982 Long term (current) use of aspirin: Secondary | ICD-10-CM

## 2014-05-13 DIAGNOSIS — Y836 Removal of other organ (partial) (total) as the cause of abnormal reaction of the patient, or of later complication, without mention of misadventure at the time of the procedure: Secondary | ICD-10-CM | POA: Diagnosis present

## 2014-05-13 DIAGNOSIS — K651 Peritoneal abscess: Secondary | ICD-10-CM | POA: Diagnosis present

## 2014-05-13 DIAGNOSIS — Z9049 Acquired absence of other specified parts of digestive tract: Secondary | ICD-10-CM | POA: Diagnosis present

## 2014-05-13 DIAGNOSIS — M1990 Unspecified osteoarthritis, unspecified site: Secondary | ICD-10-CM | POA: Diagnosis present

## 2014-05-13 DIAGNOSIS — Z86010 Personal history of colonic polyps: Secondary | ICD-10-CM | POA: Diagnosis not present

## 2014-05-13 DIAGNOSIS — K219 Gastro-esophageal reflux disease without esophagitis: Secondary | ICD-10-CM | POA: Diagnosis present

## 2014-05-13 DIAGNOSIS — Z79899 Other long term (current) drug therapy: Secondary | ICD-10-CM | POA: Diagnosis not present

## 2014-05-13 DIAGNOSIS — T814XXA Infection following a procedure, initial encounter: Secondary | ICD-10-CM

## 2014-05-13 DIAGNOSIS — I10 Essential (primary) hypertension: Secondary | ICD-10-CM | POA: Diagnosis present

## 2014-05-13 DIAGNOSIS — Z955 Presence of coronary angioplasty implant and graft: Secondary | ICD-10-CM | POA: Diagnosis not present

## 2014-05-13 DIAGNOSIS — L7682 Other postprocedural complications of skin and subcutaneous tissue: Principal | ICD-10-CM | POA: Diagnosis present

## 2014-05-13 DIAGNOSIS — Z8673 Personal history of transient ischemic attack (TIA), and cerebral infarction without residual deficits: Secondary | ICD-10-CM

## 2014-05-13 DIAGNOSIS — L02211 Cutaneous abscess of abdominal wall: Secondary | ICD-10-CM | POA: Diagnosis present

## 2014-05-13 DIAGNOSIS — I252 Old myocardial infarction: Secondary | ICD-10-CM

## 2014-05-13 DIAGNOSIS — T8143XA Infection following a procedure, organ and space surgical site, initial encounter: Secondary | ICD-10-CM | POA: Diagnosis present

## 2014-05-13 DIAGNOSIS — R112 Nausea with vomiting, unspecified: Secondary | ICD-10-CM | POA: Diagnosis present

## 2014-05-13 DIAGNOSIS — Z7902 Long term (current) use of antithrombotics/antiplatelets: Secondary | ICD-10-CM | POA: Diagnosis not present

## 2014-05-13 DIAGNOSIS — E785 Hyperlipidemia, unspecified: Secondary | ICD-10-CM | POA: Diagnosis present

## 2014-05-13 DIAGNOSIS — T8149XA Infection following a procedure, other surgical site, initial encounter: Secondary | ICD-10-CM

## 2014-05-13 DIAGNOSIS — R188 Other ascites: Secondary | ICD-10-CM | POA: Insufficient documentation

## 2014-05-13 DIAGNOSIS — I2510 Atherosclerotic heart disease of native coronary artery without angina pectoris: Secondary | ICD-10-CM | POA: Diagnosis present

## 2014-05-13 DIAGNOSIS — IMO0001 Reserved for inherently not codable concepts without codable children: Secondary | ICD-10-CM

## 2014-05-13 LAB — BASIC METABOLIC PANEL
Anion gap: 12 (ref 5–15)
BUN: 15 mg/dL (ref 6–23)
CALCIUM: 9.5 mg/dL (ref 8.4–10.5)
CO2: 25 mEq/L (ref 19–32)
Chloride: 95 mEq/L — ABNORMAL LOW (ref 96–112)
Creatinine, Ser: 1.14 mg/dL (ref 0.50–1.35)
GFR calc Af Amer: 72 mL/min — ABNORMAL LOW (ref >90)
GFR, EST NON AFRICAN AMERICAN: 62 mL/min — AB (ref >90)
Glucose, Bld: 126 mg/dL — ABNORMAL HIGH (ref 70–99)
Potassium: 5 mEq/L (ref 3.7–5.3)
Sodium: 132 mEq/L — ABNORMAL LOW (ref 137–147)

## 2014-05-13 LAB — CBC WITH DIFFERENTIAL/PLATELET
Basophils Absolute: 0 10*3/uL (ref 0.0–0.1)
Basophils Relative: 0 % (ref 0–1)
EOS PCT: 1 % (ref 0–5)
Eosinophils Absolute: 0.1 10*3/uL (ref 0.0–0.7)
HEMATOCRIT: 35 % — AB (ref 39.0–52.0)
Hemoglobin: 11.5 g/dL — ABNORMAL LOW (ref 13.0–17.0)
LYMPHS PCT: 21 % (ref 12–46)
Lymphs Abs: 2.2 10*3/uL (ref 0.7–4.0)
MCH: 29.6 pg (ref 26.0–34.0)
MCHC: 32.9 g/dL (ref 30.0–36.0)
MCV: 90 fL (ref 78.0–100.0)
Monocytes Absolute: 1.4 10*3/uL — ABNORMAL HIGH (ref 0.1–1.0)
Monocytes Relative: 13 % — ABNORMAL HIGH (ref 3–12)
Neutro Abs: 7.1 10*3/uL (ref 1.7–7.7)
Neutrophils Relative %: 65 % (ref 43–77)
PLATELETS: 434 10*3/uL — AB (ref 150–400)
RBC: 3.89 MIL/uL — ABNORMAL LOW (ref 4.22–5.81)
RDW: 13.2 % (ref 11.5–15.5)
WBC: 10.8 10*3/uL — AB (ref 4.0–10.5)

## 2014-05-13 LAB — MAGNESIUM: Magnesium: 2.2 mg/dL (ref 1.5–2.5)

## 2014-05-13 LAB — HEPATIC FUNCTION PANEL
ALK PHOS: 238 U/L — AB (ref 39–117)
ALT: 55 U/L — AB (ref 0–53)
AST: 50 U/L — ABNORMAL HIGH (ref 0–37)
Albumin: 2.7 g/dL — ABNORMAL LOW (ref 3.5–5.2)
BILIRUBIN INDIRECT: 0.3 mg/dL (ref 0.3–0.9)
Bilirubin, Direct: 0.3 mg/dL (ref 0.0–0.3)
TOTAL PROTEIN: 7.5 g/dL (ref 6.0–8.3)
Total Bilirubin: 0.6 mg/dL (ref 0.3–1.2)

## 2014-05-13 LAB — PHOSPHORUS: PHOSPHORUS: 3 mg/dL (ref 2.3–4.6)

## 2014-05-13 LAB — PROTIME-INR
INR: 1.41 (ref 0.00–1.49)
Prothrombin Time: 17.4 seconds — ABNORMAL HIGH (ref 11.6–15.2)

## 2014-05-13 MED ORDER — STERILE WATER FOR INJECTION IJ SOLN
INTRAMUSCULAR | Status: AC
Start: 2014-05-13 — End: 2014-05-14
  Filled 2014-05-13: qty 10

## 2014-05-13 MED ORDER — DIPHENHYDRAMINE HCL 12.5 MG/5ML PO ELIX: 12.5000 mg | ORAL_SOLUTION | Freq: Four times a day (QID) | ORAL | Status: DC | PRN

## 2014-05-13 MED ORDER — RAMIPRIL 5 MG PO CAPS
10.0000 mg | ORAL_CAPSULE | Freq: Every day | ORAL | Status: DC
  Administered 2014-05-14 – 2014-05-16 (×3): 10 mg via ORAL
  Filled 2014-05-13 (×3): qty 2

## 2014-05-13 MED ORDER — OXYCODONE-ACETAMINOPHEN 5-325 MG PO TABS
1.0000 | ORAL_TABLET | ORAL | Status: DC | PRN
  Administered 2014-05-14: 1 via ORAL
  Filled 2014-05-13: qty 2

## 2014-05-13 MED ORDER — DEXTROSE IN LACTATED RINGERS 5 % IV SOLN
INTRAVENOUS | Status: DC
  Administered 2014-05-13 – 2014-05-14 (×2): via INTRAVENOUS

## 2014-05-13 MED ORDER — TECHNETIUM TC 99M MEBROFENIN IV KIT
5.0000 | PACK | Freq: Once | INTRAVENOUS | Status: AC | PRN
  Administered 2014-05-13: 5 via INTRAVENOUS

## 2014-05-13 MED ORDER — SODIUM CHLORIDE 0.9 % IJ SOLN
INTRAMUSCULAR | Status: AC
  Filled 2014-05-13: qty 12

## 2014-05-13 MED ORDER — PIPERACILLIN-TAZOBACTAM 3.375 G IVPB
3.3750 g | Freq: Three times a day (TID) | INTRAVENOUS | Status: DC
  Administered 2014-05-13 – 2014-05-16 (×9): 3.375 g via INTRAVENOUS
  Filled 2014-05-13 (×19): qty 50

## 2014-05-13 MED ORDER — NITROGLYCERIN 0.4 MG SL SUBL: 0.4000 mg | SUBLINGUAL_TABLET | SUBLINGUAL | Status: DC | PRN

## 2014-05-13 MED ORDER — METOPROLOL SUCCINATE ER 25 MG PO TB24
25.0000 mg | ORAL_TABLET | Freq: Every day | ORAL | Status: DC
  Administered 2014-05-14 – 2014-05-16 (×3): 25 mg via ORAL
  Filled 2014-05-13 (×3): qty 1

## 2014-05-13 MED ORDER — DIPHENHYDRAMINE HCL 50 MG/ML IJ SOLN: 12.5000 mg | Freq: Four times a day (QID) | INTRAMUSCULAR | Status: DC | PRN

## 2014-05-13 MED ORDER — ONDANSETRON HCL 4 MG/2ML IJ SOLN: 4.0000 mg | Freq: Four times a day (QID) | INTRAMUSCULAR | Status: DC | PRN

## 2014-05-13 MED ORDER — PRAVASTATIN SODIUM 10 MG PO TABS
20.0000 mg | ORAL_TABLET | Freq: Every evening | ORAL | Status: DC
  Administered 2014-05-13 – 2014-05-15 (×3): 20 mg via ORAL
  Filled 2014-05-13 (×3): qty 2

## 2014-05-13 MED ORDER — HYDROMORPHONE HCL 1 MG/ML IJ SOLN
1.0000 mg | INTRAMUSCULAR | Status: DC | PRN
  Administered 2014-05-14: 1 mg via INTRAVENOUS
  Filled 2014-05-13: qty 1

## 2014-05-13 NOTE — H&P (Signed)
Kevin Reeves is an 73 y.o. male.   Chief Complaint: intra-abdominal fluid collection HPI: patient is a 73 year old white male status post a laparoscopic cholecystectomy on 03/28/2014 who presented with right upper quadrant abdominal pain, nausea, and vomiting. He was seen the emergency room 4 days ago and was found on CT scan the abdomen to have a large fluid collection in the subhepatic space. There was some enhancement of this fluid collection. His white blood cell count was slightly elevated. Due to his history of taking Plavix, he was told to stop his Plavix and started on oral antibiotics. He presented to my office today due to worsening nausea and vomiting. He is directly admitted from my office for further workup.  Past Medical History  Diagnosis Date  . Arteriosclerotic cardiovascular disease (ASCVD)     Stent to RI in 2004. Posterior MI in 4/07 with thrombosis of RI at stent site while off Plavix --> reintervention; do not d/c plavix  . Cerebrovascular disease     Left carotid bruit with 40-59% ICA stenosis; right carotid endarectomy in 2008  . Hypertension   . Colonic polyp 1999    Adenoma resected in 1999; history of diverticulosis  . Gastroesophageal reflux disease     Dilation of esophagel stricture in 2008  . Hyperlipidemia   . Cholelithiasis 07/21/2009    Asymptomatic  . 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 Arthritis     Past Surgical History  Procedure Laterality Date  . Carotid endarterectomy  2008    Right  . Colonoscopy  2008    adenoma resected in 1999; no intervention in 2008  . Cardiac catheterization      cardiac stent placed  . Cholecystectomy N/A 03/28/2014    Procedure: LAPAROSCOPIC CHOLECYSTECTOMY;  Surgeon: Dalia Heading, MD;  Location: [facility];  Service: General;  Laterality: N/A;    Family History  Problem Relation Age of Onset  . Heart disease Father     Before age 28  . Hyperlipidemia Father   . Hypertension Father   . Stroke Other     Grandparent   . Heart attack Other     Grandparent  . Heart block      Brother  . Heart disease Paternal Grandmother   . Heart disease Paternal Grandfather   . Heart disease Brother   . Heart attack Brother   . Heart disease Brother     Before age 16-  Quad-BPG  . Hyperlipidemia Brother   . Hypertension Brother   . Heart attack Brother    Social History:  reports that he has never smoked. He has never used smokeless tobacco. He reports that he does not drink alcohol or use illicit drugs.  Allergies: No Known Allergies  Medications Prior to Admission  Medication Sig Dispense Refill  . aspirin EC 81 MG tablet Take 81 mg by mouth every evening.     . ciprofloxacin (CIPRO) 500 MG tablet Take 1 tablet (500 mg total) by mouth every 12 (twelve) hours. 14 tablet 0  . clopidogrel (PLAVIX) 75 MG tablet Take 75 mg by mouth daily.      Marland Kitchen HYDROcodone-acetaminophen (NORCO/VICODIN) 5-325 MG per tablet Take 1 tablet by mouth every 4 (four) hours as needed for moderate pain. (Patient not taking: Reported on 05/09/2014) 50 tablet 0  . metoprolol succinate (TOPROL-XL) 25 MG 24 hr tablet Take 25 mg by mouth daily.      . metroNIDAZOLE (FLAGYL) 500 MG tablet Take 1 tablet (500 mg total) by  mouth 3 (three) times daily. 21 tablet 0  . Multiple Vitamin (MULTIVITAMIN) tablet Take 1 tablet by mouth daily.      . nitroGLYCERIN (NITROSTAT) 0.4 MG SL tablet Place 0.4 mg under the tongue every 5 (five) minutes as needed for chest pain.    Marland Kitchen NITROSTAT 0.4 MG SL tablet DISSOLVE 1 TABLET UNDER  TONGUE EVERY 5 MINUTES UPTO 15 MIN FOR CHEST PAIN.IF NO RELIEF CALL 911. (Patient not taking: Reported on 05/09/2014) 25 each 6  . omeprazole (PRILOSEC) 20 MG capsule Take 20 mg by mouth daily.    Marland Kitchen oxyCODONE-acetaminophen (PERCOCET/ROXICET) 5-325 MG per tablet Take 1-2 tablets by mouth every 4 (four) hours as needed for severe pain. 25 tablet 0  . pravastatin (PRAVACHOL) 20 MG tablet Take 1 tablet (20 mg total) by mouth every evening. 90  tablet 0  . ramipril (ALTACE) 10 MG capsule Take 10 mg by mouth daily.        No results found for this or any previous visit (from the past 48 hour(s)). No results found.  Review of Systems  Constitutional: Positive for malaise/fatigue.  HENT: Negative.   Eyes: Negative.   Respiratory: Negative.   Cardiovascular: Negative.   Gastrointestinal: Positive for nausea, vomiting and abdominal pain.  Genitourinary: Negative.   Musculoskeletal: Negative.   Skin: Negative.   Endo/Heme/Allergies: Bruises/bleeds easily.    Blood pressure 89/58, pulse 69, temperature 98.7 F (37.1 C), temperature source Oral, resp. rate 18, height 5\' 8"  (1.727 m), weight 84.5 kg (186 lb 4.6 oz), SpO2 98 %. Physical Exam  Constitutional: He is oriented to person, place, and time. He appears well-developed and well-nourished.  HENT:  Head: Normocephalic and atraumatic.  Eyes: No scleral icterus.  Neck: Normal range of motion. Neck supple.  Cardiovascular: Normal rate, regular rhythm and normal heart sounds.   Respiratory: Effort normal and breath sounds normal.  GI: Soft. Bowel sounds are normal. He exhibits no distension. There is tenderness. There is no rebound.  Tender in the right upper quadrant to deep palpation. No rigidity noted.  Musculoskeletal: Normal range of motion.  Neurological: He is alert and oriented to person, place, and time.  Skin: Skin is warm and dry.     Assessment/Plan Impression: Intra-abdominal fluid collection, question abscess, question biloma, question hematoma Plan: Patient be admitted to the hospital and started on Zosyn. He will undergo hiatus scan today to rule out bile leak. He subsequently will undergo interventional radiologic percutaneous drainage of the fluid collection tomorrow. This has been explained to the patient family, who understand and agree to the treatment plan.  Windsor Goeken A 05/13/2014, 12:41 PM

## 2014-05-13 NOTE — Plan of Care (Signed)
Problem: Consults Goal: General Medical Patient Education See Patient Education Module for specific education. Outcome: Completed/Met Date Met:  05/13/14     

## 2014-05-13 NOTE — Progress Notes (Signed)
IR received request for image guided drain placement for patient with recent lap chole and abdominal fluid collection. Dr. Lowella Dandy has reviewed imaging and approved percutaneous drain for 11/18. RN states patient has been off plavix 5 days. RN to arrange carelink to have patient brought to [facility] radiology by 0800 tomorrow 11/18, npo after midnight, await labs today INR.  Pattricia Boss PA-C Interventional Radiology  11.17.15  2:44 PM

## 2014-05-13 NOTE — Care Management Utilization Note (Signed)
UR review complete.  

## 2014-05-14 ENCOUNTER — Ambulatory Visit (HOSPITAL_COMMUNITY)
Admit: 2014-05-14 | Discharge: 2014-05-14 | Disposition: A | Discharge status: Home / Self Care | Payer: Medicare HMO | Attending: General Surgery | Admitting: General Surgery

## 2014-05-14 DIAGNOSIS — R188 Other ascites: Secondary | ICD-10-CM | POA: Insufficient documentation

## 2014-05-14 MED ORDER — LIDOCAINE HCL (PF) 1 % IJ SOLN
INTRAMUSCULAR | Status: AC
  Filled 2014-05-14: qty 30

## 2014-05-14 MED ORDER — SODIUM CHLORIDE 0.9 % IV BOLUS (SEPSIS)
500.0000 mL | Freq: Once | INTRAVENOUS | Status: AC
  Administered 2014-05-14: 500 mL via INTRAVENOUS

## 2014-05-14 MED ORDER — OXYCODONE-ACETAMINOPHEN 5-325 MG PO TABS
1.0000 | ORAL_TABLET | ORAL | Status: DC | PRN
  Administered 2014-05-14: 2 via ORAL
  Administered 2014-05-14: 1 via ORAL
  Administered 2014-05-15: 2 via ORAL
  Administered 2014-05-15: 1 via ORAL
  Administered 2014-05-15 (×2): 2 via ORAL
  Administered 2014-05-16: 1 via ORAL
  Filled 2014-05-14 (×2): qty 2
  Filled 2014-05-14: qty 1
  Filled 2014-05-14: qty 2
  Filled 2014-05-14 (×2): qty 1
  Filled 2014-05-14: qty 2

## 2014-05-14 MED ORDER — FENTANYL CITRATE 0.05 MG/ML IJ SOLN
INTRAMUSCULAR | Status: AC | PRN
  Administered 2014-05-14 (×2): 50 ug via INTRAVENOUS

## 2014-05-14 MED ORDER — MIDAZOLAM HCL 2 MG/2ML IJ SOLN
INTRAMUSCULAR | Status: AC
  Filled 2014-05-14: qty 4

## 2014-05-14 MED ORDER — MIDAZOLAM HCL 2 MG/2ML IJ SOLN
INTRAMUSCULAR | Status: AC | PRN
  Administered 2014-05-14 (×2): 1 mg via INTRAVENOUS

## 2014-05-14 MED ORDER — FENTANYL CITRATE 0.05 MG/ML IJ SOLN
INTRAMUSCULAR | Status: AC
  Filled 2014-05-14: qty 4

## 2014-05-14 NOTE — Plan of Care (Signed)
Pt being transferred to [facility] (IR) for procedure.  Will return after complete and carelink is transporting.  Pt BP meds given and has been NPO since midnight.  Pt vitals stable when leaving [facility].

## 2014-05-14 NOTE — Sedation Documentation (Signed)
Pt opening eyes to conversation in room.  No complaints of pain.

## 2014-05-14 NOTE — Consult Note (Signed)
Reason for consult: abdominal fluid collection drainage  Referring Physician(s): Dr. Zenon Mayo  History of Present Illness: Kevin Reeves is a 72 y.o. male , s/p lap cholecystectomy 03/28/14, with recent hx abd pain,N/V, leukocytosis and CT revealing complex fluid collection in GB fossa concerning for abscess. HIDA scan was neg for bile leak. Request now received for CT guided drainage of this GB fossa fluid collection.   Past Medical History  Diagnosis Date  . Arteriosclerotic cardiovascular disease (ASCVD)     Stent to RI in 2004. Posterior MI in 4/07 with thrombosis of RI at stent site while off Plavix --> reintervention; do not d/c plavix  . Cerebrovascular disease     Left carotid bruit with 40-59% ICA stenosis; right carotid endarectomy in 2008  . Hypertension   . Colonic polyp 1999    Adenoma resected in 1999; history of diverticulosis  . Gastroesophageal reflux disease     Dilation of esophagel stricture in 2008  . Hyperlipidemia   . Cholelithiasis 07/21/2009    Asymptomatic  . 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 Arthritis     Past Surgical History  Procedure Laterality Date  . Carotid endarterectomy  2008    Right  . Colonoscopy  2008    adenoma resected in 1999; no intervention in 2008  . Cardiac catheterization      cardiac stent placed  . Cholecystectomy N/A 03/28/2014    Procedure: LAPAROSCOPIC CHOLECYSTECTOMY;  Surgeon: Dalia Heading, MD;  Location: [facility];  Service: General;  Laterality: N/A;    Allergies: Review of patient's allergies indicates no known allergies.  Medications: Prior to Admission medications   Medication Sig Start Date End Date Taking? Authorizing Provider  aspirin EC 81 MG tablet Take 81 mg by mouth every evening.    Yes Historical Provider, MD  ciprofloxacin (CIPRO) 500 MG tablet Take 1 tablet (500 mg total) by mouth every 12 (twelve) hours. 05/09/14  Yes Raeford Razor, MD  clopidogrel (PLAVIX) 75 MG tablet Take 75 mg by mouth  daily.     Yes Historical Provider, MD  metoprolol succinate (TOPROL-XL) 25 MG 24 hr tablet Take 25 mg by mouth daily.     Yes Historical Provider, MD  metroNIDAZOLE (FLAGYL) 500 MG tablet Take 1 tablet (500 mg total) by mouth 3 (three) times daily. 05/09/14  Yes Raeford Razor, MD  Multiple Vitamin (MULTIVITAMIN) tablet Take 1 tablet by mouth daily.     Yes Historical Provider, MD  NITROSTAT 0.4 MG SL tablet DISSOLVE 1 TABLET UNDER  TONGUE EVERY 5 MINUTES UPTO 15 MIN FOR CHEST PAIN.IF NO RELIEF CALL 911. 02/22/11  Yes Kathlen Brunswick, MD  omeprazole (PRILOSEC) 20 MG capsule Take 20 mg by mouth daily.   Yes Historical Provider, MD  oxyCODONE-acetaminophen (PERCOCET/ROXICET) 5-325 MG per tablet Take 1-2 tablets by mouth every 4 (four) hours as needed for severe pain. 05/09/14  Yes Raeford Razor, MD  pravastatin (PRAVACHOL) 20 MG tablet Take 1 tablet (20 mg total) by mouth every evening. 03/24/14  Yes Antoine Poche, MD  ramipril (ALTACE) 10 MG capsule Take 10 mg by mouth daily.     Yes Historical Provider, MD    Family History  Problem Relation Age of Onset  . Heart disease Father     Before age 18  . Hyperlipidemia Father   . Hypertension Father   . Stroke Other     Grandparent  . Heart attack Other     Grandparent  . Heart  block      Brother  . Heart disease Paternal Grandmother   . Heart disease Paternal Grandfather   . Heart disease Brother   . Heart attack Brother   . Heart disease Brother     Before age 33-  Quad-BPG  . Hyperlipidemia Brother   . Hypertension Brother   . Heart attack Brother     History   Social History  . Marital Status: Married    Spouse Name: N/A    Number of Children: N/A  . Years of Education: N/A   Occupational History  . [employer]     Part time for 50 years-working 3 days per week   Social History Main Topics  . Smoking status: Never Smoker   . Smokeless tobacco: Never Used  . Alcohol Use: No  . Drug Use: No  . Sexual  Activity: Yes    Birth Control/ Protection: None   Other Topics Concern  . None   Social History Narrative   Married and resides in [place]         Review of Systems  Constitutional: Negative for fever and chills.  Respiratory: Negative for cough and shortness of breath.   Cardiovascular: Negative for chest pain.  Gastrointestinal: Positive for nausea, vomiting and abdominal pain. Negative for blood in stool.  Genitourinary: Negative for dysuria and hematuria.  Musculoskeletal: Negative for back pain.  Neurological: Negative for headaches.  Hematological: Does not bruise/bleed easily.    Vital Signs: BP 110/64 mmHg  Pulse 84  Temp(Src) 98.1 F (36.7 C) (Oral)  Resp 18  Ht 5\' 8"  (1.727 m)  Wt 186 lb 4.6 oz (84.5 kg)  BMI 28.33 kg/m2  SpO2 98%  Physical Exam  Constitutional: He is oriented to person, place, and time. He appears well-developed and well-nourished.  Cardiovascular: Normal rate and regular rhythm.   Pulmonary/Chest: Effort normal and breath sounds normal.  Abdominal: Soft. Bowel sounds are normal.  Musculoskeletal: Normal range of motion.  Neurological: He is alert and oriented to person, place, and time.    Imaging: Dg Chest 2 View  05/09/2014   CLINICAL DATA:  Right upper quadrant pain for 1-2 weeks. Shortness of breath. Symptoms are getting worse. Gallbladder if the 5 weeks ago.  EXAM: CHEST  2 VIEW  COMPARISON:  04/13/2006  FINDINGS: Shallow lung inflation. Mild elevation of the right hemidiaphragm. Heart size is normal. There is bibasilar atelectasis, right greater than left. No focal consolidations. No pleural effusions. No pulmonary edema. Degenerative changes are seen in the mid thoracic spine.  IMPRESSION: Bibasilar atelectasis.   Electronically Signed   By: 04/15/2006 M.D.   On: 05/09/2014 16:13   Nm Hepatobiliary Liver Func  05/13/2014   CLINICAL DATA:  Post laparoscopic cholecystectomy 03/28/2014, RIGHT upper quadrant pain getting 3 weeks  postoperative, increased since, fluid collection at gallbladder fossa on CT  EXAM: NUCLEAR MEDICINE HEPATOBILIARY IMAGING  TECHNIQUE: Sequential images of the abdomen were obtained out to 60 minutes following intravenous administration of radiopharmaceutical.  RADIOPHARMACEUTICALS:  5 mCi Tc-62m Choletec IV  COMPARISON:  CT abdomen and pelvis 05/09/2014  FINDINGS: Normal tracer extraction from bloodstream indicating normal hepatocellular function.  Prompt excretion of tracer into biliary tree.  Gallbladder surgically absent.  Small bowel visualized by 15 min.  Small amount of tracer refluxes into stomach.  No abnormal subhepatic or gallbladder fossa tracer accumulation is identified to suggest bile leak.  No focal retention of tracer within the liver to suggest biliary obstruction.  IMPRESSION: No evidence of bile leak or biliary obstruction.  Normal hepatobiliary imaging post cholecystectomy.   Electronically Signed   By: Ulyses Southward M.D.   On: 05/13/2014 16:19   Ct Abdomen Pelvis W Contrast  05/09/2014   CLINICAL DATA:  Worsening sharp abdominal pain. Cholecystectomy on 03/28/2014.  EXAM: CT ABDOMEN AND PELVIS WITH CONTRAST  TECHNIQUE: Multidetector CT imaging of the abdomen and pelvis was performed using the standard protocol following bolus administration of intravenous contrast.  CONTRAST:  OMNIPAQUE IOHEXOL 300 MG/ML  SOLN  COMPARISON:  Abdominal ultrasound earlier today  FINDINGS: Large mixed density complex fluid collection in the gallbladder fossa measuring 7.2 x 5.5 x 5.5 cm. Differential includes abscess, hematoma or biloma. There is evidence of a rim of edema in the adjacent liver surrounding the collection as well as a more focal cystic area in the adjacent liver measuring approximately 11 mm in diameter. These findings in the liver are suggestive of inflammation and potentially suggests that the gallbladder fossa collection may be infected. Correlation with a nuclear medicine hepatobiliary  study may be helpful to exclude bile leak. No air is identified in the collection. No additional fluid collection or free fluid is identified elsewhere in the abdomen.  No evidence of biliary ductal dilatation. Bowel shows no evidence of obstruction. No free intraperitoneal air. Sigmoid colonic diverticulosis present. No enlarged lymph nodes. The pancreas, spleen, adrenal glands and kidneys are unremarkable.  There is a left inguinal hernia containing a short segment of the: At the juncture of the lower descending colon and proximal sigmoid colon. No evidence to suggest incarcerated hernia. The bladder is unremarkable. No bony abnormalities.  IMPRESSION: Complex fluid collection in the gallbladder fossa measuring just over 7 cm in greatest diameter. Differential includes abscess, hematoma or biloma. There is a rim of edema in the adjacent liver as well as a more focal cystic area in the adjacent liver. These findings suggest adjacent inflammation which favors abscess. No air is identified in the collection. Nuclear medicine study may be helpful to exclude bile leak.   Electronically Signed   By: Irish Lack M.D.   On: 05/09/2014 17:33   US Abdomen Limited Ruq  05/09/2014   CLINICAL DATA:  Right upper quadrant pain for 1 week, cholecystectomy 5 weeks ago  EXAM: US ABDOMEN LIMITED - RIGHT UPPER QUADRANT  COMPARISON:  02/19/2014  FINDINGS: Gallbladder:  Surgically absent  Common bile duct:  Diameter: 3 mm in diameter within normal limits  Liver:  There is diffuse increased echogenicity of the liver consistent with fatty infiltration. There is a complex avascular collection in gallbladder fossa region measures 8 by 6 cm. This may represent a hematoma, complex biloma or early abscess. Clinical correlation is necessary. Further evaluation with enhanced CT scan is recommended as clinically warranted.  IMPRESSION: Status postcholecystectomy. Normal CBD. There is a complex avascular collection in gallbladder fossa  region measures 8 by 6 cm. This may represent a hematoma, complex biloma or early abscess. Clinical correlation is necessary. Further evaluation with enhanced CT scan is recommended as clinically warranted.   Electronically Signed   By: Natasha Mead M.D.   On: 05/09/2014 15:50    Labs:  CBC:  Recent Labs  03/24/14 0930 05/09/14 1427 05/13/14 1803  WBC 7.5 12.6* 10.8*  HGB 13.8 12.5* 11.5*  HCT 41.6 37.5* 35.0*  PLT 244 418* 434*    COAGS:  Recent Labs  05/13/14 1803  INR 1.41    BMP:  Recent Labs  12/03/13 0848 03/24/14 0930 05/09/14 1427 05/13/14 1803  NA 139 139 134* 132*  K 4.6 4.4 4.5 5.0  CL 106 104 95* 95*  CO2 24 23 24 25   GLUCOSE 86 99 136* 126*  BUN 20 18 21 15   CALCIUM 9.1 9.3 9.8 9.5  CREATININE 1.14 1.26 1.24 1.14  GFRNONAA  --  55* 56* 62*  GFRAA  --  64* 65* 72*    LIVER FUNCTION TESTS:  Recent Labs  12/03/13 0848 03/24/14 0930 05/09/14 1427 05/13/14 1803  BILITOT 0.4 0.4 0.9 0.6  AST 15 18 94* 50*  ALT 18 20 138* 55*  ALKPHOS 64 65 266* 238*  PROT 6.9 7.4 8.4* 7.5  ALBUMIN 3.9 3.7 3.2* 2.7*    TUMOR MARKERS: No results for input(s): AFPTM, CEA, CA199, CHROMGRNA in the last 8760 hours.  Assessment and Plan: Kevin Reeves is a 73 y.o. male , s/p lap cholecystectomy 03/28/14, with recent hx abd pain,N/V, leukocytosis and CT revealing complex fluid collection in GB fossa concerning for abscess. HIDA scan was neg for bile leak. Request now received for CT guided drainage of this GB fossa fluid collection. Imaging studies have been reviewed by Dr. Loreta Ave. Details/risks of procedure d/w pt with his understanding and consent. Procedure is scheduled for this am.           I spent a total of 20 minutes face to face in clinical consultation, greater than 50% of which was counseling/coordinating care for gallbladder fossa fluid collection drainage  Signed: Chinita Pester 05/14/2014, 8:57 AM

## 2014-05-14 NOTE — Progress Notes (Signed)
Patient back from percutaneous placement of drain into subhepatic space. Apparent hematoma found. Patient tolerated procedure well. We'll place back on regular diet. Continue IV Zosyn for now. Will check labs tomorrow in a.m.

## 2014-05-14 NOTE — Sedation Documentation (Signed)
50cc of dark red drainage removed from site by Dr. Loreta Ave to send for culture.

## 2014-05-14 NOTE — Sedation Documentation (Signed)
[facility] here to pick patient up to take back to [facility].  Report given to Oneal.  Pt stable.  JP drain intact.

## 2014-05-14 NOTE — Sedation Documentation (Addendum)
Pt given snack and apple juice. °

## 2014-05-14 NOTE — Sedation Documentation (Signed)
Received report from Janifer Adie, EMT with [facility]. AAOx3. VSS.  Pt NPO since midnight.  Called for PA.

## 2014-05-14 NOTE — Care Management Note (Addendum)
    Page 1 of 1   05/16/2014     9:26:38 AM CARE MANAGEMENT NOTE 05/16/2014  Patient:  Kevin Reeves, Kevin Reeves   Account Number:  [id number]  Date Initiated:  05/14/2014  Documentation initiated by:  Sharrie Rothman  Subjective/Objective Assessment:   Pt admitted from home with intra abd abcess. Pt lives with spouse and will return home at discharge.     Action/Plan:   Pt at [facility] for drain placement. Will continue to follow for discharge planning needs.   Anticipated DC Date:  05/19/2014   Anticipated DC Plan:  HOME/SELF CARE      DC Planning Services  CM consult      Choice offered to / List presented to:             Status of service:  Completed, signed off Medicare Important Message given?  YES (If response is "NO", the following Medicare IM given date fields will be blank) Date Medicare IM given:  05/16/2014 Medicare IM given by:  Sharrie Rothman Date Additional Medicare IM given:   Additional Medicare IM given by:    Discharge Disposition:  HOME/SELF CARE  Per UR Regulation:    If discussed at Long Length of Stay Meetings, dates discussed:    Comments:  05/16/14 0925 Arlyss Queen, RN BSN CM Pt discharged home today. No CM needs noted.  05/15/14 1335 Arlyss Queen, RN BSN CM CM spoke with pt and he stated that he does not need any HH at discharge. Staff has been educating pt on drain and drain care. Pt is aware that if he changes his mind, CM will be glad to arrange [facility] RN at discharge.  05/14/14 1515 Arlyss Queen, RN BSN CM

## 2014-05-14 NOTE — Sedation Documentation (Signed)
Dr. Loreta Ave back in to s/w pt.  Called [facility] for transfer.

## 2014-05-14 NOTE — Sedation Documentation (Signed)
Jeananne Rama, PA, in to do H&P.

## 2014-05-14 NOTE — Plan of Care (Signed)
Problem: Consults Goal: Skin Care Protocol Initiated - if Braden Score 18 or less If consults are not indicated, leave blank or document N/A  Outcome: Completed/Met Date Met:  05/14/14     

## 2014-05-14 NOTE — Sedation Documentation (Signed)
Pt readily responds to questions and opens eyes to name.

## 2014-05-14 NOTE — Procedures (Signed)
Interventional Radiology Procedure Note  Procedure: Ct guided drainage of sub-hepatic fluid collection, SP cholecystectomy.  Findings:  50cc of bloody fluid removed.  Cx sent Complications: No immediate Recommendations:  - Record I/O's - Do not submerge - Routine drain care   Signed,  Yvone Neu. Loreta Ave, DO

## 2014-05-15 LAB — CBC
HEMATOCRIT: 31.5 % — AB (ref 39.0–52.0)
Hemoglobin: 10.4 g/dL — ABNORMAL LOW (ref 13.0–17.0)
MCH: 29.5 pg (ref 26.0–34.0)
MCHC: 33 g/dL (ref 30.0–36.0)
MCV: 89.2 fL (ref 78.0–100.0)
Platelets: 500 10*3/uL — ABNORMAL HIGH (ref 150–400)
RBC: 3.53 MIL/uL — ABNORMAL LOW (ref 4.22–5.81)
RDW: 13.3 % (ref 11.5–15.5)
WBC: 10.5 10*3/uL (ref 4.0–10.5)

## 2014-05-15 LAB — BASIC METABOLIC PANEL
Anion gap: 13 (ref 5–15)
BUN: 12 mg/dL (ref 6–23)
CHLORIDE: 100 meq/L (ref 96–112)
CO2: 21 mEq/L (ref 19–32)
CREATININE: 1.2 mg/dL (ref 0.50–1.35)
Calcium: 8.5 mg/dL (ref 8.4–10.5)
GFR calc Af Amer: 67 mL/min — ABNORMAL LOW (ref >90)
GFR, EST NON AFRICAN AMERICAN: 58 mL/min — AB (ref >90)
Glucose, Bld: 144 mg/dL — ABNORMAL HIGH (ref 70–99)
Potassium: 4.3 mEq/L (ref 3.7–5.3)
Sodium: 134 mEq/L — ABNORMAL LOW (ref 137–147)

## 2014-05-15 LAB — HEPATIC FUNCTION PANEL
ALT: 39 U/L (ref 0–53)
AST: 30 U/L (ref 0–37)
Albumin: 2.2 g/dL — ABNORMAL LOW (ref 3.5–5.2)
Alkaline Phosphatase: 187 U/L — ABNORMAL HIGH (ref 39–117)
BILIRUBIN TOTAL: 0.4 mg/dL (ref 0.3–1.2)
Total Protein: 6.3 g/dL (ref 6.0–8.3)

## 2014-05-15 NOTE — Progress Notes (Addendum)
Subjective: Feeling much better. Tolerating soft diet well. Does not feel lightheaded.  Objective: Vital signs in last 24 hours: Temp:  [97.9 F (36.6 C)-100 F (37.8 C)] 100 F (37.8 C) (11/19 0430) Pulse Rate:  [61-100] 100 (11/19 0430) Resp:  [17-21] 20 (11/19 0430) BP: (87-116)/(54-72) 116/64 mmHg (11/19 0858) SpO2:  [95 %-99 %] 96 % (11/19 0430) Last BM Date: 05/13/14  Intake/Output from previous day: 11/18 0701 - 11/19 0700 In: 240 [P.O.:240] Out: 755 [Urine:650; Drains:100] Intake/Output this shift:    General appearance: alert, cooperative and no distress Resp: clear to auscultation bilaterally Cardio: regular rate and rhythm, S1, S2 normal, no murmur, click, rub or gallop GI: Soft. Some tenderness noted at drain site. Sanguinous old drainage noted.  Lab Results:   Recent Labs  05/13/14 1803 05/15/14 0634  WBC 10.8* 10.5  HGB 11.5* 10.4*  HCT 35.0* 31.5*  PLT 434* 500*   BMET  Recent Labs  05/13/14 1803 05/15/14 0634  NA 132* 134*  K 5.0 4.3  CL 95* 100  CO2 25 21  GLUCOSE 126* 144*  BUN 15 12  CREATININE 1.14 1.20  CALCIUM 9.5 8.5   PT/INR  Recent Labs  05/13/14 1803  LABPROT 17.4*  INR 1.41    Studies/Results: Nm Hepatobiliary Liver Func  05/13/2014   CLINICAL DATA:  Post laparoscopic cholecystectomy 03/28/2014, RIGHT upper quadrant pain getting 3 weeks postoperative, increased since, fluid collection at gallbladder fossa on CT  EXAM: NUCLEAR MEDICINE HEPATOBILIARY IMAGING  TECHNIQUE: Sequential images of the abdomen were obtained out to 60 minutes following intravenous administration of radiopharmaceutical.  RADIOPHARMACEUTICALS:  5 mCi Tc-81m Choletec IV  COMPARISON:  CT abdomen and pelvis 05/09/2014  FINDINGS: Normal tracer extraction from bloodstream indicating normal hepatocellular function.  Prompt excretion of tracer into biliary tree.  Gallbladder surgically absent.  Small bowel visualized by 15 min.  Small amount of tracer  refluxes into stomach.  No abnormal subhepatic or gallbladder fossa tracer accumulation is identified to suggest bile leak.  No focal retention of tracer within the liver to suggest biliary obstruction.  IMPRESSION: No evidence of bile leak or biliary obstruction.  Normal hepatobiliary imaging post cholecystectomy.   Electronically Signed   By: Ulyses Southward M.D.   On: 05/13/2014 16:19   Ct Image Guided Fluid Drain By Catheter  05/14/2014   CLINICAL DATA:  73 year old male status post cholecystectomy. He has developed interval fluid collection in the gallbladder fossa. He has been referred image guided drainage.  EXAM: CT GUIDED DRAINAGE BIOPSY OF GALLBLADDER FOSSA FLUID COLLECTION  ANESTHESIA/SEDATION: 2.0  Mg IV Versed; 100 mcg IV Fentanyl  Total Moderate Sedation Time: 18 minutes.  PROCEDURE: The procedure risks, benefits, and alternatives were explained to the patient. Questions regarding the procedure were encouraged and answered. The patient understands and consents to the procedure.  Scout CT of the abdomen was performed for surgical planning purposes.  The right upper quadrant, overlying the costochondral junction on the right was prepped with Betadinein a sterile fashion, and a sterile drape was applied covering the operative field. A sterile gown and sterile gloves were used for the procedure. Local anesthesia was provided with 1% Lidocaine.  Once the patient is prepped and draped and the region was thoroughly anesthetized with 1% lidocaine, a small stab incision was made with 11 blade scalpel. Using CT guidance, a Yueh needle was advanced into the subhepatic fluid collection in the gallbladder fossa. The CED needle was removed and a 0.035 Amplatz wire was advanced  through the [facility] needle into the collection. The UE catheter was removed and serial dilation to 12 [place] was performed. Subsequently a 12 French pigtail drain was placed into the fluid collection. Approximately 50 cc of bloody fluid was  removed. A sample was sent to the lab for culture.  The catheter pigtail was formed, the catheter is sutured in position, and the catheter was attached to bulb drainage.  Patient tolerated the procedure well and remained hemodynamically stable throughout.  No complications were encountered and no significant blood loss was encountered.  Final CT image confirmed position of the catheter in the fluid collection.  COMPLICATIONS: None  FINDINGS: Initial CT of the abdomen demonstrates fluid collection in the gallbladder fossa, similar to fluid on prior comparison study.  Images during the case demonstrate safe placement of pigtail catheter in the subhepatic fluid collection.  Final image demonstrates 12 French pigtail catheter within fluid collection with partial decompression. Residual fluid remains.  IMPRESSION: Status post image guided drainage of subhepatic fluid collection in the gallbladder fossa, in this patient status post cholecystectomy. Approximately 50 cc of bloody fluid was aspirated and sent for culture.  PLAN: Recommend following the output of the drain, with the target of less than 10-15 cc per day before removal.  Signed,  Yvone Neu. Loreta Ave, DO  [facility]  [facility] - Inpatient Radiology   Electronically Signed   By: Gilmer Mor D.O.   On: 05/14/2014 10:22    Anti-infectives: Anti-infectives    Start     Dose/Rate Route Frequency Ordered Stop   05/13/14 1400  piperacillin-tazobactam (ZOSYN) IVPB 3.375 g     3.375 g12.5 mL/hr over 240 Minutes Intravenous 3 times per day 05/13/14 1236        Assessment/Plan: Impression: Status post percutaneous drainage of fluid collection which appears to be old hematoma. Liver enzyme tests have remarkably improved. Initial culture negative. Plan: We'll monitor blood pressure. Continue IV antibiotics for now. Anticipate discharge in next 24-48 hrs.  LOS: 2 days    Javeon Macmurray A 05/15/2014

## 2014-05-16 LAB — CBC
HCT: 32.4 % — ABNORMAL LOW (ref 39.0–52.0)
Hemoglobin: 10.5 g/dL — ABNORMAL LOW (ref 13.0–17.0)
MCH: 29.3 pg (ref 26.0–34.0)
MCHC: 32.4 g/dL (ref 30.0–36.0)
MCV: 90.5 fL (ref 78.0–100.0)
PLATELETS: 529 10*3/uL — AB (ref 150–400)
RBC: 3.58 MIL/uL — ABNORMAL LOW (ref 4.22–5.81)
RDW: 13.3 % (ref 11.5–15.5)
WBC: 9.1 10*3/uL (ref 4.0–10.5)

## 2014-05-16 NOTE — Discharge Instructions (Signed)
Bulb Drain Home Care °A bulb drain consists of a thin rubber tube and a soft, round bulb that creates a gentle suction. The rubber tube is placed in the area where you had surgery. A bulb is attached to the end of the tube that is outside the body. The bulb drain removes excess fluid that normally builds up in a surgical wound after surgery. The color and amount of fluid will vary. Immediately after surgery, the fluid is bright red and is a little thicker than water. It may gradually change to a yellow or pink color and become more thin and water-like. When the amount decreases to about 1 or 2 tbsp in 24 hours, your health care provider will usually remove it. °DAILY CARE °· Keep the bulb flat (compressed) at all times, except while emptying it. The flatness creates suction. You can flatten the bulb by squeezing it firmly in the middle and then closing the cap. °· Keep sites where the tube enters the skin dry and covered with a bandage (dressing). °· Secure the tube 1-2 in (2.5-5.1 cm) below the insertion sites to keep it from pulling on your stitches. The tube is stitched in place and will not slip out. °· Secure the bulb as directed by your health care provider. °· For the first 3 days after surgery, there usually is more fluid in the bulb. Empty the bulb whenever it becomes half full because the bulb does not create enough suction if it is too full. The bulb could also overflow. Write down how much fluid you remove each time you empty your drain. Add up the amount removed in 24 hours. °· Empty the bulb at the same time every day once the amount of fluid decreases and you only need to empty it once a day. Write down the amounts and the 24-hour totals to give to your health care provider. This helps your health care provider know when the tubes can be removed. °EMPTYING THE BULB DRAIN °Before emptying the bulb, get a measuring cup, a piece of paper and a pen, and wash your hands. °· Gently run your fingers down the  tube (stripping) to empty any drainage from the tubing into the bulb. This may need to be done several times a day to clear the tubing of clots and tissue. °· Open the bulb cap to release suction, which causes it to inflate. Do not touch the inside of the cap. °· Gently run your fingers down the tube (stripping) to empty any drainage from the tubing into the bulb. °· Hold the cap out of the way, and pour fluid into the measuring cup.   °· Squeeze the bulb to provide suction.  °· Replace the cap.   °· Check the tape that holds the tube to your skin. If it is becoming loose, you can remove the loose piece of tape and apply a new one. Then, pin the bulb to your shirt.   °· Write down the amount of fluid you emptied out. Write down the date and each time you emptied your bulb drain. (If there are 2 bulbs, note the amount of drainage from each bulb and keep the totals separate. Your health care provider will want to know the total amounts for each drain and which tube is draining more.)   °· Flush the fluid down the toilet and wash your hands.   °· Call your health care provider once you have less than 2 tbsp of fluid collecting in the bulb drain every 24 hours. °If   there is drainage around the tube site, change dressings and keep the area dry. Cleanse around tube with sterile saline and place dry gauze around site. This gauze should be changed when it is soiled. If it stays clean and unsoiled, it should still be changed daily.  °SEEK MEDICAL CARE IF: °· Your drainage has a bad smell or is cloudy.   °· You have a fever.   °· Your drainage is increasing instead of decreasing.   °· Your tube fell out.   °· You have redness or swelling around the tube site.   °· You have drainage from a surgical wound.   °· Your bulb drain will not stay flat after you empty it.   °MAKE SURE YOU:  °· Understand these instructions. °· Will watch your condition. °· Will get help right away if you are not doing well or get worse. °Document  Released: 06/10/2000 Document Revised: 10/28/2013 Document Reviewed: 11/16/2011 °Exit[employer]® Patient Information ©2015 [employer]. This information is not intended to replace advice given to you by your health care provider. Make sure you discuss any questions you have with your health care provider. ° °

## 2014-05-16 NOTE — Discharge Summary (Signed)
Physician Discharge Summary  Patient ID: Kevin Reeves MRN: [id number] DOB/AGE: [birth year] 73 y.o.  Admit date: 05/13/2014 Discharge date: 05/16/2014  Admission Diagnoses: Intra-abdominal fluid collection  Discharge Diagnoses: Same, hematoma Active Problems:   Intra-abdominal abscess post-procedure   Abdominal fluid collection   Discharged Condition: good  Hospital Course: Patient is a 73 year old white male who is status post laparoscopic cholecystectomy 5 weeks ago who was admitted to the emergency room with worsening right upper quadrant abdominal pain. He was noted to have a fluid collection in the subhepatic space. He does take Plavix. He stopped his Plavix and then presented to my office on 05/13/2014. He was directly admitted to the hospital. He was started on IV antibiotics. Hiatus scan was performed which was negative for bile leak. He stopped leak underwent a percutaneous drainage of the subhepatic fluid collection on 05/14/2014. An old hematoma was found. Cultures subsequently have revealed a possible gram-positive cocci in pairs. His white blood cell count has not been significantly elevated. His JP drainage has been decreasing.  He is being discharged home with his JP drain in good improving condition. His liver enzyme tests have normalized.  Treatments: procedures: percutaneous drainage catheter placement  Discharge Exam: Blood pressure 122/68, pulse 63, temperature 98.3 F (36.8 C), temperature source Oral, resp. rate 20, height 5\' 8"  (1.727 m), weight 84.5 kg (186 lb 4.6 oz), SpO2 97 %. General appearance: alert, cooperative and no distress Resp: clear to auscultation bilaterally Cardio: regular rate and rhythm, S1, S2 normal, no murmur, click, rub or gallop GI: Soft, nontender, nondistended. JP drainage with a small amount of sanguinous drainage present.  Disposition: 01-Home or Self Care     Medication List    STOP taking these medications        clopidogrel  75 MG tablet  Commonly known as:  PLAVIX      TAKE these medications        aspirin EC 81 MG tablet  Take 81 mg by mouth every evening.     ciprofloxacin 500 MG tablet  Commonly known as:  CIPRO  Take 1 tablet (500 mg total) by mouth every 12 (twelve) hours.     metoprolol succinate 25 MG 24 hr tablet  Commonly known as:  TOPROL-XL  Take 25 mg by mouth daily.     metroNIDAZOLE 500 MG tablet  Commonly known as:  FLAGYL  Take 1 tablet (500 mg total) by mouth 3 (three) times daily.     multivitamin tablet  Take 1 tablet by mouth daily.     NITROSTAT 0.4 MG SL tablet  Generic drug:  nitroGLYCERIN  DISSOLVE 1 TABLET UNDER  TONGUE EVERY 5 MINUTES UPTO 15 MIN FOR CHEST PAIN.IF NO RELIEF CALL 911.     omeprazole 20 MG capsule  Commonly known as:  PRILOSEC  Take 20 mg by mouth daily.     oxyCODONE-acetaminophen 5-325 MG per tablet  Commonly known as:  PERCOCET/ROXICET  Take 1-2 tablets by mouth every 4 (four) hours as needed for severe pain.     pravastatin 20 MG tablet  Commonly known as:  PRAVACHOL  Take 1 tablet (20 mg total) by mouth every evening.     ramipril 10 MG capsule  Commonly known as:  ALTACE  Take 10 mg by mouth daily.           Follow-up Information    Follow up with , MD. Schedule an appointment as soon as possible for a visit on 05/20/2014.  Specialty:  General Surgery   Contact information:   [address] [place] [address] [phone]       Signed: Franky Macho A 05/16/2014, 9:15 AM

## 2014-05-17 LAB — CULTURE, ROUTINE-ABSCESS

## 2014-05-19 LAB — ANAEROBIC CULTURE

## 2014-06-24 ENCOUNTER — Encounter (HOSPITAL_COMMUNITY): Payer: Self-pay | Admitting: *Deleted

## 2014-06-24 ENCOUNTER — Emergency Department (HOSPITAL_COMMUNITY): Payer: Medicare HMO

## 2014-06-24 ENCOUNTER — Observation Stay (HOSPITAL_COMMUNITY)
Admission: EM | Admit: 2014-06-24 | Discharge: 2014-06-27 | Disposition: A | Discharge status: Home / Self Care | Payer: Medicare HMO | Attending: Internal Medicine | Admitting: Internal Medicine

## 2014-06-24 DIAGNOSIS — R109 Unspecified abdominal pain: Secondary | ICD-10-CM | POA: Diagnosis present

## 2014-06-24 DIAGNOSIS — M1990 Unspecified osteoarthritis, unspecified site: Secondary | ICD-10-CM | POA: Diagnosis not present

## 2014-06-24 DIAGNOSIS — E785 Hyperlipidemia, unspecified: Secondary | ICD-10-CM | POA: Diagnosis present

## 2014-06-24 DIAGNOSIS — R748 Abnormal levels of other serum enzymes: Principal | ICD-10-CM | POA: Insufficient documentation

## 2014-06-24 DIAGNOSIS — I2510 Atherosclerotic heart disease of native coronary artery without angina pectoris: Secondary | ICD-10-CM | POA: Diagnosis not present

## 2014-06-24 DIAGNOSIS — R945 Abnormal results of liver function studies: Secondary | ICD-10-CM

## 2014-06-24 DIAGNOSIS — R110 Nausea: Secondary | ICD-10-CM | POA: Diagnosis not present

## 2014-06-24 DIAGNOSIS — K219 Gastro-esophageal reflux disease without esophagitis: Secondary | ICD-10-CM | POA: Diagnosis not present

## 2014-06-24 DIAGNOSIS — R8299 Other abnormal findings in urine: Secondary | ICD-10-CM | POA: Insufficient documentation

## 2014-06-24 DIAGNOSIS — R7989 Other specified abnormal findings of blood chemistry: Secondary | ICD-10-CM

## 2014-06-24 DIAGNOSIS — Z792 Long term (current) use of antibiotics: Secondary | ICD-10-CM | POA: Insufficient documentation

## 2014-06-24 DIAGNOSIS — Z9889 Other specified postprocedural states: Secondary | ICD-10-CM | POA: Insufficient documentation

## 2014-06-24 DIAGNOSIS — I10 Essential (primary) hypertension: Secondary | ICD-10-CM | POA: Diagnosis present

## 2014-06-24 DIAGNOSIS — K8020 Calculus of gallbladder without cholecystitis without obstruction: Secondary | ICD-10-CM | POA: Insufficient documentation

## 2014-06-24 DIAGNOSIS — I679 Cerebrovascular disease, unspecified: Secondary | ICD-10-CM | POA: Insufficient documentation

## 2014-06-24 DIAGNOSIS — R1011 Right upper quadrant pain: Secondary | ICD-10-CM | POA: Insufficient documentation

## 2014-06-24 DIAGNOSIS — Z9049 Acquired absence of other specified parts of digestive tract: Secondary | ICD-10-CM | POA: Insufficient documentation

## 2014-06-24 DIAGNOSIS — Z79899 Other long term (current) drug therapy: Secondary | ICD-10-CM | POA: Diagnosis not present

## 2014-06-24 DIAGNOSIS — Z86010 Personal history of colonic polyps: Secondary | ICD-10-CM | POA: Diagnosis not present

## 2014-06-24 DIAGNOSIS — Z7902 Long term (current) use of antithrombotics/antiplatelets: Secondary | ICD-10-CM | POA: Insufficient documentation

## 2014-06-24 DIAGNOSIS — I252 Old myocardial infarction: Secondary | ICD-10-CM | POA: Insufficient documentation

## 2014-06-24 DIAGNOSIS — R740 Nonspecific elevation of levels of transaminase and lactic acid dehydrogenase [LDH]: Secondary | ICD-10-CM

## 2014-06-24 DIAGNOSIS — R531 Weakness: Secondary | ICD-10-CM | POA: Diagnosis present

## 2014-06-24 DIAGNOSIS — R52 Pain, unspecified: Secondary | ICD-10-CM

## 2014-06-24 DIAGNOSIS — R0602 Shortness of breath: Secondary | ICD-10-CM | POA: Diagnosis not present

## 2014-06-24 DIAGNOSIS — Z7982 Long term (current) use of aspirin: Secondary | ICD-10-CM | POA: Insufficient documentation

## 2014-06-24 DIAGNOSIS — R7401 Elevation of levels of liver transaminase levels: Secondary | ICD-10-CM | POA: Diagnosis present

## 2014-06-24 HISTORY — DX: Atherosclerotic heart disease of native coronary artery without angina pectoris: I25.10

## 2014-06-24 LAB — COMPREHENSIVE METABOLIC PANEL
ALBUMIN: 3.5 g/dL (ref 3.5–5.2)
ALT: 268 U/L — ABNORMAL HIGH (ref 0–53)
AST: 167 U/L — ABNORMAL HIGH (ref 0–37)
Alkaline Phosphatase: 265 U/L — ABNORMAL HIGH (ref 39–117)
Anion gap: 6 (ref 5–15)
BUN: 14 mg/dL (ref 6–23)
CO2: 22 mmol/L (ref 19–32)
CREATININE: 0.96 mg/dL (ref 0.50–1.35)
Calcium: 9.3 mg/dL (ref 8.4–10.5)
Chloride: 107 mEq/L (ref 96–112)
GFR calc Af Amer: >90 mL/min (ref >90)
GFR, EST NON AFRICAN AMERICAN: 80 mL/min — AB (ref >90)
Glucose, Bld: 132 mg/dL — ABNORMAL HIGH (ref 70–99)
Potassium: 4 mmol/L (ref 3.5–5.1)
Sodium: 135 mmol/L (ref 135–145)
Total Bilirubin: 3.6 mg/dL — ABNORMAL HIGH (ref 0.3–1.2)
Total Protein: 7.9 g/dL (ref 6.0–8.3)

## 2014-06-24 LAB — URINALYSIS, ROUTINE W REFLEX MICROSCOPIC
Glucose, UA: 100 mg/dL — AB
HGB URINE DIPSTICK: NEGATIVE
Ketones, ur: NEGATIVE mg/dL
Leukocytes, UA: NEGATIVE
Nitrite: NEGATIVE
PROTEIN: NEGATIVE mg/dL
Specific Gravity, Urine: 1.025 (ref 1.005–1.030)
UROBILINOGEN UA: 1 mg/dL (ref 0.0–1.0)
pH: 5.5 (ref 5.0–8.0)

## 2014-06-24 LAB — ACETAMINOPHEN LEVEL: Acetaminophen (Tylenol), Serum: <10 ug/mL — ABNORMAL LOW (ref 10–30)

## 2014-06-24 LAB — CBC WITH DIFFERENTIAL/PLATELET
BASOS ABS: 0 10*3/uL (ref 0.0–0.1)
BASOS PCT: 0 % (ref 0–1)
EOS PCT: 6 % — AB (ref 0–5)
Eosinophils Absolute: 0.6 10*3/uL (ref 0.0–0.7)
HCT: 39.9 % (ref 39.0–52.0)
HEMOGLOBIN: 12.9 g/dL — AB (ref 13.0–17.0)
Lymphocytes Relative: 30 % (ref 12–46)
Lymphs Abs: 3.1 10*3/uL (ref 0.7–4.0)
MCH: 29.4 pg (ref 26.0–34.0)
MCHC: 32.3 g/dL (ref 30.0–36.0)
MCV: 90.9 fL (ref 78.0–100.0)
MONO ABS: 0.8 10*3/uL (ref 0.1–1.0)
Monocytes Relative: 8 % (ref 3–12)
Neutro Abs: 5.6 10*3/uL (ref 1.7–7.7)
Neutrophils Relative %: 56 % (ref 43–77)
Platelets: 346 10*3/uL (ref 150–400)
RBC: 4.39 MIL/uL (ref 4.22–5.81)
RDW: 14.7 % (ref 11.5–15.5)
WBC: 10.2 10*3/uL (ref 4.0–10.5)

## 2014-06-24 LAB — AMYLASE: Amylase: 61 U/L (ref 0–105)

## 2014-06-24 LAB — LIPASE, BLOOD: LIPASE: 34 U/L (ref 11–59)

## 2014-06-24 LAB — BILIRUBIN, DIRECT: Bilirubin, Direct: 2.5 mg/dL — ABNORMAL HIGH (ref 0.0–0.3)

## 2014-06-24 MED ORDER — NITROGLYCERIN 0.4 MG SL SUBL: 0.4000 mg | SUBLINGUAL_TABLET | SUBLINGUAL | Status: DC | PRN

## 2014-06-24 MED ORDER — SODIUM CHLORIDE 0.9 % IV SOLN
INTRAVENOUS | Status: DC
  Administered 2014-06-24 – 2014-06-26 (×4): via INTRAVENOUS

## 2014-06-24 MED ORDER — ASPIRIN EC 81 MG PO TBEC
81.0000 mg | DELAYED_RELEASE_TABLET | Freq: Every evening | ORAL | Status: DC
  Administered 2014-06-24 – 2014-06-26 (×3): 81 mg via ORAL
  Filled 2014-06-24 (×3): qty 1

## 2014-06-24 MED ORDER — SODIUM CHLORIDE 0.9 % IV BOLUS (SEPSIS)
1000.0000 mL | Freq: Once | INTRAVENOUS | Status: AC
  Administered 2014-06-24: 1000 mL via INTRAVENOUS

## 2014-06-24 MED ORDER — IOHEXOL 300 MG/ML  SOLN
100.0000 mL | Freq: Once | INTRAMUSCULAR | Status: AC | PRN
  Administered 2014-06-24: 100 mL via INTRAVENOUS

## 2014-06-24 MED ORDER — ONDANSETRON HCL 4 MG PO TABS: 4.0000 mg | ORAL_TABLET | Freq: Four times a day (QID) | ORAL | Status: DC | PRN

## 2014-06-24 MED ORDER — ONDANSETRON HCL 4 MG/2ML IJ SOLN
4.0000 mg | Freq: Once | INTRAMUSCULAR | Status: AC
Start: 2014-06-24 — End: 2014-06-24
  Administered 2014-06-24: 4 mg via INTRAVENOUS
  Filled 2014-06-24: qty 2

## 2014-06-24 MED ORDER — MORPHINE SULFATE 2 MG/ML IJ SOLN: 1.0000 mg | INTRAMUSCULAR | Status: DC | PRN

## 2014-06-24 MED ORDER — METOPROLOL SUCCINATE ER 25 MG PO TB24
25.0000 mg | ORAL_TABLET | Freq: Every day | ORAL | Status: DC
  Administered 2014-06-25: 25 mg via ORAL
  Filled 2014-06-24 (×3): qty 1

## 2014-06-24 MED ORDER — ONDANSETRON HCL 4 MG/2ML IJ SOLN: 4.0000 mg | Freq: Four times a day (QID) | INTRAMUSCULAR | Status: DC | PRN

## 2014-06-24 MED ORDER — SODIUM CHLORIDE 0.9 % IJ SOLN
3.0000 mL | Freq: Two times a day (BID) | INTRAMUSCULAR | Status: DC
  Administered 2014-06-25 – 2014-06-26 (×3): 3 mL via INTRAVENOUS

## 2014-06-24 MED ORDER — RAMIPRIL 5 MG PO CAPS
10.0000 mg | ORAL_CAPSULE | Freq: Every day | ORAL | Status: DC
  Administered 2014-06-25: 10 mg via ORAL
  Filled 2014-06-24 (×2): qty 1
  Filled 2014-06-24: qty 2

## 2014-06-24 MED ORDER — IOHEXOL 300 MG/ML  SOLN: 25.0000 mL | Freq: Once | INTRAMUSCULAR | Status: AC | PRN

## 2014-06-24 MED ORDER — PANTOPRAZOLE SODIUM 40 MG PO TBEC
40.0000 mg | DELAYED_RELEASE_TABLET | Freq: Every day | ORAL | Status: DC
  Administered 2014-06-24 – 2014-06-27 (×4): 40 mg via ORAL
  Filled 2014-06-24 (×4): qty 1

## 2014-06-24 MED ORDER — PRAVASTATIN SODIUM 10 MG PO TABS
20.0000 mg | ORAL_TABLET | Freq: Every evening | ORAL | Status: DC
  Administered 2014-06-24 – 2014-06-26 (×3): 20 mg via ORAL
  Filled 2014-06-24 (×2): qty 2
  Filled 2014-06-24 (×2): qty 1
  Filled 2014-06-24: qty 2

## 2014-06-24 NOTE — H&P (Addendum)
Hospitalist Admission History and Physical  Patient name: Kevin Reeves Medical record number: [id number] Date of birth: Apr 09, 1941 Age: 73 y.o. Gender: male  Primary Care Provider: Cassell Smiles., MD  Chief Complaint: abd pain, transaminitis   History of Present Illness:This is a 73 y.o. year old male with significant past medical history of CAD s/p stents, HTN, GERD, cerebrovascular disease, cholelithiasis s/p cholecystectomy w/ post procedural intra adominal fluid collection s/p perc drain  presenting with abd pain, transaminitis. Patient had elective cholecystectomy back in October of this year. Patient then had progressive abdominal pain early in November. Was noted to have had an abdominal fluid collection around the side of cholecystectomy. A percutaneous drain was placed by interventional radiology and patient was subsequently discharged. Patient was hospitalized November 17 through November 20 for pertinent drain placement. Was placed on extended course of Cipro and Flagyl for gastrointestinal coverage. Per patient, he was asymptomatic for approximately 2 weeks. However, over the past week, he is had persistent nausea and right upper quadrant pain. Has also had decreased appetite. Nausea and abdominal pain is fairly intermittent/persistent. Independent of eating. Still having bowel movements. No fevers or chills. Was noted to have been taken off Plavix during the time of the abdominal fluid collection concerning for secondary hematoma. Patient states he has not taken Plavix since this point. Is only taking baby aspirin. No chest pain. No diaphoresis. Presents to the [facility] ER today temperature 97.5, heart rate in the 50s to 60s, respirations in the tens to 20s, blood pressure in the 100s to 120s, satting 99% on room air. White blood cell count 10.2, hemoglobin 12.9, creatinine 0.96, bicarbonate 22. Glucose 132. Lipase within normal limits. AST 167, ALT 268, alkaline phosphatase 265, T  bili 3.6. Urinalysis is not indicative of infection. Direct bilirubin 2.5. CT of the abdomen and pelvis shows residual 2.5 x 1.4 cm fluid collection in the gallbladder fossa improved from previous scan. No evidence of bowel obstruction. Normal appendix. Case was discussed with on-call surgeon Dr. Flavia Shipper. Recommends inpatient admission with GI consult in the morning. Surgery will consult in the morning as well.  Assessment and Plan: Kevin Reeves is a 73 y.o. year old male presenting with abdominal pain Active Problems:   Abdominal pain   1-abdominal pain -Status post cholecystectomy and fluid collection -Symptoms mild in comparison to previous -CT of the abdomen and pelvis without any acute findings -Pain control -Anti-emetics -IV fluids -Follow up surgery recommendations in the morning  2-transaminitis/hyperbilirubinemia -New finding in comparison to previous labs -Noted direct hyperbilirubinemia -?, Common Bile duct disease. -No ductal dilatation on CT scan -Discussed case with on-call gastroenterology at [facility] -Patient will likely need MRCP versus ERCP tomorrow -We'll hold off on antibiotics for now. Afebrile, no leukocytosis -hepatitis panel, tylenol level  -Follow  3-coronary artery disease/hypertension -Asymptomatic -BP stable -Only taking baby aspirin given? Hematoma associated with Plavix use status post cholecystectomy -Continue baby aspirin -Consider cardiology consult in the morning -Continue hypertensive regimen  FEN/GI: NPO  Prophylaxis: SCDs (? Intraabdominal fluid collection in the past) Disposition: pending further evaluation  Code Status:Full code   Patient Active Problem List   Diagnosis Date Noted  . Abdominal pain 06/24/2014  . Abdominal fluid collection   . Intra-abdominal abscess post-procedure 05/13/2014  . Carotid stenosis 04/09/2014  . Aftercare following surgery of the circulatory system 04/09/2014  . Arteriosclerotic cardiovascular  disease (ASCVD)   . Cerebrovascular disease   . Hypertension   . Gastroesophageal reflux disease   .  Hyperlipidemia   . Cholelithiasis 07/21/2009   Past Medical History: Past Medical History  Diagnosis Date  . Arteriosclerotic cardiovascular disease (ASCVD)     Stent to RI in 2004. Posterior MI in 4/07 with thrombosis of RI at stent site while off Plavix --> reintervention; do not d/c plavix  . Cerebrovascular disease     Left carotid bruit with 40-59% ICA stenosis; right carotid endarectomy in 2008  . Hypertension   . Colonic polyp 1999    Adenoma resected in 1999; history of diverticulosis  . Gastroesophageal reflux disease     Dilation of esophagel stricture in 2008  . Hyperlipidemia   . Cholelithiasis 07/21/2009    Asymptomatic  . 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 Arthritis   . Coronary artery disease     Past Surgical History: Past Surgical History  Procedure Laterality Date  . Carotid endarterectomy  2008    Right  . Colonoscopy  2008    adenoma resected in 1999; no intervention in 2008  . Cholecystectomy N/A 03/28/2014    Procedure: LAPAROSCOPIC CHOLECYSTECTOMY;  Surgeon: Dalia Heading, MD;  Location: [facility];  Service: General;  Laterality: N/A;  . Cardiac catheterization      cardiac stent placed    Social History: History   Social History  . Marital Status: Married    Spouse Name: N/A    Number of Children: N/A  . Years of Education: N/A   Occupational History  . [employer]     Part time for 50 years-working 3 days per week   Social History Main Topics  . Smoking status: Never Smoker   . Smokeless tobacco: Never Used  . Alcohol Use: No  . Drug Use: No  . Sexual Activity: Yes    Birth Control/ Protection: None   Other Topics Concern  . None   Social History Narrative   Married and resides in [place]     Family History: Family History  Problem Relation Age of Onset  . Heart disease Father     Before age 59  . Hyperlipidemia  Father   . Hypertension Father   . Stroke Other     Grandparent  . Heart attack Other     Grandparent  . Heart block      Brother  . Heart disease Paternal Grandmother   . Heart disease Paternal Grandfather   . Heart disease Brother   . Heart attack Brother   . Heart disease Brother     Before age 54-  Quad-BPG  . Hyperlipidemia Brother   . Hypertension Brother   . Heart attack Brother     Allergies: No Known Allergies  Current Facility-Administered Medications  Medication Dose Route Frequency Provider Last Rate Last Dose  . 0.9 %  sodium chloride infusion   Intravenous Continuous Doree Albee, MD      . aspirin EC tablet 81 mg  81 mg Oral QPM Doree Albee, MD      . iohexol (OMNIPAQUE) 300 MG/ML solution 25 mL  25 mL Oral Once PRN Medication Radiologist, MD      . metoprolol succinate (TOPROL-XL) 24 hr tablet 25 mg  25 mg Oral Daily Doree Albee, MD      . morphine 2 MG/ML injection 1-2 mg  1-2 mg Intravenous Q3H PRN Doree Albee, MD      . nitroGLYCERIN (NITROSTAT) SL tablet 0.4 mg  0.4 mg Sublingual Q5 min PRN Doree Albee, MD      . ondansetron (  ZOFRAN) tablet 4 mg  4 mg Oral Q6H PRN Doree Albee, MD       Or  . ondansetron [facility]) injection 4 mg  4 mg Intravenous Q6H PRN Doree Albee, MD      . pantoprazole (PROTONIX) EC tablet 40 mg  40 mg Oral Daily Doree Albee, MD      . pravastatin (PRAVACHOL) tablet 20 mg  20 mg Oral QPM Doree Albee, MD      . ramipril (ALTACE) capsule 10 mg  10 mg Oral Daily Doree Albee, MD      . sodium chloride 0.9 % injection 3 mL  3 mL Intravenous Q12H Doree Albee, MD       Current Outpatient Prescriptions  Medication Sig Dispense Refill  . aspirin EC 81 MG tablet Take 81 mg by mouth every evening.     . metoprolol succinate (TOPROL-XL) 25 MG 24 hr tablet Take 25 mg by mouth daily.      . Multiple Vitamin (MULTIVITAMIN) tablet Take 1 tablet by mouth daily.      Marland Kitchen NITROSTAT 0.4 MG SL tablet DISSOLVE 1 TABLET UNDER  TONGUE EVERY  5 MINUTES UPTO 15 MIN FOR CHEST PAIN.IF NO RELIEF CALL 911. 25 each 6  . omeprazole (PRILOSEC) 20 MG capsule Take 20 mg by mouth daily.    . pravastatin (PRAVACHOL) 20 MG tablet Take 1 tablet (20 mg total) by mouth every evening. 90 tablet 0  . ramipril (ALTACE) 10 MG capsule Take 10 mg by mouth daily.      . ciprofloxacin (CIPRO) 500 MG tablet Take 1 tablet (500 mg total) by mouth every 12 (twelve) hours. (Patient not taking: Reported on 06/24/2014) 14 tablet 0  . metroNIDAZOLE (FLAGYL) 500 MG tablet Take 1 tablet (500 mg total) by mouth 3 (three) times daily. (Patient not taking: Reported on 06/24/2014) 21 tablet 0  . oxyCODONE-acetaminophen (PERCOCET/ROXICET) 5-325 MG per tablet Take 1-2 tablets by mouth every 4 (four) hours as needed for severe pain. (Patient not taking: Reported on 06/24/2014) 25 tablet 0   Review Of Systems: 12 point ROS negative except as noted above in HPI.  Physical Exam: Filed Vitals:   06/24/14 2145  BP:   Pulse: 58  Temp:   Resp:     General: alert, cooperative and appears stated age HEENT: PERRLA and extra ocular movement intact Heart: S1, S2 normal, no murmur, rub or gallop, regular rate and rhythm Lungs: clear to auscultation, no wheezes or rales and unlabored breathing Abdomen: + bowel sounds, minimal TTP in RUQ  Extremities: extremities normal, atraumatic, no cyanosis or edema Skin:no rashes, no ecchymoses Neurology: normal without focal findings  Labs and Imaging: Lab Results  Component Value Date/Time   NA 135 06/24/2014 06:45 PM   K 4.0 06/24/2014 06:45 PM   CL 107 06/24/2014 06:45 PM   CO2 22 06/24/2014 06:45 PM   BUN 14 06/24/2014 06:45 PM   CREATININE 0.96 06/24/2014 06:45 PM   CREATININE 1.14 12/03/2013 08:48 AM   GLUCOSE 132* 06/24/2014 06:45 PM   Lab Results  Component Value Date   WBC 10.2 06/24/2014   HGB 12.9* 06/24/2014   HCT 39.9 06/24/2014   MCV 90.9 06/24/2014   PLT 346 06/24/2014   Urinalysis    Component Value  Date/Time   COLORURINE AMBER* 06/24/2014 1945   APPEARANCEUR CLEAR 06/24/2014 1945   LABSPEC 1.025 06/24/2014 1945   PHURINE 5.5 06/24/2014 1945   GLUCOSEU 100* 06/24/2014 1945   HGBUR NEGATIVE 06/24/2014 1945  BILIRUBINUR LARGE* 06/24/2014 1945   KETONESUR NEGATIVE 06/24/2014 1945   PROTEINUR NEGATIVE 06/24/2014 1945   UROBILINOGEN 1.0 06/24/2014 1945   NITRITE NEGATIVE 06/24/2014 1945   LEUKOCYTESUR NEGATIVE 06/24/2014 1945      Ct Abdomen Pelvis W Contrast  06/24/2014   CLINICAL DATA:  Generalized abdominal pain x1 week, prior cholecystectomy on 03/27/2014, nausea  EXAM: CT ABDOMEN AND PELVIS WITH CONTRAST  TECHNIQUE: Multidetector CT imaging of the abdomen and pelvis was performed using the standard protocol following bolus administration of intravenous contrast.  CONTRAST:  OMNIPAQUE IOHEXOL 300 MG/ML  SOLN  COMPARISON:  05/09/2014  FINDINGS: Lower chest:  Mild linear scarring at the lung bases.  Prominent vein draining into the coronary sinus.  Hepatobiliary: Liver is within normal limits.  Status post cholecystectomy. Residual 2.5 x 1.4 cm fluid collection in the gallbladder fossa, previously 5.5 x 7.2 cm. No intrahepatic or extrahepatic ductal dilatation.  Pancreas: Within normal limits.  Spleen: Within normal limits.  Adrenals/Urinary Tract: Adrenal glands are unremarkable.  4.1 cm cyst in the medial right upper kidney (series 2/ image 23). Left kidney is within normal limits. No hydronephrosis.  Bladder is within normal limits.  Stomach/Bowel: Stomach is unremarkable.  No evidence of bowel obstruction.  Normal appendix.  Extensive colonic diverticulosis, without evidence of diverticulitis.  Vascular/Lymphatic: No evidence of abdominal aortic aneurysm.  No suspicious abdominopelvic lymphadenopathy.  Reproductive: Prostate is unremarkable.  Other: No abdominopelvic ascites.  Small fat containing left inguinal hernia.  Musculoskeletal: Degenerative changes of the visualized  thoracolumbar spine.  IMPRESSION: Status post cholecystectomy.  Residual 2.5 x 1.4 cm fluid collection in the gallbladder fossa, significantly improved.  No evidence of bowel obstruction.  Normal appendix.  Additional stable ancillary findings as above.   Electronically Signed   By: Charline Bills M.D.   On: 06/24/2014 21:15           Doree Albee MD  Pager: [phone]

## 2014-06-24 NOTE — ED Notes (Signed)
Verbal order from Dr. Estell Harpin for pt. To be transported to floor off tele.

## 2014-06-24 NOTE — ED Notes (Signed)
EDP at bedside  

## 2014-06-24 NOTE — ED Notes (Signed)
Attempted to call report, RN to call back.

## 2014-06-24 NOTE — ED Provider Notes (Signed)
CSN: [id number]     Arrival date & time 06/24/14  1509 History  This chart was scribed for Kevin Lennert, MD by [place] Peace, ED Scribe. The patient was seen in [facility]. The patient's care was started at 6:55 PM.    Chief Complaint  Patient presents with  . Weakness      Patient is a 73 y.o. male presenting with weakness. The history is provided by the patient and the spouse. No language interpreter was used.  Weakness Associated symptoms include abdominal pain and shortness of breath. Pertinent negatives include no chest pain and no headaches.    HPI Comments: Kevin Reeves is a 73 y.o. male who presents to the Emergency Department complaining of abdominal pain. Wife reports history of cholecystectomy on October 2nd, 2015. Pt was started back on Plavix 5 days later. 2 weeks after surgery, pt began feeling bad again. Wife notes pt would become nauseous when thinking about food. Wife adds that pt had drainage of abdomen performed at [facility] on right side of abdomen where he had surgery. Pt got better for a couple weeks before symptoms came back again 1 week ago. Pt not complains of abdominal pain, change in color of urine (orange), weakness, and SOB. No complaints of vomiting. PCP is Dr. Sherwood Gambler. Pt does not have gastroenterologist that he follows up with.    Past Medical History  Diagnosis Date  . Arteriosclerotic cardiovascular disease (ASCVD)     Stent to RI in 2004. Posterior MI in 4/07 with thrombosis of RI at stent site while off Plavix --> reintervention; do not d/c plavix  . Cerebrovascular disease     Left carotid bruit with 40-59% ICA stenosis; right carotid endarectomy in 2008  . Hypertension   . Colonic polyp 1999    Adenoma resected in 1999; history of diverticulosis  . Gastroesophageal reflux disease     Dilation of esophagel stricture in 2008  . Hyperlipidemia   . Cholelithiasis 07/21/2009    Asymptomatic  . 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 Arthritis   .  Coronary artery disease    Past Surgical History  Procedure Laterality Date  . Carotid endarterectomy  2008    Right  . Colonoscopy  2008    adenoma resected in 1999; no intervention in 2008  . Cholecystectomy N/A 03/28/2014    Procedure: LAPAROSCOPIC CHOLECYSTECTOMY;  Surgeon: Dalia Heading, MD;  Location: [facility];  Service: General;  Laterality: N/A;  . Cardiac catheterization      cardiac stent placed   Family History  Problem Relation Age of Onset  . Heart disease Father     Before age 39  . Hyperlipidemia Father   . Hypertension Father   . Stroke Other     Grandparent  . Heart attack Other     Grandparent  . Heart block      Brother  . Heart disease Paternal Grandmother   . Heart disease Paternal Grandfather   . Heart disease Brother   . Heart attack Brother   . Heart disease Brother     Before age 53-  Quad-BPG  . Hyperlipidemia Brother   . Hypertension Brother   . Heart attack Brother    History  Substance Use Topics  . Smoking status: Never Smoker   . Smokeless tobacco: Never Used  . Alcohol Use: No    Review of Systems  Constitutional: Negative for appetite change and fatigue.  HENT: Negative for congestion, ear discharge and sinus pressure.  Eyes: Negative for discharge.  Respiratory: Positive for shortness of breath. Negative for cough.   Cardiovascular: Negative for chest pain.  Gastrointestinal: Positive for nausea and abdominal pain. Negative for vomiting and diarrhea.  Genitourinary: Negative for frequency and hematuria.       Change in color of urine (orange).  Musculoskeletal: Negative for back pain.  Skin: Negative for rash.  Neurological: Positive for weakness. Negative for seizures and headaches.  Psychiatric/Behavioral: Negative for hallucinations.      Allergies  Review of patient's allergies indicates no known allergies.  Home Medications   Prior to Admission medications   Medication Sig Start Date End Date Taking? Authorizing  Provider  aspirin EC 81 MG tablet Take 81 mg by mouth every evening.     Historical Provider, MD  ciprofloxacin (CIPRO) 500 MG tablet Take 1 tablet (500 mg total) by mouth every 12 (twelve) hours. 05/09/14   Raeford Razor, MD  metoprolol succinate (TOPROL-XL) 25 MG 24 hr tablet Take 25 mg by mouth daily.      Historical Provider, MD  metroNIDAZOLE (FLAGYL) 500 MG tablet Take 1 tablet (500 mg total) by mouth 3 (three) times daily. 05/09/14   Raeford Razor, MD  Multiple Vitamin (MULTIVITAMIN) tablet Take 1 tablet by mouth daily.      Historical Provider, MD  NITROSTAT 0.4 MG SL tablet DISSOLVE 1 TABLET UNDER  TONGUE EVERY 5 MINUTES UPTO 15 MIN FOR CHEST PAIN.IF NO RELIEF CALL 911. 02/22/11   Kathlen Brunswick, MD  omeprazole (PRILOSEC) 20 MG capsule Take 20 mg by mouth daily.    Historical Provider, MD  oxyCODONE-acetaminophen (PERCOCET/ROXICET) 5-325 MG per tablet Take 1-2 tablets by mouth every 4 (four) hours as needed for severe pain. 05/09/14   Raeford Razor, MD  pravastatin (PRAVACHOL) 20 MG tablet Take 1 tablet (20 mg total) by mouth every evening. 03/24/14   Antoine Poche, MD  ramipril (ALTACE) 10 MG capsule Take 10 mg by mouth daily.      Historical Provider, MD   BP 103/62 mmHg  Pulse 64  Temp(Src) 97.5 F (36.4 C) (Oral)  Resp 20  Ht 5\' 6"  (1.676 m)  Wt 176 lb (79.833 kg)  BMI 28.42 kg/m2  SpO2 100% Physical Exam  Constitutional: He is oriented to person, place, and time. He appears well-developed.  HENT:  Head: Normocephalic.  Mouth/Throat: Mucous membranes are dry.  Eyes: Conjunctivae and EOM are normal. No scleral icterus.  Neck: Neck supple. No thyromegaly present.  Cardiovascular: Normal rate and regular rhythm.  Exam reveals no gallop and no friction rub.   No murmur heard. Pulmonary/Chest: No stridor. He has no wheezes. He has no rales. He exhibits no tenderness.  Abdominal: He exhibits no distension. There is tenderness. There is no rebound.  Mild RUQ tenderness.    Musculoskeletal: Normal range of motion. He exhibits no edema.  Lymphadenopathy:    He has no cervical adenopathy.  Neurological: He is oriented to person, place, and time. He exhibits normal muscle tone. Coordination normal.  Skin: No rash noted. No erythema.  Psychiatric: He has a normal mood and affect. His behavior is normal.    ED Course  Procedures (including critical care time) Labs Review Labs Reviewed  CBC WITH DIFFERENTIAL  COMPREHENSIVE METABOLIC PANEL  LIPASE, BLOOD  AMYLASE    Imaging Review No results found.   EKG Interpretation None     Medications - No data to display  7:01 PM- Treatment plan was discussed with patient who verbalizes understanding  and agrees.   MDM   Final diagnoses:  None    Admit for elevated,  Liver enzymes.   Dr. Lovell Sheehan feels this had nothing to do with his surgery over one month ago.  He suggest gi consult in am and admit  I personally performed the services described in this documentation, which was scribed in my presence. The recorded information has been reviewed and is accurate.   Kevin Lennert, MD 06/24/14 2204

## 2014-06-24 NOTE — ED Notes (Signed)
Cholecystectomy  October 1 Now having dark yellow urine, and stools like  Brown mustard, weak, sob,   Nausea, stools are loose, decreased po intake. No fever

## 2014-06-25 ENCOUNTER — Encounter (HOSPITAL_COMMUNITY): Payer: Self-pay | Admitting: Gastroenterology

## 2014-06-25 ENCOUNTER — Observation Stay (HOSPITAL_COMMUNITY): Payer: Medicare HMO

## 2014-06-25 DIAGNOSIS — R7989 Other specified abnormal findings of blood chemistry: Secondary | ICD-10-CM

## 2014-06-25 DIAGNOSIS — E785 Hyperlipidemia, unspecified: Secondary | ICD-10-CM

## 2014-06-25 DIAGNOSIS — K219 Gastro-esophageal reflux disease without esophagitis: Secondary | ICD-10-CM

## 2014-06-25 DIAGNOSIS — R740 Nonspecific elevation of levels of transaminase and lactic acid dehydrogenase [LDH]: Secondary | ICD-10-CM

## 2014-06-25 DIAGNOSIS — R945 Abnormal results of liver function studies: Secondary | ICD-10-CM

## 2014-06-25 DIAGNOSIS — I10 Essential (primary) hypertension: Secondary | ICD-10-CM

## 2014-06-25 LAB — COMPREHENSIVE METABOLIC PANEL
ALT: 222 U/L — ABNORMAL HIGH (ref 0–53)
ANION GAP: 6 (ref 5–15)
AST: 141 U/L — ABNORMAL HIGH (ref 0–37)
Albumin: 2.8 g/dL — ABNORMAL LOW (ref 3.5–5.2)
Alkaline Phosphatase: 219 U/L — ABNORMAL HIGH (ref 39–117)
BUN: 10 mg/dL (ref 6–23)
CO2: 22 mmol/L (ref 19–32)
CREATININE: 0.95 mg/dL (ref 0.50–1.35)
Calcium: 8.5 mg/dL (ref 8.4–10.5)
Chloride: 109 mEq/L (ref 96–112)
GFR, EST NON AFRICAN AMERICAN: 81 mL/min — AB (ref >90)
GLUCOSE: 90 mg/dL (ref 70–99)
Potassium: 3.8 mmol/L (ref 3.5–5.1)
Sodium: 137 mmol/L (ref 135–145)
Total Bilirubin: 3.3 mg/dL — ABNORMAL HIGH (ref 0.3–1.2)
Total Protein: 6.3 g/dL (ref 6.0–8.3)

## 2014-06-25 LAB — CBC WITH DIFFERENTIAL/PLATELET
Basophils Absolute: 0 10*3/uL (ref 0.0–0.1)
Basophils Relative: 0 % (ref 0–1)
Eosinophils Absolute: 0.5 10*3/uL (ref 0.0–0.7)
Eosinophils Relative: 7 % — ABNORMAL HIGH (ref 0–5)
HCT: 35.3 % — ABNORMAL LOW (ref 39.0–52.0)
HEMOGLOBIN: 11.2 g/dL — AB (ref 13.0–17.0)
LYMPHS ABS: 2.3 10*3/uL (ref 0.7–4.0)
Lymphocytes Relative: 33 % (ref 12–46)
MCH: 28.6 pg (ref 26.0–34.0)
MCHC: 31.7 g/dL (ref 30.0–36.0)
MCV: 90.3 fL (ref 78.0–100.0)
MONOS PCT: 12 % (ref 3–12)
Monocytes Absolute: 0.8 10*3/uL (ref 0.1–1.0)
NEUTROS ABS: 3.4 10*3/uL (ref 1.7–7.7)
NEUTROS PCT: 48 % (ref 43–77)
PLATELETS: 297 10*3/uL (ref 150–400)
RBC: 3.91 MIL/uL — ABNORMAL LOW (ref 4.22–5.81)
RDW: 14.9 % (ref 11.5–15.5)
WBC: 7 10*3/uL (ref 4.0–10.5)

## 2014-06-25 LAB — HEPATITIS PANEL, ACUTE
HCV AB: NEGATIVE
HEP B C IGM: NONREACTIVE
Hep A IgM: NONREACTIVE
Hepatitis B Surface Ag: NEGATIVE

## 2014-06-25 MED ORDER — GADOBENATE DIMEGLUMINE 529 MG/ML IV SOLN
15.0000 mL | Freq: Once | INTRAVENOUS | Status: AC | PRN
  Administered 2014-06-25: 15 mL via INTRAVENOUS

## 2014-06-25 MED ORDER — RAMIPRIL 5 MG PO CAPS: 5.0000 mg | ORAL_CAPSULE | Freq: Every day | ORAL | Status: DC

## 2014-06-25 NOTE — Consult Note (Addendum)
Referring Provider:   Dr. Alvester Morin  Primary Care Physician:  Cassell Smiles., MD Primary Gastroenterologist:  Dr. Jena Gauss   Date of Admission: 06/24/14 Date of Consultation: 06/25/14  Reason for Consultation:  Elevated LFTs  HPI:  Kevin DEGRAZIA is a 73 y.o. year old male status post laparoscopic cholecystectomy Oct 2015 and recently hospitalized Nov 2015 with worsening RUQ pain and lfuid collection in subhepatic space; HIDA scan at that time negative for bile leak. Percutaneous drainage performed with JP drain placed via IR; cultures revealed gram-positive cocci. JP drain now removed. Clinically improved at time of discharge in Nov 2015.   Presented 12/29 to the ED with week long abdominal pressure, fatigue and T bili 3.6, ASt 167, ALT 268, AP 265, direct bilirubin 2.5. CT showed residual fluid collection in gallbladder fossa improved. Hepatitis panel in process.   States he had been recovering and eating better but then last Tuesday noted significant decrease in appetite. Felt weak. No nausea or vomiting. Felt a little "tender" in RUQ but not pain per se. Described more as a pressure. Started having pressure in RUQ. Felt "full". No association with eating. Wife present saying yesterday he ate 1/2 piece of toast and small amount of cereal. Significantly decreased oral intake. Has had no appetite for food. 2 nights ago around midnight started having diarrhea, now eased off. No hematochezia. Stool has been yellowish/brown like mustard. Urine turned orange, which concerned patient. NO PLAVIX since May 09, 2014.   Wife wanted to mention LFTs were elevated in April 2015; cardiologist had patient held the statin and repeated with resolution of LFTs to normal June 2015.    Past Medical History  Diagnosis Date  . Arteriosclerotic cardiovascular disease (ASCVD)     Stent to RI in 2004. Posterior MI in 4/07 with thrombosis of RI at stent site while off Plavix --> reintervention; do not d/c plavix   . Cerebrovascular disease     Left carotid bruit with 40-59% ICA stenosis; right carotid endarectomy in 2008  . Hypertension   . Colonic polyp 1999    Adenoma resected in 1999; history of diverticulosis  . Gastroesophageal reflux disease     Dilation of esophagel stricture in 2008  . Hyperlipidemia   . Cholelithiasis 07/21/2009    Asymptomatic  . 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 Arthritis   . Coronary artery disease     Past Surgical History  Procedure Laterality Date  . Carotid endarterectomy  2008    Right  . Colonoscopy  2008    adenoma resected in 1999; no intervention in 2008  . Cholecystectomy N/A 03/28/2014    Procedure: LAPAROSCOPIC CHOLECYSTECTOMY;  Surgeon: Dalia Heading, MD;  Location: [facility];  Service: General;  Laterality: N/A;  . Cardiac catheterization      cardiac stent placed  . Colonoscopy with esophagogastroduodenoscopy (egd)  Feb 2010    Dr. Jena Gauss: mild erosive reflux esophagitis, non-critical Schatzki's ring without dilation, moderate sized hiatal hernia, bulbar erosions. Negative H.pylori serology. Colonoscopy with normal rectum, pancolonic diverticula, needs surveillance every 5 years due to history of adenomas.     Prior to Admission medications   Medication Sig Start Date End Date Taking? Authorizing Provider  aspirin EC 81 MG tablet Take 81 mg by mouth every evening.    Yes Historical Provider, MD  metoprolol succinate (TOPROL-XL) 25 MG 24 hr tablet Take 25 mg by mouth daily.     Yes Historical Provider, MD  Multiple Vitamin (MULTIVITAMIN) tablet Take 1  tablet by mouth daily.     Yes Historical Provider, MD  NITROSTAT 0.4 MG SL tablet DISSOLVE 1 TABLET UNDER  TONGUE EVERY 5 MINUTES UPTO 15 MIN FOR CHEST PAIN.IF NO RELIEF CALL 911. 02/22/11  Yes Kathlen Brunswick, MD  omeprazole (PRILOSEC) 20 MG capsule Take 20 mg by mouth daily.   Yes Historical Provider, MD  pravastatin (PRAVACHOL) 20 MG tablet Take 1 tablet (20 mg total) by mouth every evening. 03/24/14   Yes Antoine Poche, MD  ramipril (ALTACE) 10 MG capsule Take 10 mg by mouth daily.     Yes Historical Provider, MD  ciprofloxacin (CIPRO) 500 MG tablet Take 1 tablet (500 mg total) by mouth every 12 (twelve) hours. Patient not taking: Reported on 06/24/2014 05/09/14   Raeford Razor, MD  metroNIDAZOLE (FLAGYL) 500 MG tablet Take 1 tablet (500 mg total) by mouth 3 (three) times daily. Patient not taking: Reported on 06/24/2014 05/09/14   Raeford Razor, MD  oxyCODONE-acetaminophen (PERCOCET/ROXICET) 5-325 MG per tablet Take 1-2 tablets by mouth every 4 (four) hours as needed for severe pain. Patient not taking: Reported on 06/24/2014 05/09/14   Raeford Razor, MD    Current Facility-Administered Medications  Medication Dose Route Frequency Provider Last Rate Last Dose  . 0.9 %  sodium chloride infusion   Intravenous Continuous Doree Albee, MD 125 mL/hr at 06/24/14 2358    . aspirin EC tablet 81 mg  81 mg Oral QPM Doree Albee, MD   81 mg at 06/24/14 2357  . metoprolol succinate (TOPROL-XL) 24 hr tablet 25 mg  25 mg Oral Daily Doree Albee, MD      . morphine 2 MG/ML injection 1-2 mg  1-2 mg Intravenous Q3H PRN Doree Albee, MD      . nitroGLYCERIN (NITROSTAT) SL tablet 0.4 mg  0.4 mg Sublingual Q5 min PRN Doree Albee, MD      . ondansetron  [facility]) tablet 4 mg  4 mg Oral Q6H PRN Doree Albee, MD       Or  . ondansetron [facility]) injection 4 mg  4 mg Intravenous Q6H PRN Doree Albee, MD      . pantoprazole (PROTONIX) EC tablet 40 mg  40 mg Oral Daily Doree Albee, MD   40 mg at 06/24/14 2357  . pravastatin (PRAVACHOL) tablet 20 mg  20 mg Oral QPM Doree Albee, MD   20 mg at 06/24/14 2357  . ramipril (ALTACE) capsule 10 mg  10 mg Oral Daily Doree Albee, MD      . sodium chloride 0.9 % injection 3 mL  3 mL Intravenous Q12H Doree Albee, MD   3 mL at 06/24/14 2200    Allergies as of 06/24/2014  . (No Known Allergies)    Family History  Problem Relation Age of Onset  . Heart  disease Father     Before age 71  . Hyperlipidemia Father   . Hypertension Father   . Stroke Other     Grandparent  . Heart attack Other     Grandparent  . Heart block      Brother  . Heart disease Paternal Grandmother   . Heart disease Paternal Grandfather   . Heart disease Brother   . Heart attack Brother   . Heart disease Brother     Before age 63-  Quad-BPG  . Hyperlipidemia Brother   . Hypertension Brother   . Heart attack Brother   . Colon cancer Neg Hx     History   Social  History  . Marital Status: Married    Spouse Name: N/A    Number of Children: N/A  . Years of Education: N/A   Occupational History  . [employer]     Part time for 50 years-working 3 days per week, now retired June 2015.    Social History Main Topics  . Smoking status: Never Smoker   . Smokeless tobacco: Never Used  . Alcohol Use: No  . Drug Use: No  . Sexual Activity: Yes    Birth Control/ Protection: None   Other Topics Concern  . Not on file   Social History Narrative   Married and resides in [place]     Review of Systems: Gen: see HPI CV: Denies chest pain, heart palpitations, syncope, edema  Resp: Denies shortness of breath with rest, cough, wheezing GI: see HPI GU : see HPI MS: Denies joint pain,swelling, cramping Derm: +Dry skin Psych: Denies depression, anxiety,confusion, or memory loss Heme: Denies bruising, bleeding, and enlarged lymph nodes.  Physical Exam: Vital signs in last 24 hours: Temp:  [97.5 F (36.4 C)-97.8 F (36.6 C)] 97.5 F (36.4 C) (12/30 0635) Pulse Rate:  [58-79] 67 (12/30 0635) Resp:  [17-21] 18 (12/30 0635) BP: (96-141)/(56-81) 96/63 mmHg (12/30 0635) SpO2:  [98 %-100 %] 98 % (12/30 0635) Weight:  [176 lb (79.833 kg)-179 lb 12.8 oz (81.557 kg)] 179 lb 12.8 oz (81.557 kg) (12/30 0700) Last BM Date: 06/24/14 General:   Alert,  Well-developed, well-nourished, pleasant and cooperative in NAD, mild jaundice Head:  Normocephalic and  atraumatic. Eyes:  Very mild scleral icterus Ears:  Normal auditory acuity. Nose:  No deformity, discharge,  or lesions. Mouth:  No deformity or lesions, dentition normal. Lungs:  Clear throughout to auscultation.   No wheezes, crackles, or rhonchi. No acute distress. Heart:  S1 S2 present without appreciable murmur Abdomen:  Soft, TTP RUQ with deep palpation and nondistended. No masses, hepatosplenomegaly or hernias noted. Normal bowel sounds, without guarding, and without rebound.   Rectal:  Deferred until time of colonoscopy.   Msk:  Symmetrical without gross deformities. Normal posture. Extremities:  Without  edema. Neurologic:  Alert and  oriented x4;  grossly normal neurologically. Psych:  Alert and cooperative. Normal mood and affect.  Intake/Output from previous day:   Intake/Output this shift:    Lab Results:  Recent Labs  06/24/14 1845 06/25/14 0557  WBC 10.2 7.0  HGB 12.9* 11.2*  HCT 39.9 35.3*  PLT 346 297   BMET  Recent Labs  06/24/14 1845 06/25/14 0557  NA 135 137  K 4.0 3.8  CL 107 109  CO2 22 22  GLUCOSE 132* 90  BUN 14 10  CREATININE 0.96 0.95  CALCIUM 9.3 8.5   LFT  Recent Labs  06/24/14 1845 06/24/14 2119 06/25/14 0557  PROT 7.9  --  6.3  ALBUMIN 3.5  --  2.8*  AST 167*  --  141*  ALT 268*  --  222*  ALKPHOS 265*  --  219*  BILITOT 3.6*  --  3.3*  BILIDIR  --  2.5*  --    Lab Results  Component Value Date   LIPASE 34 06/24/2014    Studies/Results: Ct Abdomen Pelvis W Contrast  06/24/2014   CLINICAL DATA:  Generalized abdominal pain x1 week, prior cholecystectomy on 03/27/2014, nausea  EXAM: CT ABDOMEN AND PELVIS WITH CONTRAST  TECHNIQUE: Multidetector CT imaging of the abdomen and pelvis was performed using the standard protocol following bolus administration  of intravenous contrast.  CONTRAST:  OMNIPAQUE IOHEXOL 300 MG/ML  SOLN  COMPARISON:  05/09/2014  FINDINGS: Lower chest:  Mild linear scarring at the lung bases.   Prominent vein draining into the coronary sinus.  Hepatobiliary: Liver is within normal limits.  Status post cholecystectomy. Residual 2.5 x 1.4 cm fluid collection in the gallbladder fossa, previously 5.5 x 7.2 cm. No intrahepatic or extrahepatic ductal dilatation.  Pancreas: Within normal limits.  Spleen: Within normal limits.  Adrenals/Urinary Tract: Adrenal glands are unremarkable.  4.1 cm cyst in the medial right upper kidney (series 2/ image 23). Left kidney is within normal limits. No hydronephrosis.  Bladder is within normal limits.  Stomach/Bowel: Stomach is unremarkable.  No evidence of bowel obstruction.  Normal appendix.  Extensive colonic diverticulosis, without evidence of diverticulitis.  Vascular/Lymphatic: No evidence of abdominal aortic aneurysm.  No suspicious abdominopelvic lymphadenopathy.  Reproductive: Prostate is unremarkable.  Other: No abdominopelvic ascites.  Small fat containing left inguinal hernia.  Musculoskeletal: Degenerative changes of the visualized thoracolumbar spine.  IMPRESSION: Status post cholecystectomy.  Residual 2.5 x 1.4 cm fluid collection in the gallbladder fossa, significantly improved.  No evidence of bowel obstruction.  Normal appendix.  Additional stable ancillary findings as above.   Electronically Signed   By: Charline Bills M.D.   On: 06/24/2014 21:15    Impression: 73 year old male admitted with fatigue, RUQ abdominal pressure, and elevated LFTs with concern for possible microlithiasis, retained CBD stone. Underwent laparoscopic cholecystectomy in Oct 2015 with subsequent postoperative hematoma in the setting of Plavix, requiring JP drain; this has since resolved and CT shows improvement in residual fluid collection.  No evidence of CBD dilatation on CT but LFT pattern concerning. Acute hepatitis panel pending for completeness' sake. Will proceed with MRCP for further evaluation. Discussed with patient the possibility of an ERCP. As of note, he has had NO  Plavix since Nov 2015.   As separate issue: due for surveillance colonoscopy now. Will arrange as outpatient. No concerning lower GI symptoms.   Plan: MRCP now May ultimately need ERCP but will await MRCP findings Follow-up on pending hepatitis panel Protonix for GI prophylaxis Continue to hold Plavix Follow-up on pending hepatitis panel Outpatient colonoscopy due to history of adenomas  Nira Retort, ANP-BC [facility]      LOS: 1 day    06/25/2014, 8:23 AM  Attending note:  Patient seen and examined. MRCP reviewed. No biliary dilation. No evidence retained stone.  It is notable he says he feels well this afternoon; has no abdominal pain, whatsoever. He he is minimally tender to right upper quadrant palpation.   Resolving fluid collection on CT reassuring.  It is certainly possible he may have had a common duct stone which passed along the way. It is also possible he may have had a coexisting bile leak which spontaneously sealed off. Bump in LFTs may be a telltale sign of such a process.  No need for ERCP at this time. Will follow with you.  We'll advance to a clear liquid diet.

## 2014-06-25 NOTE — Progress Notes (Signed)
[facility] PROGRESS NOTE  Kevin Reeves QIH:[id number] DOB: 1940/09/28 DOA: 06/24/2014 PCP: Cassell Smiles., MD  Assessment/Plan: #1 abdominal pain/transaminitis Questionable etiology. Clinical improvement. Concern for retained common bile duct stone. Patient is status post laparoscopic cholecystectomy October 2015. Patient with abdominal pain November 2015 with fluid collection nor noted in the subhepatic space. Height a scan at that time was negative. Patient status post percutaneous drainage with JP drain IR now removed November 2015. Patient presented with abdominal pain noted to have a transaminitis. CT scan shows residual fluid collection gallbladder fossa has improved. Acute hepatitis panel is pending. LFTs are trending down. Patient for MRCP today. Patient may need a ERCP. GI following and general surgery following and appreciate input and recommendations.  #2 coronary artery disease Stable. Patient denies any chest pain. Patient currently on aspirin. Plavix was held as patient was noted to have a hematoma status post cholecystectomy. Continue to hold Plavix for now in anticipation of possible ERCP per GI. GI to advice when Plavix may be resumed.  #3 hypertension Patient's blood pressure borderline with systolics in the high 90s. Patient currently asymptomatic. Continue metoprolol. Will decrease ramipril to 5 mg daily. Follow.  #4 gastroesophageal reflux disease PPI.  #5 hyperlipidemia Continue Pravachol.  #6 prophylaxis PPI for GI prophylaxis. SCDs for DVT prophylaxis.   Code Status: Full Family Communication: Updated patient and wife at bedside. Disposition Plan: Remain inpatient   Consultants:  General surgery. Dr. Lovell Sheehan 06/25/2014  Gastroenterology: Gerrit Halls, NP 06/25/2014  Procedures:  CT abdomen and pelvis 06/24/2014  Antibiotics:  None  HPI/Subjective: Patient states abdominal pain is improved. Patient denies any nausea, no vomiting, no  current abdominal pain. No chest pain. No shortness of breath.  Objective: Filed Vitals:   06/25/14 0635  BP: 96/63  Pulse: 67  Temp: 97.5 F (36.4 C)  Resp: 18    Intake/Output Summary (Last 24 hours) at 06/25/14 1028 Last data filed at 06/25/14 0941  Gross per 24 hour  Intake      3 ml  Output      0 ml  Net      3 ml   Filed Weights   06/24/14 1551 06/24/14 2324 06/25/14 0700  Weight: 79.833 kg (176 lb) 79.833 kg (176 lb) 81.557 kg (179 lb 12.8 oz)    Exam:   General:  NAD  Cardiovascular: RRR  Respiratory: CTAB  Abdomen: Soft, nontender, nondistended, positive bowel sounds.  Musculoskeletal: No clubbing cyanosis or edema.  Data Reviewed: Basic Metabolic Panel:  Recent Labs Lab 06/24/14 1845 06/25/14 0557  NA 135 137  K 4.0 3.8  CL 107 109  CO2 22 22  GLUCOSE 132* 90  BUN 14 10  CREATININE 0.96 0.95  CALCIUM 9.3 8.5   Liver Function Tests:  Recent Labs Lab 06/24/14 1845 06/25/14 0557  AST 167* 141*  ALT 268* 222*  ALKPHOS 265* 219*  BILITOT 3.6* 3.3*  PROT 7.9 6.3  ALBUMIN 3.5 2.8*    Recent Labs Lab 06/24/14 1845  LIPASE 34  AMYLASE 61   No results for input(s): AMMONIA in the last 168 hours. CBC:  Recent Labs Lab 06/24/14 1845 06/25/14 0557  WBC 10.2 7.0  NEUTROABS 5.6 3.4  HGB 12.9* 11.2*  HCT 39.9 35.3*  MCV 90.9 90.3  PLT 346 297   Cardiac Enzymes: No results for input(s): CKTOTAL, CKMB, CKMBINDEX, TROPONINI in the last 168 hours. BNP (last 3 results) No results for input(s): PROBNP in the last 8760 hours. CBG: No results  for input(s): GLUCAP in the last 168 hours.  No results found for this or any previous visit (from the past 240 hour(s)).   Studies: Ct Abdomen Pelvis W Contrast  06/24/2014   CLINICAL DATA:  Generalized abdominal pain x1 week, prior cholecystectomy on 03/27/2014, nausea  EXAM: CT ABDOMEN AND PELVIS WITH CONTRAST  TECHNIQUE: Multidetector CT imaging of the abdomen and pelvis was performed  using the standard protocol following bolus administration of intravenous contrast.  CONTRAST:  OMNIPAQUE IOHEXOL 300 MG/ML  SOLN  COMPARISON:  05/09/2014  FINDINGS: Lower chest:  Mild linear scarring at the lung bases.  Prominent vein draining into the coronary sinus.  Hepatobiliary: Liver is within normal limits.  Status post cholecystectomy. Residual 2.5 x 1.4 cm fluid collection in the gallbladder fossa, previously 5.5 x 7.2 cm. No intrahepatic or extrahepatic ductal dilatation.  Pancreas: Within normal limits.  Spleen: Within normal limits.  Adrenals/Urinary Tract: Adrenal glands are unremarkable.  4.1 cm cyst in the medial right upper kidney (series 2/ image 23). Left kidney is within normal limits. No hydronephrosis.  Bladder is within normal limits.  Stomach/Bowel: Stomach is unremarkable.  No evidence of bowel obstruction.  Normal appendix.  Extensive colonic diverticulosis, without evidence of diverticulitis.  Vascular/Lymphatic: No evidence of abdominal aortic aneurysm.  No suspicious abdominopelvic lymphadenopathy.  Reproductive: Prostate is unremarkable.  Other: No abdominopelvic ascites.  Small fat containing left inguinal hernia.  Musculoskeletal: Degenerative changes of the visualized thoracolumbar spine.  IMPRESSION: Status post cholecystectomy.  Residual 2.5 x 1.4 cm fluid collection in the gallbladder fossa, significantly improved.  No evidence of bowel obstruction.  Normal appendix.  Additional stable ancillary findings as above.   Electronically Signed   By: Charline Bills M.D.   On: 06/24/2014 21:15    Scheduled Meds: . aspirin EC  81 mg Oral QPM  . metoprolol succinate  25 mg Oral Daily  . pantoprazole  40 mg Oral Daily  . pravastatin  20 mg Oral QPM  . ramipril  10 mg Oral Daily  . sodium chloride  3 mL Intravenous Q12H   Continuous Infusions: . sodium chloride 125 mL/hr at 06/25/14 5456    Principal Problem:   Abdominal pain Active Problems:   Transaminitis    Cholelithiasis   Hypertension   Gastroesophageal reflux disease   Hyperlipidemia   Hyperbilirubinemia    Time spent: 35 minutes    Fredrik Mogel M.D. [facility] Pager [phone]. If 7PM-7AM, please contact night-coverage at [email or web address], password [facility] 06/25/2014, 10:28 AM  LOS: 1 day

## 2014-06-25 NOTE — Consult Note (Signed)
Patient seen, chart reviewed. Reviewed CT scan. Patient is status post laparoscopic cholecystectomy 03/28/2014. He did have a postoperative hematoma many weeks after the surgery secondary to Plavix. This was treated with percutaneous drainage. Currently presents with elevated liver enzyme tests, no dilated hepatobiliary tree seen on CT scan. Patient may have retained common bile duct stone. Agree with need for MRCP. No need for surgical intervention at this time. We will follow with you. Discussed with GI.

## 2014-06-25 NOTE — Progress Notes (Signed)
INITIAL NUTRITION ASSESSMENT  DOCUMENTATION CODES Per approved criteria  -Not Applicable   INTERVENTION: Follow diet advancment   NUTRITION DIAGNOSIS: Inadequate oral intake related to limited ability to tolerate usual quantity of foods/beverages consumed as evidenced by diet hx and 3% weight loss in past 43 days.    Goal: Pt will tolerate Soft diet to consume >90% of estimated nutrition needs  Monitor:  Diet advancement, percent of meals and supplements consumed, weight trends  Reason for Assessment: Malnutrition Screen   73 y.o. male  Admitting Dx: Abdominal pain  ASSESSMENT: Pt underweight laparoscopic cholecystectomy (03/28/14). His nutrition intake had been gradually improving until he says his appetite has just been poor for past 7-10 days. His food intake has been limited due to lack of interest he says, "I eat a few bites and I'm full".  Pt has unintentional weight loss 3% in 30 days, 5% in 75 days which is not significant.  Nutrition focused exam: no physical signs of fat mass depletion or muscle wasting.  Height: Ht Readings from Last 1 Encounters:  06/24/14 5\' 7"  (1.702 m)    Weight: Wt Readings from Last 1 Encounters:  06/25/14 179 lb 12.8 oz (81.557 kg)    Ideal Body Weight: 148# (67 kg)  % Ideal Body Weight: 122%  Wt Readings from Last 10 Encounters:  06/25/14 179 lb 12.8 oz (81.557 kg)  05/13/14 186 lb 4.6 oz (84.5 kg)  05/09/14 186 lb (84.369 kg)  04/09/14 190 lb (86.183 kg)  03/28/14 191 lb (86.637 kg)  03/24/14 191 lb (86.637 kg)  07/15/13 199 lb (90.266 kg)  07/25/12 199 lb (90.266 kg)  03/06/12 197 lb 3.2 oz (89.449 kg)  07/21/11 192 lb (87.091 kg)    Usual Body Weight: 190#  % Usual Body Weight: 95%  BMI:  Body mass index is 28.15 kg/(m^2). overweight  Estimated Nutritional Needs: Kcal: 2296-2460 Protein: 98-107 gr Fluid: 2.3-2.5 liters daily  Skin: intact  Diet Order: Diet NPO time specified Except for: Sips with  Meds  EDUCATION NEEDS: -No education needs identified at this time   Intake/Output Summary (Last 24 hours) at 06/25/14 1034 Last data filed at 06/25/14 1031  Gross per 24 hour  Intake      3 ml  Output    200 ml  Net   -197 ml    Last BM: 12/30   Labs:   Recent Labs Lab 06/24/14 1845 06/25/14 0557  NA 135 137  K 4.0 3.8  CL 107 109  CO2 22 22  BUN 14 10  CREATININE 0.96 0.95  CALCIUM 9.3 8.5  GLUCOSE 132* 90    CBG (last 3)  No results for input(s): GLUCAP in the last 72 hours.  Scheduled Meds: . aspirin EC  81 mg Oral QPM  . metoprolol succinate  25 mg Oral Daily  . pantoprazole  40 mg Oral Daily  . pravastatin  20 mg Oral QPM  . ramipril  10 mg Oral Daily  . sodium chloride  3 mL Intravenous Q12H    Continuous Infusions: . sodium chloride 125 mL/hr at 06/25/14 06/27/14    Past Medical History  Diagnosis Date  . Arteriosclerotic cardiovascular disease (ASCVD)     Stent to RI in 2004. Posterior MI in 4/07 with thrombosis of RI at stent site while off Plavix --> reintervention; do not d/c plavix  . Cerebrovascular disease     Left carotid bruit with 40-59% ICA stenosis; right carotid endarectomy in 2008  . Hypertension   .  Colonic polyp 1999    Adenoma resected in 1999; history of diverticulosis  . Gastroesophageal reflux disease     Dilation of esophagel stricture in 2008  . Hyperlipidemia   . Cholelithiasis 07/21/2009    Asymptomatic  . 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 Arthritis   . Coronary artery disease     Past Surgical History  Procedure Laterality Date  . Carotid endarterectomy  2008    Right  . Colonoscopy  2008    adenoma resected in 1999; no intervention in 2008  . Cholecystectomy N/A 03/28/2014    Procedure: LAPAROSCOPIC CHOLECYSTECTOMY;  Surgeon: Dalia Heading, MD;  Location: [facility];  Service: General;  Laterality: N/A;  . Cardiac catheterization      cardiac stent placed  . Colonoscopy with esophagogastroduodenoscopy (egd)  Feb 2010     Dr. Jena Gauss: mild erosive reflux esophagitis, non-critical Schatzki's ring without dilation, moderate sized hiatal hernia, bulbar erosions. Negative H.pylori serology. Colonoscopy with normal rectum, pancolonic diverticula, needs surveillance every 5 years due to history of adenomas.     Royann Shivers MS,RD,CSG,LDN Office: [phone] Pager: [phone]

## 2014-06-25 NOTE — Progress Notes (Signed)
UR completed 

## 2014-06-25 NOTE — Care Management Note (Signed)
    Page 1 of 1   06/25/2014     9:28:58 AM CARE MANAGEMENT NOTE 06/25/2014  Patient:  Kevin Reeves, Kevin Reeves   Account Number:  [id number]  Date Initiated:  06/25/2014  Documentation initiated by:  Kathyrn Sheriff  Subjective/Objective Assessment:   Pt admitted with abd pain. Pt is from home independent with self care, lives with wife. Pt plans to discharge home with self care. No CM needs identified.     Action/Plan:   Anticipated DC Date:  06/26/2014   Anticipated DC Plan:  HOME/SELF CARE      DC Planning Services  CM consult      Choice offered to / List presented to:             Status of service:  Completed, signed off Medicare Important Message given?   (If response is "NO", the following Medicare IM given date fields will be blank) Date Medicare IM given:   Medicare IM given by:   Date Additional Medicare IM given:   Additional Medicare IM given by:    Discharge Disposition:  HOME/SELF CARE  Per UR Regulation:    If discussed at Long Length of Stay Meetings, dates discussed:    Comments:  06/25/2014 0930 Kathyrn Sheriff, RN, MSN, [facility]

## 2014-06-26 ENCOUNTER — Telehealth: Payer: Self-pay | Admitting: Gastroenterology

## 2014-06-26 DIAGNOSIS — R945 Abnormal results of liver function studies: Principal | ICD-10-CM

## 2014-06-26 DIAGNOSIS — R1011 Right upper quadrant pain: Secondary | ICD-10-CM

## 2014-06-26 DIAGNOSIS — R7989 Other specified abnormal findings of blood chemistry: Secondary | ICD-10-CM

## 2014-06-26 LAB — CBC WITH DIFFERENTIAL/PLATELET
BASOS PCT: 1 % (ref 0–1)
Basophils Absolute: 0.1 10*3/uL (ref 0.0–0.1)
EOS ABS: 0.5 10*3/uL (ref 0.0–0.7)
Eosinophils Relative: 7 % — ABNORMAL HIGH (ref 0–5)
HCT: 36.5 % — ABNORMAL LOW (ref 39.0–52.0)
HEMOGLOBIN: 11.4 g/dL — AB (ref 13.0–17.0)
Lymphocytes Relative: 34 % (ref 12–46)
Lymphs Abs: 2.1 10*3/uL (ref 0.7–4.0)
MCH: 28.5 pg (ref 26.0–34.0)
MCHC: 31.2 g/dL (ref 30.0–36.0)
MCV: 91.3 fL (ref 78.0–100.0)
Monocytes Absolute: 0.9 10*3/uL (ref 0.1–1.0)
Monocytes Relative: 14 % — ABNORMAL HIGH (ref 3–12)
NEUTROS ABS: 2.8 10*3/uL (ref 1.7–7.7)
Neutrophils Relative %: 44 % (ref 43–77)
Platelets: 312 10*3/uL (ref 150–400)
RBC: 4 MIL/uL — ABNORMAL LOW (ref 4.22–5.81)
RDW: 15 % (ref 11.5–15.5)
WBC: 6.3 10*3/uL (ref 4.0–10.5)

## 2014-06-26 LAB — COMPREHENSIVE METABOLIC PANEL
ALBUMIN: 2.9 g/dL — AB (ref 3.5–5.2)
ALT: 214 U/L — ABNORMAL HIGH (ref 0–53)
AST: 137 U/L — AB (ref 0–37)
Alkaline Phosphatase: 218 U/L — ABNORMAL HIGH (ref 39–117)
Anion gap: 7 (ref 5–15)
BUN: 8 mg/dL (ref 6–23)
CHLORIDE: 110 meq/L (ref 96–112)
CO2: 22 mmol/L (ref 19–32)
CREATININE: 0.86 mg/dL (ref 0.50–1.35)
Calcium: 8.8 mg/dL (ref 8.4–10.5)
GFR calc non Af Amer: 84 mL/min — ABNORMAL LOW (ref >90)
Glucose, Bld: 91 mg/dL (ref 70–99)
POTASSIUM: 4 mmol/L (ref 3.5–5.1)
Sodium: 139 mmol/L (ref 135–145)
TOTAL PROTEIN: 6.4 g/dL (ref 6.0–8.3)
Total Bilirubin: 3.5 mg/dL — ABNORMAL HIGH (ref 0.3–1.2)

## 2014-06-26 MED ORDER — METOPROLOL SUCCINATE ER 25 MG PO TB24
12.5000 mg | ORAL_TABLET | Freq: Every day | ORAL | Status: DC
  Filled 2014-06-26 (×2): qty 1

## 2014-06-26 MED ORDER — RAMIPRIL 1.25 MG PO CAPS
2.5000 mg | ORAL_CAPSULE | Freq: Every day | ORAL | Status: DC
  Administered 2014-06-26: 2.5 mg via ORAL
  Filled 2014-06-26 (×2): qty 2

## 2014-06-26 NOTE — Progress Notes (Signed)
Subjective:  Feels much better today. Wants regular diet. Denies abdominal pain or nausea.   Objective: Vital signs in last 24 hours: Temp:  [97.7 F (36.5 C)-98.6 F (37 C)] 97.7 F (36.5 C) (12/31 0538) Pulse Rate:  [46-71] 46 (12/31 0538) BP: (96-114)/(56-59) 108/56 mmHg (12/31 0538) SpO2:  [97 %-99 %] 97 % (12/31 0538) Weight:  [179 lb (81.194 kg)] 179 lb (81.194 kg) (12/30 1140) Last BM Date: 06/25/14 General:   Alert,  Well-developed, well-nourished, pleasant and cooperative in NAD. Wife at bedside. Head:  Normocephalic and atraumatic. Eyes:  Sclera clear, no icterus.   Abdomen:  Soft, nontender and nondistended.  Normal bowel sounds, without guarding, and without rebound.   Extremities:  Without clubbing, deformity or edema. Neurologic:  Alert and  oriented x4;  grossly normal neurologically. Skin:  Intact without significant lesions or rashes. Psych:  Alert and cooperative. Normal mood and affect.  Intake/Output from previous day: 12/30 0701 - 12/31 0700 In: 3757.2 [I.V.:3757.2] Out: 975 [Urine:975] Intake/Output this shift: Total I/O In: -  Out: 650 [Urine:650]  Lab Results: CBC  Recent Labs  06/24/14 1845 06/25/14 0557 06/26/14 0543  WBC 10.2 7.0 6.3  HGB 12.9* 11.2* 11.4*  HCT 39.9 35.3* 36.5*  MCV 90.9 90.3 91.3  PLT 346 297 312   BMET  Recent Labs  06/24/14 1845 06/25/14 0557 06/26/14 0543  NA 135 137 139  K 4.0 3.8 4.0  CL 107 109 110  CO2 22 22 22   GLUCOSE 132* 90 91  BUN 14 10 8   CREATININE 0.96 0.95 0.86  CALCIUM 9.3 8.5 8.8   LFTs  Recent Labs  06/24/14 1845 06/24/14 2119 06/25/14 0557 06/26/14 0543  BILITOT 3.6*  --  3.3* 3.5*  BILIDIR  --  2.5*  --   --   ALKPHOS 265*  --  219* 218*  AST 167*  --  141* 137*  ALT 268*  --  222* 214*  PROT 7.9  --  6.3 6.4  ALBUMIN 3.5  --  2.8* 2.9*    Recent Labs  06/24/14 1845  LIPASE 34   PT/INR No results for input(s): LABPROT, INR in the last 72 hours.    Imaging  Studies: Ct Abdomen Pelvis W Contrast  06/24/2014   CLINICAL DATA:  Generalized abdominal pain x1 week, prior cholecystectomy on 03/27/2014, nausea  EXAM: CT ABDOMEN AND PELVIS WITH CONTRAST  TECHNIQUE: Multidetector CT imaging of the abdomen and pelvis was performed using the standard protocol following bolus administration of intravenous contrast.  CONTRAST:  06/26/2014 OMNIPAQUE IOHEXOL 300 MG/ML  SOLN  COMPARISON:  05/09/2014  FINDINGS: Lower chest:  Mild linear scarring at the lung bases.  Prominent vein draining into the coronary sinus.  Hepatobiliary: Liver is within normal limits.  Status post cholecystectomy. Residual 2.5 x 1.4 cm fluid collection in the gallbladder fossa, previously 5.5 x 7.2 cm. No intrahepatic or extrahepatic ductal dilatation.  Pancreas: Within normal limits.  Spleen: Within normal limits.  Adrenals/Urinary Tract: Adrenal glands are unremarkable.  4.1 cm cyst in the medial right upper kidney (series 2/ image 23). Left kidney is within normal limits. No hydronephrosis.  Bladder is within normal limits.  Stomach/Bowel: Stomach is unremarkable.  No evidence of bowel obstruction.  Normal appendix.  Extensive colonic diverticulosis, without evidence of diverticulitis.  Vascular/Lymphatic: No evidence of abdominal aortic aneurysm.  No suspicious abdominopelvic lymphadenopathy.  Reproductive: Prostate is unremarkable.  Other: No abdominopelvic ascites.  Small fat containing left inguinal hernia.  Musculoskeletal: Degenerative  changes of the visualized thoracolumbar spine.  IMPRESSION: Status post cholecystectomy.  Residual 2.5 x 1.4 cm fluid collection in the gallbladder fossa, significantly improved.  No evidence of bowel obstruction.  Normal appendix.  Additional stable ancillary findings as above.   Electronically Signed   By: Charline Bills M.D.   On: 06/24/2014 21:15   Mr 3d Recon At Scanner  06/25/2014   CLINICAL DATA:  Subsequent encounter for generalized abdominal pain for 1 week  duration with nausea and dark urine. Patient had cholecystectomy on 03/27/2014.  EXAM: MRI ABDOMEN WITHOUT AND WITH CONTRAST (INCLUDING MRCP)  TECHNIQUE: Multiplanar multisequence MR imaging of the abdomen was performed both before and after the administration of intravenous contrast. Heavily T2-weighted images of the biliary and pancreatic ducts were obtained, and three-dimensional MRCP images were rendered by post processing.  CONTRAST:  13mL MULTIHANCE GADOBENATE DIMEGLUMINE 529 MG/ML IV SOLN  COMPARISON:  CT scan from 06/24/2014 and 05/09/2014.  FINDINGS: Lower chest:  Left lower lobe subsegmental atelectasis is evident.  Hepatobiliary: No focal lesion within the liver parenchyma. Gallbladder is surgically absent. There is a tiny fluid collection in the gallbladder fossa measuring 1.2 x 1.4 cm. No rim enhancement to suggest superinfection at this time. No intra or extrahepatic biliary duct dilatation. There is mild gradual tapering of the right hepatic duct, just proximal to the confluence, and this may be secondary to the recent surgery as there is no dilatation of ducts proximal to this location.  Pancreas: No focal mass lesion. No dilatation of the main duct. No intraparenchymal cyst. No peripancreatic edema.  Spleen: No splenomegaly. No focal mass lesion.  Adrenals/Urinary Tract: No adrenal nodule or mass. 3.9 cm simple cyst identified in the upper pole the right kidney. There is a tiny 7 mm cyst in the lower pole of the right kidney. Left kidney is remarkable. No hydronephrosis or hydroureter.  Stomach/Bowel: Stomach is nondistended. No gastric wall thickening. No evidence of outlet obstruction. Duodenum is normally positioned as is the ligament of Treitz. Visualized small bowel loops are unremarkable.  Vascular/Lymphatic: No abdominal aortic aneurysm. No evidence for lymphadenopathy in the abdomen.  Other: Trace free fluid is seen adjacent to the right liver, stable since the CT scan.  Musculoskeletal: No  abnormal marrow signal within the visualized bony structures.  IMPRESSION: 1. Status post cholecystectomy with tiny simple appearing fluid collection in the gallbladder fossa. There is also a trace amount of fluid adjacent to the liver. 2. No MR findings to explain the patient's history of generalized abdominal pain. 3. No biliary dilatation.  No evidence for choledocholithiasis.   Electronically Signed   By: Kennith Center M.D.   On: 06/25/2014 12:41   Mr Roe Coombs W/wo Cm/mrcp  06/25/2014   CLINICAL DATA:  Subsequent encounter for generalized abdominal pain for 1 week duration with nausea and dark urine. Patient had cholecystectomy on 03/27/2014.  EXAM: MRI ABDOMEN WITHOUT AND WITH CONTRAST (INCLUDING MRCP)  TECHNIQUE: Multiplanar multisequence MR imaging of the abdomen was performed both before and after the administration of intravenous contrast. Heavily T2-weighted images of the biliary and pancreatic ducts were obtained, and three-dimensional MRCP images were rendered by post processing.  CONTRAST:  39mL MULTIHANCE GADOBENATE DIMEGLUMINE 529 MG/ML IV SOLN  COMPARISON:  CT scan from 06/24/2014 and 05/09/2014.  FINDINGS: Lower chest:  Left lower lobe subsegmental atelectasis is evident.  Hepatobiliary: No focal lesion within the liver parenchyma. Gallbladder is surgically absent. There is a tiny fluid collection in the gallbladder fossa measuring 1.2  x 1.4 cm. No rim enhancement to suggest superinfection at this time. No intra or extrahepatic biliary duct dilatation. There is mild gradual tapering of the right hepatic duct, just proximal to the confluence, and this may be secondary to the recent surgery as there is no dilatation of ducts proximal to this location.  Pancreas: No focal mass lesion. No dilatation of the main duct. No intraparenchymal cyst. No peripancreatic edema.  Spleen: No splenomegaly. No focal mass lesion.  Adrenals/Urinary Tract: No adrenal nodule or mass. 3.9 cm simple cyst identified in the  upper pole the right kidney. There is a tiny 7 mm cyst in the lower pole of the right kidney. Left kidney is remarkable. No hydronephrosis or hydroureter.  Stomach/Bowel: Stomach is nondistended. No gastric wall thickening. No evidence of outlet obstruction. Duodenum is normally positioned as is the ligament of Treitz. Visualized small bowel loops are unremarkable.  Vascular/Lymphatic: No abdominal aortic aneurysm. No evidence for lymphadenopathy in the abdomen.  Other: Trace free fluid is seen adjacent to the right liver, stable since the CT scan.  Musculoskeletal: No abnormal marrow signal within the visualized bony structures.  IMPRESSION: 1. Status post cholecystectomy with tiny simple appearing fluid collection in the gallbladder fossa. There is also a trace amount of fluid adjacent to the liver. 2. No MR findings to explain the patient's history of generalized abdominal pain. 3. No biliary dilatation.  No evidence for choledocholithiasis.   Electronically Signed   By: Kennith Center M.D.   On: 06/25/2014 12:41  [2 weeks]   Assessment: 73 year old male admitted with fatigue, RUQ abdominal pressure, and elevated LFTs with concern for possible microlithiasis, retained CBD stone. Underwent laparoscopic cholecystectomy in Oct 2015 with subsequent postoperative hematoma in the setting of Plavix, requiring JP drain; this has since resolved and CT shows improvement in residual fluid collection. No evidence of CBD dilatation on CT but LFT pattern concerning. Acute hepatitis panel negative. MRCP "with no evidence of CBD stone or biliary dilation. There is mild gradual tapering of the right hepatic duct, just proximal to the confluence, and this may be secondary to the recent surgery as there is no dilatation of ducts proximal to this location."  Unclear significance. Clinically patient much improved. LFTs remain unchanged. ?passed a stone +/- cholestasis related to surgery/hematoma/etc.  As separate issue: due  for surveillance colonoscopy now. Will arrange as outpatient. No concerning lower GI symptoms.   Plan: 1. LFTs in am. 2. Agree with advancing diet as done. 3. D/C statin for now at pharmacy suggestion. 4. Outpatient colonoscopy for surveillance with h/o polyps.  5. Consider resuming plavix once LFTs start trending downward.  Leanna Battles. [facility] 12/31/20151:55 PM    LOS: 2 days    Attending note:  Pt seen and examined.  Discussed with Dr.Jenkins earlier today.   Agree with above assessment and recommendations as outlined above.

## 2014-06-26 NOTE — Progress Notes (Signed)
UR completed 

## 2014-06-26 NOTE — Telephone Encounter (Signed)
Patient will need LFTs next week if still elevated at time of discharge. He needs hospital follow up (surveillance TCS/abnormal LFTs) in 6 to 8 weeks.

## 2014-06-26 NOTE — Progress Notes (Signed)
MRCP negative for choledocholithiasis. The patient possibly has passed a stone. GI input appreciated.

## 2014-06-26 NOTE — Progress Notes (Signed)
[facility] PROGRESS NOTE  Kevin Reeves JQZ:[id number] DOB: 06-Aug-1940 DOA: 06/24/2014 PCP: Cassell Smiles., MD  Assessment/Plan: #1 abdominal pain/transaminitis/hyperbilirubinemia Questionable etiology. Clinical improvement, however patient jaundiced. Concern for retained vs passed common bile duct stone. Patient is status post laparoscopic cholecystectomy October 2015. Patient with abdominal pain November 2015 with fluid collection nor noted in the subhepatic space. HIDA scan at that time was negative. Patient status post percutaneous drainage with JP drain IR now removed November 2015. Patient presented with abdominal pain noted to have a transaminitis. CT scan shows residual fluid collection gallbladder fossa has improved. Acute hepatitis panel is negative. LFTs are trending down. MRCP is negative for biliary dilatation and no evidence of retained stone. Per GI bump patient's LFTs may be secondary to a passed stone versus coexisting bowel leak which spontaneously sealed off. No need for ERCP at this time per GI. Patient tolerating clear liquids. Will advance patient to full liquid diet. GI following and general surgery following and appreciate input and recommendations.  #2 coronary artery disease Stable. Patient denies any chest pain. Patient currently on aspirin. Plavix was held as patient was noted to have a hematoma status post cholecystectomy. Continue to hold Plavix for now in anticipation of possible ERCP per GI. GI to advice when Plavix may be resumed.  #3 hypertension Patient's blood pressure borderline with systolics in the high 90s. Patient currently asymptomatic. Decrease metoprolol to 12.5mg  daily. Will decrease ramipril to 2.5 mg daily. Follow.  #4 gastroesophageal reflux disease PPI.  #5 hyperlipidemia Continue Pravachol.  #6 prophylaxis PPI for GI prophylaxis. SCDs for DVT prophylaxis.   Code Status: Full Family Communication: Updated patient and wife at  bedside. Disposition Plan: Remain inpatient   Consultants:  General surgery. Dr. Lovell Sheehan 06/25/2014  Gastroenterology: Dr Jena Gauss 06/25/2014  Procedures:  CT abdomen and pelvis 06/24/2014  MRCP 06/25/14  Antibiotics:  None  HPI/Subjective: Patient states abdominal pain has improved. Patient denies any nausea, no vomiting, no current abdominal pain. No chest pain. No shortness of breath. Patient tolerating clear liquids.  Objective: Filed Vitals:   06/26/14 0538  BP: 108/56  Pulse: 46  Temp: 97.7 F (36.5 C)  Resp:     Intake/Output Summary (Last 24 hours) at 06/26/14 0957 Last data filed at 06/26/14 0800  Gross per 24 hour  Intake 3754.17 ml  Output   1625 ml  Net 2129.17 ml   Filed Weights   06/24/14 1551 06/24/14 2324 06/25/14 0700  Weight: 79.833 kg (176 lb) 79.833 kg (176 lb) 81.557 kg (179 lb 12.8 oz)    Exam:   General:  NAD. Jaundiced.  Cardiovascular: RRR  Respiratory: CTAB  Abdomen: Soft, nontender, nondistended, positive bowel sounds.  Musculoskeletal: No clubbing cyanosis or edema.  Data Reviewed: Basic Metabolic Panel:  Recent Labs Lab 06/24/14 1845 06/25/14 0557 06/26/14 0543  NA 135 137 139  K 4.0 3.8 4.0  CL 107 109 110  CO2 22 22 22   GLUCOSE 132* 90 91  BUN 14 10 8   CREATININE 0.96 0.95 0.86  CALCIUM 9.3 8.5 8.8   Liver Function Tests:  Recent Labs Lab 06/24/14 1845 06/25/14 0557 06/26/14 0543  AST 167* 141* 137*  ALT 268* 222* 214*  ALKPHOS 265* 219* 218*  BILITOT 3.6* 3.3* 3.5*  PROT 7.9 6.3 6.4  ALBUMIN 3.5 2.8* 2.9*    Recent Labs Lab 06/24/14 1845  LIPASE 34  AMYLASE 61   No results for input(s): AMMONIA in the last 168 hours. CBC:  Recent Labs Lab  06/24/14 1845 06/25/14 0557 06/26/14 0543  WBC 10.2 7.0 6.3  NEUTROABS 5.6 3.4 2.8  HGB 12.9* 11.2* 11.4*  HCT 39.9 35.3* 36.5*  MCV 90.9 90.3 91.3  PLT 346 297 312   Cardiac Enzymes: No results for input(s): CKTOTAL, CKMB, CKMBINDEX,  TROPONINI in the last 168 hours. BNP (last 3 results) No results for input(s): PROBNP in the last 8760 hours. CBG: No results for input(s): GLUCAP in the last 168 hours.  No results found for this or any previous visit (from the past 240 hour(s)).   Studies: Ct Abdomen Pelvis W Contrast  06/24/2014   CLINICAL DATA:  Generalized abdominal pain x1 week, prior cholecystectomy on 03/27/2014, nausea  EXAM: CT ABDOMEN AND PELVIS WITH CONTRAST  TECHNIQUE: Multidetector CT imaging of the abdomen and pelvis was performed using the standard protocol following bolus administration of intravenous contrast.  CONTRAST:  OMNIPAQUE IOHEXOL 300 MG/ML  SOLN  COMPARISON:  05/09/2014  FINDINGS: Lower chest:  Mild linear scarring at the lung bases.  Prominent vein draining into the coronary sinus.  Hepatobiliary: Liver is within normal limits.  Status post cholecystectomy. Residual 2.5 x 1.4 cm fluid collection in the gallbladder fossa, previously 5.5 x 7.2 cm. No intrahepatic or extrahepatic ductal dilatation.  Pancreas: Within normal limits.  Spleen: Within normal limits.  Adrenals/Urinary Tract: Adrenal glands are unremarkable.  4.1 cm cyst in the medial right upper kidney (series 2/ image 23). Left kidney is within normal limits. No hydronephrosis.  Bladder is within normal limits.  Stomach/Bowel: Stomach is unremarkable.  No evidence of bowel obstruction.  Normal appendix.  Extensive colonic diverticulosis, without evidence of diverticulitis.  Vascular/Lymphatic: No evidence of abdominal aortic aneurysm.  No suspicious abdominopelvic lymphadenopathy.  Reproductive: Prostate is unremarkable.  Other: No abdominopelvic ascites.  Small fat containing left inguinal hernia.  Musculoskeletal: Degenerative changes of the visualized thoracolumbar spine.  IMPRESSION: Status post cholecystectomy.  Residual 2.5 x 1.4 cm fluid collection in the gallbladder fossa, significantly improved.  No evidence of bowel obstruction.   Normal appendix.  Additional stable ancillary findings as above.   Electronically Signed   By: Charline Bills M.D.   On: 06/24/2014 21:15   Mr 3d Recon At Scanner  06/25/2014   CLINICAL DATA:  Subsequent encounter for generalized abdominal pain for 1 week duration with nausea and dark urine. Patient had cholecystectomy on 03/27/2014.  EXAM: MRI ABDOMEN WITHOUT AND WITH CONTRAST (INCLUDING MRCP)  TECHNIQUE: Multiplanar multisequence MR imaging of the abdomen was performed both before and after the administration of intravenous contrast. Heavily T2-weighted images of the biliary and pancreatic ducts were obtained, and three-dimensional MRCP images were rendered by post processing.  CONTRAST:  45mL MULTIHANCE GADOBENATE DIMEGLUMINE 529 MG/ML IV SOLN  COMPARISON:  CT scan from 06/24/2014 and 05/09/2014.  FINDINGS: Lower chest:  Left lower lobe subsegmental atelectasis is evident.  Hepatobiliary: No focal lesion within the liver parenchyma. Gallbladder is surgically absent. There is a tiny fluid collection in the gallbladder fossa measuring 1.2 x 1.4 cm. No rim enhancement to suggest superinfection at this time. No intra or extrahepatic biliary duct dilatation. There is mild gradual tapering of the right hepatic duct, just proximal to the confluence, and this may be secondary to the recent surgery as there is no dilatation of ducts proximal to this location.  Pancreas: No focal mass lesion. No dilatation of the main duct. No intraparenchymal cyst. No peripancreatic edema.  Spleen: No splenomegaly. No focal mass lesion.  Adrenals/Urinary Tract: No adrenal  nodule or mass. 3.9 cm simple cyst identified in the upper pole the right kidney. There is a tiny 7 mm cyst in the lower pole of the right kidney. Left kidney is remarkable. No hydronephrosis or hydroureter.  Stomach/Bowel: Stomach is nondistended. No gastric wall thickening. No evidence of outlet obstruction. Duodenum is normally positioned as is the ligament of  Treitz. Visualized small bowel loops are unremarkable.  Vascular/Lymphatic: No abdominal aortic aneurysm. No evidence for lymphadenopathy in the abdomen.  Other: Trace free fluid is seen adjacent to the right liver, stable since the CT scan.  Musculoskeletal: No abnormal marrow signal within the visualized bony structures.  IMPRESSION: 1. Status post cholecystectomy with tiny simple appearing fluid collection in the gallbladder fossa. There is also a trace amount of fluid adjacent to the liver. 2. No MR findings to explain the patient's history of generalized abdominal pain. 3. No biliary dilatation.  No evidence for choledocholithiasis.   Electronically Signed   By: Kennith Center M.D.   On: 06/25/2014 12:41   Mr Roe Coombs W/wo Cm/mrcp  06/25/2014   CLINICAL DATA:  Subsequent encounter for generalized abdominal pain for 1 week duration with nausea and dark urine. Patient had cholecystectomy on 03/27/2014.  EXAM: MRI ABDOMEN WITHOUT AND WITH CONTRAST (INCLUDING MRCP)  TECHNIQUE: Multiplanar multisequence MR imaging of the abdomen was performed both before and after the administration of intravenous contrast. Heavily T2-weighted images of the biliary and pancreatic ducts were obtained, and three-dimensional MRCP images were rendered by post processing.  CONTRAST:  15mL MULTIHANCE GADOBENATE DIMEGLUMINE 529 MG/ML IV SOLN  COMPARISON:  CT scan from 06/24/2014 and 05/09/2014.  FINDINGS: Lower chest:  Left lower lobe subsegmental atelectasis is evident.  Hepatobiliary: No focal lesion within the liver parenchyma. Gallbladder is surgically absent. There is a tiny fluid collection in the gallbladder fossa measuring 1.2 x 1.4 cm. No rim enhancement to suggest superinfection at this time. No intra or extrahepatic biliary duct dilatation. There is mild gradual tapering of the right hepatic duct, just proximal to the confluence, and this may be secondary to the recent surgery as there is no dilatation of ducts proximal to this  location.  Pancreas: No focal mass lesion. No dilatation of the main duct. No intraparenchymal cyst. No peripancreatic edema.  Spleen: No splenomegaly. No focal mass lesion.  Adrenals/Urinary Tract: No adrenal nodule or mass. 3.9 cm simple cyst identified in the upper pole the right kidney. There is a tiny 7 mm cyst in the lower pole of the right kidney. Left kidney is remarkable. No hydronephrosis or hydroureter.  Stomach/Bowel: Stomach is nondistended. No gastric wall thickening. No evidence of outlet obstruction. Duodenum is normally positioned as is the ligament of Treitz. Visualized small bowel loops are unremarkable.  Vascular/Lymphatic: No abdominal aortic aneurysm. No evidence for lymphadenopathy in the abdomen.  Other: Trace free fluid is seen adjacent to the right liver, stable since the CT scan.  Musculoskeletal: No abnormal marrow signal within the visualized bony structures.  IMPRESSION: 1. Status post cholecystectomy with tiny simple appearing fluid collection in the gallbladder fossa. There is also a trace amount of fluid adjacent to the liver. 2. No MR findings to explain the patient's history of generalized abdominal pain. 3. No biliary dilatation.  No evidence for choledocholithiasis.   Electronically Signed   By: Kennith Center M.D.   On: 06/25/2014 12:41    Scheduled Meds: . aspirin EC  81 mg Oral QPM  . metoprolol succinate  12.5 mg Oral Daily  .  pantoprazole  40 mg Oral Daily  . pravastatin  20 mg Oral QPM  . ramipril  2.5 mg Oral Daily  . sodium chloride  3 mL Intravenous Q12H   Continuous Infusions: . sodium chloride 75 mL/hr at 06/26/14 3212    Principal Problem:   Abdominal pain Active Problems:   Transaminitis   Cholelithiasis   Hypertension   Gastroesophageal reflux disease   Hyperlipidemia   Hyperbilirubinemia    Time spent: 35 minutes    [place] Basham M.D. [facility] Pager [phone]. If 7PM-7AM, please contact night-coverage at [email or web address],  password [facility] 06/26/2014, 9:57 AM  LOS: 2 days

## 2014-06-27 LAB — CBC WITH DIFFERENTIAL/PLATELET
BASOS ABS: 0 10*3/uL (ref 0.0–0.1)
Basophils Relative: 1 % (ref 0–1)
EOS PCT: 7 % — AB (ref 0–5)
Eosinophils Absolute: 0.4 10*3/uL (ref 0.0–0.7)
HCT: 34.1 % — ABNORMAL LOW (ref 39.0–52.0)
HEMOGLOBIN: 11.1 g/dL — AB (ref 13.0–17.0)
LYMPHS PCT: 34 % (ref 12–46)
Lymphs Abs: 2.1 10*3/uL (ref 0.7–4.0)
MCH: 29.3 pg (ref 26.0–34.0)
MCHC: 32.6 g/dL (ref 30.0–36.0)
MCV: 90 fL (ref 78.0–100.0)
MONO ABS: 0.7 10*3/uL (ref 0.1–1.0)
Monocytes Relative: 10 % (ref 3–12)
NEUTROS ABS: 3 10*3/uL (ref 1.7–7.7)
Neutrophils Relative %: 48 % (ref 43–77)
PLATELETS: 308 10*3/uL (ref 150–400)
RBC: 3.79 MIL/uL — ABNORMAL LOW (ref 4.22–5.81)
RDW: 15.3 % (ref 11.5–15.5)
WBC: 6.2 10*3/uL (ref 4.0–10.5)

## 2014-06-27 LAB — COMPREHENSIVE METABOLIC PANEL
ALBUMIN: 2.6 g/dL — AB (ref 3.5–5.2)
ALK PHOS: 211 U/L — AB (ref 39–117)
ALT: 176 U/L — AB (ref 0–53)
ANION GAP: 4 — AB (ref 5–15)
AST: 115 U/L — ABNORMAL HIGH (ref 0–37)
BILIRUBIN TOTAL: 2.4 mg/dL — AB (ref 0.3–1.2)
BUN: 8 mg/dL (ref 6–23)
CALCIUM: 8.5 mg/dL (ref 8.4–10.5)
CO2: 25 mmol/L (ref 19–32)
Chloride: 112 mEq/L (ref 96–112)
Creatinine, Ser: 0.95 mg/dL (ref 0.50–1.35)
GFR calc Af Amer: >90 mL/min (ref >90)
GFR calc non Af Amer: 81 mL/min — ABNORMAL LOW (ref >90)
Glucose, Bld: 120 mg/dL — ABNORMAL HIGH (ref 70–99)
Potassium: 3.7 mmol/L (ref 3.5–5.1)
Sodium: 141 mmol/L (ref 135–145)
Total Protein: 5.9 g/dL — ABNORMAL LOW (ref 6.0–8.3)

## 2014-06-27 NOTE — Progress Notes (Signed)
Patient being discharged home today with wife.  Patient was given discharge instructions and care notes.  Patient verbalized understanding with no complaints or concerns voiced at this time.  IV was removed with catheter intact, no bleeding or complications.  Patient left unit, ambulating, in stable condition with a staff member.

## 2014-06-27 NOTE — Discharge Summary (Signed)
Discharge Summary  Kevin Reeves:[id number] DOB: 12-Nov-1940  PCP: Cassell Smiles., MD  Admit date: 06/24/2014 Discharge date: 06/27/2014  Time spent: 25 minutes  Recommendations for Outpatient Follow-up:  1. Patient will follow up with GI next week for repeat liver function test check   Discharge Diagnoses:  Active Hospital Problems   Diagnosis Date Noted  . Abdominal pain 06/24/2014  . Transaminitis 06/24/2014  . Hyperbilirubinemia 06/24/2014  . Hypertension   . Gastroesophageal reflux disease   . Hyperlipidemia   . Cholelithiasis 07/21/2009    Resolved Hospital Problems   Diagnosis Date Noted Date Resolved  . Elevated LFTs  06/25/2014    Discharge Condition: Improved, being discharged home  Diet recommendation: Low-sodium  Filed Weights   06/24/14 1551 06/24/14 2324 06/25/14 0700  Weight: 79.833 kg (176 lb) 79.833 kg (176 lb) 81.557 kg (179 lb 12.8 oz)    History of present illness:  74 year old male with past history of CAD and status post cholecystectomy last year admitted to the hospitalist service on 12/29 after having one week of persistent nausea and right upper quadrant pain and found to have mildly elevated transaminases and alkaline phosphatase of 2 6500 to be bilirubin of 3.6 concerning for common bile duct impaction.  Hospital Course:  Principal Problem:   Abdominal pain/transaminitis/hyperbilirubinemia: Patient seen by general surgery and gastroenterology. Acute hepatitis panel negative. MRCP no snow evidence of common bile duct stone or biliary dilatation. There is a? With the patient had recently passed a stone. He was kept nothing by mouth and over the next few days, labs continue to trend down. By day of discharge, bilirubin down to 2.5 and patient's diet was advanced was able tolerate solid food. She'll to be medically stable and will be discharged home with follow-up in the next few weeks with GI for repeat liver function tests Active  Problems:    Hypertension: Stable continue on his home meds   Gastroesophageal reflux disease   Hyperlipidemia Overweight: Patient meets criteria with BMI greater than 25  Procedures:  None  Consultations:  GI  Gen. surgery  Discharge Exam: BP 99/67 mmHg  Pulse 74  Temp(Src) 97.8 F (36.6 C) (Oral)  Resp 18  Ht  (1.702 m)  Wt 81.557 kg (179 lb 12.8 oz)  BMI 28.15 kg/m2  SpO2 100%  General: Alert and oriented 3, no acute distress Cardiovascular: Regular rate and rhythm, S1-S2 Respiratory: Clear to auscultation bilaterally  Discharge Instructions You were cared for by a hospitalist during your hospital stay. If you have any questions about your discharge medications or the care you received while you were in the hospital after you are discharged, you can call the unit and asked to speak with the hospitalist on call if the hospitalist that took care of you is not available. Once you are discharged, your primary care physician will handle any further medical issues. Please note that NO REFILLS for any discharge medications will be authorized once you are discharged, as it is imperative that you return to your primary care physician (or establish a relationship with a primary care physician if you do not have one) for your aftercare needs so that they can reassess your need for medications and monitor your lab values.  Discharge Instructions    Diet - low sodium heart healthy    Complete by:  As directed      Increase activity slowly    Complete by:  As directed  Medication List    STOP taking these medications        ciprofloxacin 500 MG tablet  Commonly known as:  CIPRO     metroNIDAZOLE 500 MG tablet  Commonly known as:  FLAGYL     oxyCODONE-acetaminophen 5-325 MG per tablet  Commonly known as:  PERCOCET/ROXICET      TAKE these medications        aspirin EC 81 MG tablet  Take 81 mg by mouth every evening.     metoprolol succinate 25 MG 24  hr tablet  Commonly known as:  TOPROL-XL  Take 25 mg by mouth daily.     multivitamin tablet  Take 1 tablet by mouth daily.     NITROSTAT 0.4 MG SL tablet  Generic drug:  nitroGLYCERIN  DISSOLVE 1 TABLET UNDER  TONGUE EVERY 5 MINUTES UPTO 15 MIN FOR CHEST PAIN.IF NO RELIEF CALL 911.     omeprazole 20 MG capsule  Commonly known as:  PRILOSEC  Take 20 mg by mouth daily.     pravastatin 20 MG tablet  Commonly known as:  PRAVACHOL  Take 1 tablet (20 mg total) by mouth every evening.     ramipril 10 MG capsule  Commonly known as:  ALTACE  Take 10 mg by mouth daily.       No Known Allergies     Follow-up Information    Follow up with Cassell Smiles., MD In 1 month.   Specialty:  Internal Medicine   Why:  As needed   Contact information:   [address] [place] [address] [phone]       Follow up with Eula Listen, MD.   Specialty:  Gastroenterology   Why:  Next week for Liver function test followup   Contact information:   [address] [place] [address] [phone]        The results of significant diagnostics from this hospitalization (including imaging, microbiology, ancillary and laboratory) are listed below for reference.    Significant Diagnostic Studies: Ct Abdomen Pelvis W Contrast  06/24/2014   CLINICAL DATA:  Generalized abdominal pain x1 week, prior cholecystectomy on 03/27/2014, nausea  EXAM: CT ABDOMEN AND PELVIS WITH CONTRAST  TECHNIQUE: Multidetector CT imaging of the abdomen and pelvis was performed using the standard protocol following bolus administration of intravenous contrast.  CONTRAST:  OMNIPAQUE IOHEXOL 300 MG/ML  SOLN  COMPARISON:  05/09/2014  FINDINGS: Lower chest:  Mild linear scarring at the lung bases.  Prominent vein draining into the coronary sinus.  Hepatobiliary: Liver is within normal limits.  Status post cholecystectomy. Residual 2.5 x 1.4 cm fluid collection in the gallbladder fossa, previously 5.5 x 7.2  cm. No intrahepatic or extrahepatic ductal dilatation.  Pancreas: Within normal limits.  Spleen: Within normal limits.  Adrenals/Urinary Tract: Adrenal glands are unremarkable.  4.1 cm cyst in the medial right upper kidney (series 2/ image 23). Left kidney is within normal limits. No hydronephrosis.  Bladder is within normal limits.  Stomach/Bowel: Stomach is unremarkable.  No evidence of bowel obstruction.  Normal appendix.  Extensive colonic diverticulosis, without evidence of diverticulitis.  Vascular/Lymphatic: No evidence of abdominal aortic aneurysm.  No suspicious abdominopelvic lymphadenopathy.  Reproductive: Prostate is unremarkable.  Other: No abdominopelvic ascites.  Small fat containing left inguinal hernia.  Musculoskeletal: Degenerative changes of the visualized thoracolumbar spine.  IMPRESSION: Status post cholecystectomy.  Residual 2.5 x 1.4 cm fluid collection in the gallbladder fossa, significantly improved.  No evidence of bowel obstruction.  Normal  appendix.  Additional stable ancillary findings as above.   Electronically Signed   By: Charline Bills M.D.   On: 06/24/2014 21:15   Mr 3d Recon At Scanner  06/25/2014   CLINICAL DATA:  Subsequent encounter for generalized abdominal pain for 1 week duration with nausea and dark urine. Patient had cholecystectomy on 03/27/2014.  EXAM: MRI ABDOMEN WITHOUT AND WITH CONTRAST (INCLUDING MRCP)  TECHNIQUE: Multiplanar multisequence MR imaging of the abdomen was performed both before and after the administration of intravenous contrast. Heavily T2-weighted images of the biliary and pancreatic ducts were obtained, and three-dimensional MRCP images were rendered by post processing.  CONTRAST:  68mL MULTIHANCE GADOBENATE DIMEGLUMINE 529 MG/ML IV SOLN  COMPARISON:  CT scan from 06/24/2014 and 05/09/2014.  FINDINGS: Lower chest:  Left lower lobe subsegmental atelectasis is evident.  Hepatobiliary: No focal lesion within the liver parenchyma. Gallbladder is  surgically absent. There is a tiny fluid collection in the gallbladder fossa measuring 1.2 x 1.4 cm. No rim enhancement to suggest superinfection at this time. No intra or extrahepatic biliary duct dilatation. There is mild gradual tapering of the right hepatic duct, just proximal to the confluence, and this may be secondary to the recent surgery as there is no dilatation of ducts proximal to this location.  Pancreas: No focal mass lesion. No dilatation of the main duct. No intraparenchymal cyst. No peripancreatic edema.  Spleen: No splenomegaly. No focal mass lesion.  Adrenals/Urinary Tract: No adrenal nodule or mass. 3.9 cm simple cyst identified in the upper pole the right kidney. There is a tiny 7 mm cyst in the lower pole of the right kidney. Left kidney is remarkable. No hydronephrosis or hydroureter.  Stomach/Bowel: Stomach is nondistended. No gastric wall thickening. No evidence of outlet obstruction. Duodenum is normally positioned as is the ligament of Treitz. Visualized small bowel loops are unremarkable.  Vascular/Lymphatic: No abdominal aortic aneurysm. No evidence for lymphadenopathy in the abdomen.  Other: Trace free fluid is seen adjacent to the right liver, stable since the CT scan.  Musculoskeletal: No abnormal marrow signal within the visualized bony structures.  IMPRESSION: 1. Status post cholecystectomy with tiny simple appearing fluid collection in the gallbladder fossa. There is also a trace amount of fluid adjacent to the liver. 2. No MR findings to explain the patient's history of generalized abdominal pain. 3. No biliary dilatation.  No evidence for choledocholithiasis.   Electronically Signed   By: Kennith Center M.D.   On: 06/25/2014 12:41   Mr Roe Coombs W/wo Cm/mrcp  06/25/2014   CLINICAL DATA:  Subsequent encounter for generalized abdominal pain for 1 week duration with nausea and dark urine. Patient had cholecystectomy on 03/27/2014.  EXAM: MRI ABDOMEN WITHOUT AND WITH CONTRAST (INCLUDING  MRCP)  TECHNIQUE: Multiplanar multisequence MR imaging of the abdomen was performed both before and after the administration of intravenous contrast. Heavily T2-weighted images of the biliary and pancreatic ducts were obtained, and three-dimensional MRCP images were rendered by post processing.  CONTRAST:  34mL MULTIHANCE GADOBENATE DIMEGLUMINE 529 MG/ML IV SOLN  COMPARISON:  CT scan from 06/24/2014 and 05/09/2014.  FINDINGS: Lower chest:  Left lower lobe subsegmental atelectasis is evident.  Hepatobiliary: No focal lesion within the liver parenchyma. Gallbladder is surgically absent. There is a tiny fluid collection in the gallbladder fossa measuring 1.2 x 1.4 cm. No rim enhancement to suggest superinfection at this time. No intra or extrahepatic biliary duct dilatation. There is mild gradual tapering of the right hepatic duct, just proximal to the  confluence, and this may be secondary to the recent surgery as there is no dilatation of ducts proximal to this location.  Pancreas: No focal mass lesion. No dilatation of the main duct. No intraparenchymal cyst. No peripancreatic edema.  Spleen: No splenomegaly. No focal mass lesion.  Adrenals/Urinary Tract: No adrenal nodule or mass. 3.9 cm simple cyst identified in the upper pole the right kidney. There is a tiny 7 mm cyst in the lower pole of the right kidney. Left kidney is remarkable. No hydronephrosis or hydroureter.  Stomach/Bowel: Stomach is nondistended. No gastric wall thickening. No evidence of outlet obstruction. Duodenum is normally positioned as is the ligament of Treitz. Visualized small bowel loops are unremarkable.  Vascular/Lymphatic: No abdominal aortic aneurysm. No evidence for lymphadenopathy in the abdomen.  Other: Trace free fluid is seen adjacent to the right liver, stable since the CT scan.  Musculoskeletal: No abnormal marrow signal within the visualized bony structures.  IMPRESSION: 1. Status post cholecystectomy with tiny simple appearing  fluid collection in the gallbladder fossa. There is also a trace amount of fluid adjacent to the liver. 2. No MR findings to explain the patient's history of generalized abdominal pain. 3. No biliary dilatation.  No evidence for choledocholithiasis.   Electronically Signed   By: Kennith Center M.D.   On: 06/25/2014 12:41    Microbiology: No results found for this or any previous visit (from the past 240 hour(s)).   Labs: Basic Metabolic Panel:  Recent Labs Lab 06/24/14 1845 06/25/14 0557 06/26/14 0543 06/27/14 0622  NA 135 137 139 141  K 4.0 3.8 4.0 3.7  CL 107 109 110 112  CO2 22 22 22 25   GLUCOSE 132* 90 91 120*  BUN 14 10 8 8   CREATININE 0.96 0.95 0.86 0.95  CALCIUM 9.3 8.5 8.8 8.5   Liver Function Tests:  Recent Labs Lab 06/24/14 1845 06/25/14 0557 06/26/14 0543 06/27/14 0622  AST 167* 141* 137* 115*  ALT 268* 222* 214* 176*  ALKPHOS 265* 219* 218* 211*  BILITOT 3.6* 3.3* 3.5* 2.4*  PROT 7.9 6.3 6.4 5.9*  ALBUMIN 3.5 2.8* 2.9* 2.6*    Recent Labs Lab 06/24/14 1845  LIPASE 34  AMYLASE 61   No results for input(s): AMMONIA in the last 168 hours. CBC:  Recent Labs Lab 06/24/14 1845 06/25/14 0557 06/26/14 0543 06/27/14 0622  WBC 10.2 7.0 6.3 6.2  NEUTROABS 5.6 3.4 2.8 3.0  HGB 12.9* 11.2* 11.4* 11.1*  HCT 39.9 35.3* 36.5* 34.1*  MCV 90.9 90.3 91.3 90.0  PLT 346 297 312 308   Cardiac Enzymes: No results for input(s): CKTOTAL, CKMB, CKMBINDEX, TROPONINI in the last 168 hours. BNP: BNP (last 3 results) No results for input(s): PROBNP in the last 8760 hours. CBG: No results for input(s): GLUCAP in the last 168 hours.     Signed:  06/28/14  [facility] 06/27/2014, 3:28 PM

## 2014-06-27 NOTE — Discharge Instructions (Signed)
Restart your blood pressure medications on Monday 06/30/13

## 2014-06-30 ENCOUNTER — Other Ambulatory Visit: Payer: Self-pay

## 2014-06-30 ENCOUNTER — Encounter: Payer: Self-pay | Admitting: Internal Medicine

## 2014-06-30 DIAGNOSIS — R945 Abnormal results of liver function studies: Principal | ICD-10-CM

## 2014-06-30 DIAGNOSIS — R7989 Other specified abnormal findings of blood chemistry: Secondary | ICD-10-CM

## 2014-06-30 NOTE — Telephone Encounter (Signed)
Talked to patient and he will go have the blood work done in the morning. Orders faxed to the lab.

## 2014-07-02 LAB — HEPATIC FUNCTION PANEL
ALT: 96 U/L — AB (ref 0–53)
AST: 50 U/L — ABNORMAL HIGH (ref 0–37)
Albumin: 3.5 g/dL (ref 3.5–5.2)
Alkaline Phosphatase: 270 U/L — ABNORMAL HIGH (ref 39–117)
Bilirubin, Direct: 0.6 mg/dL — ABNORMAL HIGH (ref 0.0–0.3)
Indirect Bilirubin: 0.8 mg/dL (ref 0.2–1.2)
Total Bilirubin: 1.4 mg/dL — ABNORMAL HIGH (ref 0.2–1.2)
Total Protein: 6.9 g/dL (ref 6.0–8.3)

## 2014-07-02 NOTE — Progress Notes (Signed)
Quick Note:  LFTs with some improvement.  Please ask patient if he restarted his prastatin after discharge from hospital. Would repeat LFTs in 2 weeks. ______

## 2014-07-03 ENCOUNTER — Other Ambulatory Visit: Payer: Self-pay | Admitting: Gastroenterology

## 2014-07-03 ENCOUNTER — Other Ambulatory Visit: Payer: Self-pay

## 2014-07-03 DIAGNOSIS — R7989 Other specified abnormal findings of blood chemistry: Secondary | ICD-10-CM

## 2014-07-03 DIAGNOSIS — R945 Abnormal results of liver function studies: Principal | ICD-10-CM

## 2014-07-03 NOTE — Progress Notes (Signed)
Quick Note:  OK. If his numbers are not improved in 2 weeks we may have him hold his pravastatin. ______

## 2014-07-17 ENCOUNTER — Encounter: Payer: Self-pay | Admitting: Cardiology

## 2014-07-17 ENCOUNTER — Ambulatory Visit (INDEPENDENT_AMBULATORY_CARE_PROVIDER_SITE_OTHER): Payer: Medicare HMO | Admitting: Cardiology

## 2014-07-17 VITALS — BP 116/82 | HR 94 | Ht 68.0 in | Wt 181.0 lb

## 2014-07-17 DIAGNOSIS — E785 Hyperlipidemia, unspecified: Secondary | ICD-10-CM

## 2014-07-17 DIAGNOSIS — I10 Essential (primary) hypertension: Secondary | ICD-10-CM

## 2014-07-17 DIAGNOSIS — I2510 Atherosclerotic heart disease of native coronary artery without angina pectoris: Secondary | ICD-10-CM

## 2014-07-17 NOTE — Patient Instructions (Signed)

## 2014-07-17 NOTE — Progress Notes (Signed)
Clinical Summary Mr. Brutus is a 74 y.o.male seen today for for follow up of the following medical problems.  1. 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 LV gram at that time 40-45% with akinesis of lateral wall. Do not see more recent echo in system - denies any chest pain. No SOB or DOE.  - lap chole 03/2014, later developed a fluid collection which was drained percutaneously. His plavix was stopped at that time and has not been restarted.   2. HTN - does not check at home - compliant with meds  3. Carotid stenosis - prior right CEA - followed by vascular surgery  4. Hyperlipidemia - compliant with statin. He has had some intermittent elevated liver enzymes due to recent issues with his gallbladder, statin changed to pravastatin 20mg  for less liver toxicity.     Past Medical History  Diagnosis Date  . Arteriosclerotic cardiovascular disease (ASCVD)     Stent to RI in 2004. Posterior MI in 4/07 with thrombosis of RI at stent site while off Plavix --> reintervention; do not d/c plavix  . Cerebrovascular disease     Left carotid bruit with 40-59% ICA stenosis; right carotid endarectomy in 2008  . Hypertension   . Colonic polyp 1999    Adenoma resected in 1999; history of diverticulosis  . Gastroesophageal reflux disease     Dilation of esophagel stricture in 2008  . Hyperlipidemia   . Cholelithiasis 07/21/2009    Asymptomatic  . 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 Arthritis   . Coronary artery disease      No Known Allergies   Current Outpatient Prescriptions  Medication Sig Dispense Refill  . aspirin EC 81 MG tablet Take 81 mg by mouth every evening.     . metoprolol succinate (TOPROL-XL) 25 MG 24 hr tablet Take 25 mg by mouth daily.      . Multiple Vitamin (MULTIVITAMIN) tablet Take 1 tablet by mouth daily.      10/2005 NITROSTAT 0.4 MG SL tablet DISSOLVE 1 TABLET UNDER  TONGUE  EVERY 5 MINUTES UPTO 15 MIN FOR CHEST PAIN.IF NO RELIEF CALL 911. 25 each 6  . omeprazole (PRILOSEC) 20 MG capsule Take 20 mg by mouth daily.    . pravastatin (PRAVACHOL) 20 MG tablet Take 1 tablet (20 mg total) by mouth every evening. 90 tablet 0  . ramipril (ALTACE) 10 MG capsule Take 10 mg by mouth daily.       No current facility-administered medications for this visit.     Past Surgical History  Procedure Laterality Date  . Carotid endarterectomy  2008    Right  . Colonoscopy  2008    adenoma resected in 1999; no intervention in 2008  . Cholecystectomy N/A 03/28/2014    Procedure: LAPAROSCOPIC CHOLECYSTECTOMY;  Surgeon: 05/28/2014, MD;  Location: [facility];  Service: General;  Laterality: N/A;  . Cardiac catheterization      cardiac stent placed  . Colonoscopy with esophagogastroduodenoscopy (egd)  Feb 2010    Dr. Mar 2010: mild erosive reflux esophagitis, non-critical Schatzki's ring without dilation, moderate sized hiatal hernia, bulbar erosions. Negative H.pylori serology. Colonoscopy with normal rectum, pancolonic diverticula, needs surveillance every 5 years due to history of adenomas.      No Known Allergies    Family History  Problem Relation Age of Onset  . Heart disease Father     Before age  60  . Hyperlipidemia Father   . Hypertension Father   . Stroke Other     Grandparent  . Heart attack Other     Grandparent  . Heart block      Brother  . Heart disease Paternal Grandmother   . Heart disease Paternal Grandfather   . Heart disease Brother   . Heart attack Brother   . Heart disease Brother     Before age 54-  Quad-BPG  . Hyperlipidemia Brother   . Hypertension Brother   . Heart attack Brother   . Colon cancer Neg Hx      Social History Mr. Rosenberger reports that he has never smoked. He has never used smokeless tobacco. Mr. Thieme reports that he does not drink alcohol.   Review of Systems CONSTITUTIONAL: No weight loss, fever, chills, weakness or  fatigue.  HEENT: Eyes: No visual loss, blurred vision, double vision or yellow sclerae.No hearing loss, sneezing, congestion, runny nose or sore throat.  SKIN: No rash or itching.  CARDIOVASCULAR: per HPI RESPIRATORY: No shortness of breath, cough or sputum.  GASTROINTESTINAL: No anorexia, nausea, vomiting or diarrhea. No abdominal pain or blood.  GENITOURINARY: No burning on urination, no polyuria NEUROLOGICAL: No headache, dizziness, syncope, paralysis, ataxia, numbness or tingling in the extremities. No change in bowel or bladder control.  MUSCULOSKELETAL: No muscle, back pain, joint pain or stiffness.  LYMPHATICS: No enlarged nodes. No history of splenectomy.  PSYCHIATRIC: No history of depression or anxiety.  ENDOCRINOLOGIC: No reports of sweating, cold or heat intolerance. No polyuria or polydipsia.  Marland Kitchen   Physical Examination p 94 bp 116/82 Wt 181 lbs BMI 28 Gen: resting comfortably, no acute distress HEENT: no scleral icterus, pupils equal round and reactive, no palptable cervical adenopathy,  CV: RRR, no m/r/g, no JVD Resp: Clear to auscultation bilaterally GI: abdomen is soft, non-tender, non-distended, normal bowel sounds, no hepatosplenomegaly MSK: extremities are warm, no edema.  Skin: warm, no rash Neuro:  no focal deficits Psych: appropriate affect   Diagnostic Studies 09/2005 Cath RESULTS: The aortic pressure was 122/62 with mean of 89 and left  ventricular pressure was 122/20.  LEFT MAIN CORONARY ARTERY: The left main coronary artery was free of  disease.  LEFT ANTERIOR DESCENDING ARTERY: The left anterior descending artery gave  rise to a large diagonal Shakirra Buehler and two septal perforators. There was 70%  narrowing of the proximal LAD and 90% stenosis of the ostium of the  diagonal Roseana Rhine.  CIRCUMFLEX ARTERY: The circumflex artery gave rise to a ramus Cande Mastropietro and  two marginal branches and the posterolateral branches. The ramus Jermya Dowding was  completed  occluded near its ostium within the stent. There was __________  ostial stenosis of the first marginal Brandilee Pies.  RIGHT CORONARY ARTERY: The right coronary artery is a moderate size vessel  that gave rise to a right ventricular Zerrick Hanssen, posterior descending Laymond Postle  and two posterolateral branches. There was 30% narrowing in the mid right  coronary artery.  LEFT VENTRICULOGRAM: The left ventriculogram performed in the RAO  projection showed akinesis of the lateral wall. The estimated ejection  fraction was 40-45%. The left ventriculogram performed in the LAO  projection showed akinesis of the posterior and inferolateral wall. The tip  of the apex and septum moved well and the superior portion of the posterior  wall moved well.  Following aspiration thrombectomy and PTCA of the in-stent thrombotic lesion  in the ramus Kamarion Zagami of the circumflex artery the stenosis improved from 100%  to 0% and the flow improved from TIMI 0 to TIMI 3 flow. Myocardial blush  improved from grade 0 to grade 2.  The patient had the onset of chest pain at 12:35 and arrived at [facility]  emergency room at 12:42. The first ECG was obtained at 12:50 and this was  not diagnostic. The second ECG at 1325 was diagnostic for a posterior  infarction. The patient arrived at the cath lab at [facility] at 1510 and  the reperfusion was established with a diver catheter at 1552. This gave a  __________ time from the time of the diagnostic ECG of 2 hours and 27  minutes and a refusion time of 2 hours and 27 minutes.  CONCLUSION:  1. Acute posterior wall myocardial infarction due to stent thrombosis  within the stent in the ramus Madia Carvell of circumflex artery with a total  occlusion of the ramus Chee Dimon of the circumflex artery, 70% narrowing at  the ostium of the first marginal Haelee Bolen of the circumflex artery, 70%  narrowing of the proximal LAD with 90% narrowing in the large diagonal  Malic Rosten, 30%  narrowing in the mid right coronary and posterior and  inferolateral wall akinesis with an estimated ejection fraction of 40-  45%.  2. Successful aspiration thrombectomy and PTCA for in-stent thrombosis of  the ramus Raye Wiens of the circumflex artery with improvement in central  narrowing from 100% to 0%, improvement of flow from TIMI 0 to TIMI 3  flow.     Assessment and Plan   1. CAD/ICM - no current symptoms - continue risk factor modifcation and secondary prevention - will touch base with surgery ad GI if ok to restart plavix  2. HTN - at goal, continue current meds  3. Carotid stenosis - no current symptoms, continue regular follow up with vascular  4. Hyperlipidemia - elevated LFTs related to recent issues with gallbladder, continue pravastatin at this time. Consider changing back to atorva once liver issues resolved   F/u 6 months     Antoine Poche, M.D.

## 2014-07-21 LAB — HEPATIC FUNCTION PANEL
ALBUMIN: 3.9 g/dL (ref 3.5–5.2)
ALK PHOS: 109 U/L (ref 39–117)
ALT: 20 U/L (ref 0–53)
AST: 20 U/L (ref 0–37)
BILIRUBIN INDIRECT: 0.5 mg/dL (ref 0.2–1.2)
Bilirubin, Direct: 0.2 mg/dL (ref 0.0–0.3)
Total Bilirubin: 0.7 mg/dL (ref 0.2–1.2)
Total Protein: 7.3 g/dL (ref 6.0–8.3)

## 2014-07-22 ENCOUNTER — Telehealth: Payer: Self-pay

## 2014-07-22 NOTE — Telephone Encounter (Signed)
Pt.notified

## 2014-07-22 NOTE — Telephone Encounter (Signed)
-----   Message from Antoine Poche, MD sent at 07/22/2014 10:22 AM EST ----- Please let patient know that I heard back from Dr Lovell Sheehan and he is ok with restarting plavix. Please have him restart plavix 75mg  daily  MD  ----- Message -----    From: Dominga Ferry, MD    Sent: 07/20/2014  11:26 AM      To: 07/22/2014, MD  May restart plavix from my standpoint.  Antoine Poche, MD ----- Message -----    From: Franky Macho, MD    Sent: 07/19/2014   6:38 AM      To: 07/21/2014, MD, Dalia Heading, PA-C, #  Greetings,  In regards to a mutual patient of ours, Kevin Reeves. He had troubles with a fluid collection after a recent cholecystomy and his plavix was stopped at that time. Continued biliary issues with recent MRCP by GI. He has a history of late stent thrombosis off plavix and I'd like to restart when ok from the surgical and GI standpoint. Please let me know your thoughts regarding timing of restarting.   Hoover Browns MD

## 2014-07-23 NOTE — Progress Notes (Signed)
Quick Note:  Please let patient know his LFTs are now normal! He still should keep his hospital follow up as planned. ______

## 2014-08-04 ENCOUNTER — Ambulatory Visit (INDEPENDENT_AMBULATORY_CARE_PROVIDER_SITE_OTHER): Payer: Medicare HMO | Admitting: Nurse Practitioner

## 2014-08-04 ENCOUNTER — Encounter: Payer: Self-pay | Admitting: Nurse Practitioner

## 2014-08-04 VITALS — BP 107/74 | HR 112 | Temp 98.2°F | Ht 68.0 in | Wt 182.2 lb

## 2014-08-04 DIAGNOSIS — R740 Nonspecific elevation of levels of transaminase and lactic acid dehydrogenase [LDH]: Secondary | ICD-10-CM

## 2014-08-04 DIAGNOSIS — R1011 Right upper quadrant pain: Secondary | ICD-10-CM

## 2014-08-04 DIAGNOSIS — R7401 Elevation of levels of liver transaminase levels: Secondary | ICD-10-CM

## 2014-08-04 NOTE — Assessment & Plan Note (Addendum)
Admission end of December 2015/early January 2016 for abdominal pain and transaminitis. Hepatic function slowly trended to normal. Last labs about 2 weeks ago show normal hepatic function. Concerned about recurrent episode due to RUQ pain (s/p cholecystectomy) and mild discoloration of urine. Given recent history of heavy lifting and physical exam is likely musculoskelatal in nature. But will recheck labs for any change in hepatic function as well as UA reflex to C&S for urine bili vs UTI or other etiology. RTC in 3 weeks for follow-up or as needed.

## 2014-08-04 NOTE — Patient Instructions (Signed)
1. Have your labs drawn, we will call you with the results. 2. Return for follow-up in 3 weeks or as needed for worsening symptoms 3. We will hold the colonoscopy temporarily as you've asked to allow him to get stronger.

## 2014-08-04 NOTE — Progress Notes (Signed)
Referring Provider: Redmond School, MD Primary Care Physician:  Glo Herring., MD Primary GI: Dr. Gala Romney  Chief Complaint  Patient presents with  . Abdominal Pain    HPI:   74 year old male presents for hospital follow-up. Was admitted to the hospital 06/24/14 for RUQ abdominal pain, elevated LFTs, and fatigue with concern for possible microlithiasis, retained CBD stone. Underwent laparoscopic cholecystectomy in Oct 2015 with subsequent postoperative hematoma in the setting of Plavix, requiring JP drain; this has since resolved and CT shows improvement in residual fluid collection. No evidence of CBD dilatation on CT but LFT pattern was concerning. Acute hepatitis panel negative. MRCP showed "no evidence of CBD stone or biliary dilation. There is mild gradual tapering of the right hepatic duct, just proximal to the confluence, and this may be secondary to the recent surgery as there is no dilatation of ducts proximal to this location." Unclear significance. Patient improved during hospitalization, diet advanced without issue. LFTs slowly trended down. At discharge AST/ALT 115/176 (decreased from 167/268), bili 2.4 (decreased from 3.6), and alk phos 211 (decreased 265). 3 days after discharge, AST/ALT 50/96, bili 1.4, direct bili 0.6, alk phos 270.  Repeat hepatic function panel 1/25 (approximatley 3 weeks post discharge) and all labs normalized: AST/ALT 20/20, bili 0.7, direct bili 0.2, Alk phos 109. ?passed a stone +/- cholestasis related to surgery/hematoma/etc. Was also noted as an unrelated issue the patient is due for surveillance colonoscopy now and it was decided that would be arranged on outpatient follow-up.  Today states he's had good days and bad days. He recently lifted his dog and thinks he may have pulled something. His wife, however, feels he's getting back "into his same cycle." In the past week has had more weakness, decreased appetite, and RUQ pain which he is unsure of if  it's muscular or a repeat of his previous symptoms. The pain is sharp, worsened by movement, coughing, and sneezing. If he sits still it does not hurt. Wife also states he's having shortness of breath as well. Denies any other abdominal pain. Has a bowel movement about once a day which is normal/formed "most of the time" but rarely loose or constipated. Denies hematochezia and melena. Rare NSAID use. Denies ASA powder use. Also complaining of orange-color of his urine. Previously this was attributed to liver function. No other upper or lower GI symptoms.   Past Medical History  Diagnosis Date  . Arteriosclerotic cardiovascular disease (ASCVD)     Stent to RI in 2004. Posterior MI in 4/07 with thrombosis of RI at stent site while off Plavix --> reintervention; do not d/c plavix  . Cerebrovascular disease     Left carotid bruit with 40-59% ICA stenosis; right carotid endarectomy in 2008  . Hypertension   . Colonic polyp 1999    Adenoma resected in 1999; history of diverticulosis  . Gastroesophageal reflux disease     Dilation of esophagel stricture in 2008  . Hyperlipidemia   . Cholelithiasis 07/21/2009    Asymptomatic  . 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 Arthritis   . Coronary artery disease     Past Surgical History  Procedure Laterality Date  . Carotid endarterectomy  2008    Right  . Colonoscopy  2008    adenoma resected in 1999; no intervention in 2008  . Cholecystectomy N/A 03/28/2014    Procedure: LAPAROSCOPIC CHOLECYSTECTOMY;  Surgeon: Jamesetta So, MD;  Location: [facility];  Service: General;  Laterality: N/A;  . Cardiac catheterization  cardiac stent placed  . Colonoscopy with esophagogastroduodenoscopy (egd)  Feb 2010    Dr. Gala Romney: mild erosive reflux esophagitis, non-critical Schatzki's ring without dilation, moderate sized hiatal hernia, bulbar erosions. Negative H.pylori serology. Colonoscopy with normal rectum, pancolonic diverticula, needs surveillance every 5 years due  to history of adenomas.     Current Outpatient Prescriptions  Medication Sig Dispense Refill  . aspirin EC 81 MG tablet Take 81 mg by mouth every evening.     . metoprolol succinate (TOPROL-XL) 25 MG 24 hr tablet Take 25 mg by mouth daily.      . Multiple Vitamin (MULTIVITAMIN) tablet Take 1 tablet by mouth daily.      Marland Kitchen NITROSTAT 0.4 MG SL tablet DISSOLVE 1 TABLET UNDER  TONGUE EVERY 5 MINUTES UPTO 15 MIN FOR CHEST PAIN.IF NO RELIEF CALL 911. 25 each 6  . omeprazole (PRILOSEC) 20 MG capsule Take 20 mg by mouth daily.    . ramipril (ALTACE) 10 MG capsule Take 10 mg by mouth daily.      . clopidogrel (PLAVIX) 75 MG tablet Take 75 mg by mouth daily.    . pravastatin (PRAVACHOL) 20 MG tablet Take 1 tablet (20 mg total) by mouth every evening. (Patient not taking: Reported on 08/04/2014) 90 tablet 0   No current facility-administered medications for this visit.    Allergies as of 08/04/2014  . (No Known Allergies)    Family History  Problem Relation Age of Onset  . Heart disease Father     Before age 21  . Hyperlipidemia Father   . Hypertension Father   . Stroke Other     Grandparent  . Heart attack Other     Grandparent  . Heart block      Brother  . Heart disease Paternal Grandmother   . Heart disease Paternal Grandfather   . Heart disease Brother   . Heart attack Brother   . Heart disease Brother     Before age 56-  65  . Hyperlipidemia Brother   . Hypertension Brother   . Heart attack Brother   . Colon cancer Neg Hx     History   Social History  . Marital Status: Married    Spouse Name: N/A    Number of Children: N/A  . Years of Education: N/A   Occupational History  . [employer]     Part time for 50 years-working 3 days per week, now retired June 2015.    Social History Main Topics  . Smoking status: Never Smoker   . Smokeless tobacco: Never Used  . Alcohol Use: No  . Drug Use: No  . Sexual Activity:    Partners: Female    Music therapist: None   Other Topics Concern  . None   Social History Narrative   Married and resides in [place]     Review of Systems: Gen: Denies fever, chills, anorexia. Admits some fatigue, weakness. Some weight loss.  CV: Denies chest pain, palpitations, syncope, peripheral edema. Resp: Admits some dyspnea at rest. Denies wheezing. GI: Denies vomiting blood, jaundice. Denies dysphagia or odynophagia. Derm: Denies rash, itching, dry skin Psych: Denies depression, anxiety, memory loss, confusion. No homicidal or suicidal ideation.  Heme: Denies bruising, bleeding, and enlarged lymph nodes.  Physical Exam: BP 107/74 mmHg  Pulse 112  Temp(Src) 98.2 F (36.8 C) (Oral)  Ht 5' 8" (1.727 m)  Wt 182 lb 3.2 oz (82.645 kg)  BMI 27.71 kg/m2 General:  Alert and oriented. No distress noted. Pleasant and cooperative.  Head:  Normocephalic and atraumatic. Eyes:  Conjuctiva clear without scleral icterus. Mouth:  Oral mucosa pink and moist. Good dentition. No lesions. Neck:  Supple, without mass or thyromegaly. Lungs:  Clear to auscultation bilaterally. No wheezes, rales, or rhonchi. No distress.  Heart:  S1, S2 present without murmurs, rubs, or gallops. Regular rate and rhythm. Abdomen:  +BS, soft, non-tender and non-distended. No rebound or guarding. No HSM or masses noted.  Msk:  Symmetrical without gross deformities. Normal posture. Mild to moderate associated pain to palpation of right anterolateral rib cage. Pulses:  2+ DP noted bilaterally Extremities:  Without edema. Neurologic:  Alert and  oriented x4;  grossly normal neurologically. Skin:  Intact without significant lesions or rashes. Cervical Nodes:  No significant cervical adenopathy. Psych:  Alert and cooperative. Normal mood and affect.    08/04/2014 10:55 AM

## 2014-08-04 NOTE — Assessment & Plan Note (Signed)
Complaining of RUQ abdominal pain which seems to be musculoskeletal in origin. History presents likely pulled muscle from lifting his dog, also possible rig fracture without red flags signs. However, given recent history of abdominal pain and transaminitis will recheck hepatic function. Surgically absent gallbladder. Last LFTs normal. Also concerned about mild discoloration of urine which he noticed before hospitalization for transaminitis. Will check UA for bili versus other etiologies like UTI. Patient and wife wish to hold off on colonoscopy at this point until he's feeling better. Will check CBC, hepatic function panel, and UA reflex to C&S. Return for follow-up in 3 weeks

## 2014-08-05 NOTE — Progress Notes (Signed)
cc'ed to pcp °

## 2014-08-06 ENCOUNTER — Other Ambulatory Visit: Payer: Self-pay | Admitting: Nurse Practitioner

## 2014-08-06 LAB — CBC WITH DIFFERENTIAL/PLATELET
BASOS PCT: 0 % (ref 0–1)
Basophils Absolute: 0 10*3/uL (ref 0.0–0.1)
EOS ABS: 0.1 10*3/uL (ref 0.0–0.7)
EOS PCT: 1 % (ref 0–5)
HCT: 37 % — ABNORMAL LOW (ref 39.0–52.0)
HEMOGLOBIN: 12.1 g/dL — AB (ref 13.0–17.0)
Lymphocytes Relative: 24 % (ref 12–46)
Lymphs Abs: 2.4 10*3/uL (ref 0.7–4.0)
MCH: 29 pg (ref 26.0–34.0)
MCHC: 32.7 g/dL (ref 30.0–36.0)
MCV: 88.7 fL (ref 78.0–100.0)
MONO ABS: 1.2 10*3/uL — AB (ref 0.1–1.0)
MONOS PCT: 12 % (ref 3–12)
MPV: 10.2 fL (ref 8.6–12.4)
NEUTROS PCT: 63 % (ref 43–77)
Neutro Abs: 6.2 10*3/uL (ref 1.7–7.7)
Platelets: 407 10*3/uL — ABNORMAL HIGH (ref 150–400)
RBC: 4.17 MIL/uL — ABNORMAL LOW (ref 4.22–5.81)
RDW: 14.8 % (ref 11.5–15.5)
WBC: 9.8 10*3/uL (ref 4.0–10.5)

## 2014-08-06 LAB — HEPATIC FUNCTION PANEL
ALBUMIN: 3.5 g/dL (ref 3.5–5.2)
ALK PHOS: 265 U/L — AB (ref 39–117)
ALT: 76 U/L — AB (ref 0–53)
AST: 52 U/L — AB (ref 0–37)
Bilirubin, Direct: 0.8 mg/dL — ABNORMAL HIGH (ref 0.0–0.3)
Indirect Bilirubin: 0.8 mg/dL (ref 0.2–1.2)
Total Bilirubin: 1.6 mg/dL — ABNORMAL HIGH (ref 0.2–1.2)
Total Protein: 7.1 g/dL (ref 6.0–8.3)

## 2014-08-07 LAB — URINALYSIS, ROUTINE W REFLEX MICROSCOPIC
Glucose, UA: NEGATIVE mg/dL
Hgb urine dipstick: NEGATIVE
Ketones, ur: NEGATIVE mg/dL
LEUKOCYTES UA: NEGATIVE
NITRITE: NEGATIVE
Protein, ur: NEGATIVE mg/dL
Specific Gravity, Urine: 1.016 (ref 1.005–1.030)
Urobilinogen, UA: 1 mg/dL (ref 0.0–1.0)
pH: 5.5 (ref 5.0–8.0)

## 2014-08-08 ENCOUNTER — Telehealth: Payer: Self-pay | Admitting: Nurse Practitioner

## 2014-08-08 ENCOUNTER — Telehealth: Payer: Self-pay | Admitting: Internal Medicine

## 2014-08-08 NOTE — Telephone Encounter (Signed)
Called patient and notified of results. Hepatic function beginning to trend back up. Was started on Statin post-discharge and had been taking until 2-3 days ptior to office visit when the patient stopped taking it. Since his visit he states his urine has cleared up. Continued with some abdominal pain, still seems consistent with musculoskeletal issue after lifting heavy dog. Will recheck labs at 3 week follow-up. Advised patient to notify us of any changes or worsening symptoms or if emergent situation to proceed to the ER.

## 2014-08-08 NOTE — Telephone Encounter (Signed)
Patient calling to see if his lab results from Tuesday are available. Please call [phone]

## 2014-08-08 NOTE — Telephone Encounter (Signed)
opened in error

## 2014-08-08 NOTE — Telephone Encounter (Signed)
Routing to EG for results.  

## 2014-08-12 ENCOUNTER — Encounter (HOSPITAL_COMMUNITY): Payer: Self-pay | Admitting: Emergency Medicine

## 2014-08-12 ENCOUNTER — Emergency Department (HOSPITAL_COMMUNITY)
Admission: EM | Admit: 2014-08-12 | Discharge: 2014-08-12 | Disposition: A | Discharge status: Home / Self Care | Payer: Medicare HMO | Attending: Emergency Medicine | Admitting: Emergency Medicine

## 2014-08-12 DIAGNOSIS — I10 Essential (primary) hypertension: Secondary | ICD-10-CM | POA: Diagnosis not present

## 2014-08-12 DIAGNOSIS — Z7982 Long term (current) use of aspirin: Secondary | ICD-10-CM | POA: Diagnosis not present

## 2014-08-12 DIAGNOSIS — Z8673 Personal history of transient ischemic attack (TIA), and cerebral infarction without residual deficits: Secondary | ICD-10-CM | POA: Insufficient documentation

## 2014-08-12 DIAGNOSIS — Z7902 Long term (current) use of antithrombotics/antiplatelets: Secondary | ICD-10-CM | POA: Insufficient documentation

## 2014-08-12 DIAGNOSIS — Z9889 Other specified postprocedural states: Secondary | ICD-10-CM | POA: Diagnosis not present

## 2014-08-12 DIAGNOSIS — L02213 Cutaneous abscess of chest wall: Secondary | ICD-10-CM | POA: Insufficient documentation

## 2014-08-12 DIAGNOSIS — Z86010 Personal history of colonic polyps: Secondary | ICD-10-CM | POA: Insufficient documentation

## 2014-08-12 DIAGNOSIS — I252 Old myocardial infarction: Secondary | ICD-10-CM | POA: Diagnosis not present

## 2014-08-12 DIAGNOSIS — E785 Hyperlipidemia, unspecified: Secondary | ICD-10-CM | POA: Diagnosis not present

## 2014-08-12 DIAGNOSIS — K219 Gastro-esophageal reflux disease without esophagitis: Secondary | ICD-10-CM | POA: Insufficient documentation

## 2014-08-12 DIAGNOSIS — M1990 Unspecified osteoarthritis, unspecified site: Secondary | ICD-10-CM | POA: Diagnosis not present

## 2014-08-12 DIAGNOSIS — Z79899 Other long term (current) drug therapy: Secondary | ICD-10-CM | POA: Insufficient documentation

## 2014-08-12 DIAGNOSIS — I2510 Atherosclerotic heart disease of native coronary artery without angina pectoris: Secondary | ICD-10-CM | POA: Diagnosis not present

## 2014-08-12 LAB — CBC WITH DIFFERENTIAL/PLATELET
BASOS ABS: 0 10*3/uL (ref 0.0–0.1)
BASOS PCT: 0 % (ref 0–1)
Eosinophils Absolute: 0.4 10*3/uL (ref 0.0–0.7)
Eosinophils Relative: 5 % (ref 0–5)
HEMATOCRIT: 37 % — AB (ref 39.0–52.0)
Hemoglobin: 11.8 g/dL — ABNORMAL LOW (ref 13.0–17.0)
Lymphocytes Relative: 27 % (ref 12–46)
Lymphs Abs: 2.4 10*3/uL (ref 0.7–4.0)
MCH: 28.9 pg (ref 26.0–34.0)
MCHC: 31.9 g/dL (ref 30.0–36.0)
MCV: 90.5 fL (ref 78.0–100.0)
MONO ABS: 0.9 10*3/uL (ref 0.1–1.0)
Monocytes Relative: 10 % (ref 3–12)
NEUTROS ABS: 5 10*3/uL (ref 1.7–7.7)
NEUTROS PCT: 58 % (ref 43–77)
PLATELETS: 546 10*3/uL — AB (ref 150–400)
RBC: 4.09 MIL/uL — ABNORMAL LOW (ref 4.22–5.81)
RDW: 14.7 % (ref 11.5–15.5)
WBC: 8.8 10*3/uL (ref 4.0–10.5)

## 2014-08-12 LAB — COMPREHENSIVE METABOLIC PANEL
ALK PHOS: 273 U/L — AB (ref 39–117)
ALT: 76 U/L — AB (ref 0–53)
AST: 51 U/L — ABNORMAL HIGH (ref 0–37)
Albumin: 2.8 g/dL — ABNORMAL LOW (ref 3.5–5.2)
Anion gap: 8 (ref 5–15)
BUN: 25 mg/dL — ABNORMAL HIGH (ref 6–23)
CALCIUM: 9.2 mg/dL (ref 8.4–10.5)
CO2: 25 mmol/L (ref 19–32)
Chloride: 101 mmol/L (ref 96–112)
Creatinine, Ser: 1.14 mg/dL (ref 0.50–1.35)
GFR calc Af Amer: 71 mL/min — ABNORMAL LOW (ref >90)
GFR, EST NON AFRICAN AMERICAN: 61 mL/min — AB (ref >90)
GLUCOSE: 104 mg/dL — AB (ref 70–99)
Potassium: 4.9 mmol/L (ref 3.5–5.1)
SODIUM: 134 mmol/L — AB (ref 135–145)
Total Bilirubin: 0.6 mg/dL (ref 0.3–1.2)
Total Protein: 7.6 g/dL (ref 6.0–8.3)

## 2014-08-12 MED ORDER — DOXYCYCLINE HYCLATE 100 MG PO TABS
100.0000 mg | ORAL_TABLET | Freq: Once | ORAL | Status: AC
  Administered 2014-08-12: 100 mg via ORAL
  Filled 2014-08-12: qty 1

## 2014-08-12 MED ORDER — DOXYCYCLINE HYCLATE 100 MG PO TABS: 100.0000 mg | ORAL_TABLET | Freq: Two times a day (BID) | ORAL | Status: DC

## 2014-08-12 NOTE — ED Notes (Signed)
Dressing applied to patients right upper quadrant

## 2014-08-12 NOTE — ED Provider Notes (Signed)
CSN: [id number]     Arrival date & time 08/12/14  1452 History   First MD Initiated Contact with Patient 08/12/14 1842     Chief Complaint  Patient presents with  . Abscess      HPI Pt was seen at 1900.  Per pt and his spouse, c/o gradual onset and persistence of constant "abscess" to pt's lower right chest wall for the past 1 to 2 days. Pt states "it popped" PTA to the ED and "now it's draining." Pt states "it feels better now that it opened up." Denies fevers, no red streaking, no abd pain.    Past Medical History  Diagnosis Date  . Arteriosclerotic cardiovascular disease (ASCVD)     Stent to RI in 2004. Posterior MI in 4/07 with thrombosis of RI at stent site while off Plavix --> reintervention; do not d/c plavix  . Cerebrovascular disease     Left carotid bruit with 40-59% ICA stenosis; right carotid endarectomy in 2008  . Hypertension   . Colonic polyp 1999    Adenoma resected in 1999; history of diverticulosis  . Gastroesophageal reflux disease     Dilation of esophagel stricture in 2008  . Hyperlipidemia   . Cholelithiasis 07/21/2009    Asymptomatic  . 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 Arthritis   . Coronary artery disease    Past Surgical History  Procedure Laterality Date  . Carotid endarterectomy  2008    Right  . Colonoscopy  2008    adenoma resected in 1999; no intervention in 2008  . Cholecystectomy N/A 03/28/2014    Procedure: LAPAROSCOPIC CHOLECYSTECTOMY;  Surgeon: Dalia Heading, MD;  Location: [facility];  Service: General;  Laterality: N/A;  . Cardiac catheterization      cardiac stent placed  . Colonoscopy with esophagogastroduodenoscopy (egd)  Feb 2010    Dr. Jena Gauss: mild erosive reflux esophagitis, non-critical Schatzki's ring without dilation, moderate sized hiatal hernia, bulbar erosions. Negative H.pylori serology. Colonoscopy with normal rectum, pancolonic diverticula, needs surveillance every 5 years due to history of adenomas.    Family History  Problem  Relation Age of Onset  . Heart disease Father     Before age 26  . Hyperlipidemia Father   . Hypertension Father   . Stroke Other     Grandparent  . Heart attack Other     Grandparent  . Heart block      Brother  . Heart disease Paternal Grandmother   . Heart disease Paternal Grandfather   . Heart disease Brother   . Heart attack Brother   . Heart disease Brother     Before age 77-  Quad-BPG  . Hyperlipidemia Brother   . Hypertension Brother   . Heart attack Brother   . Colon cancer Neg Hx    History  Substance Use Topics  . Smoking status: Never Smoker   . Smokeless tobacco: Never Used  . Alcohol Use: No    Review of Systems ROS: Statement: All systems negative except as marked or noted in the HPI; Constitutional: Negative for fever and chills. ; ; Eyes: Negative for eye pain, redness and discharge. ; ; ENMT: Negative for ear pain, hoarseness, nasal congestion, sinus pressure and sore throat. ; ; Cardiovascular: Negative for chest pain, palpitations, diaphoresis, dyspnea and peripheral edema. ; ; Respiratory: Negative for cough, wheezing and stridor. ; ; Gastrointestinal: Negative for nausea, vomiting, diarrhea, abdominal pain, blood in stool, hematemesis, jaundice and rectal bleeding. . ; ; Genitourinary: Negative for dysuria,  flank pain and hematuria. ; ; Musculoskeletal: Negative for back pain and neck pain. Negative for swelling and trauma.; ; Skin: Negative for pruritus, abrasions, blisters, bruising and +skin lesion.; ; Neuro: Negative for headache, lightheadedness and neck stiffness. Negative for weakness, altered level of consciousness , altered mental status, extremity weakness, paresthesias, involuntary movement, seizure and syncope.      Allergies  Review of patient's allergies indicates no known allergies.  Home Medications   Prior to Admission medications   Medication Sig Start Date End Date Taking? Authorizing Provider  aspirin EC 81 MG tablet Take 81 mg by  mouth every evening.    Yes Historical Provider, MD  clopidogrel (PLAVIX) 75 MG tablet Take 75 mg by mouth daily.   Yes Historical Provider, MD  metoprolol succinate (TOPROL-XL) 25 MG 24 hr tablet Take 25 mg by mouth daily.     Yes Historical Provider, MD  Multiple Vitamin (MULTIVITAMIN) tablet Take 1 tablet by mouth daily.     Yes Historical Provider, MD  NITROSTAT 0.4 MG SL tablet DISSOLVE 1 TABLET UNDER  TONGUE EVERY 5 MINUTES UPTO 15 MIN FOR CHEST PAIN.IF NO RELIEF CALL 911. 02/22/11  Yes Kathlen Brunswick, MD  omeprazole (PRILOSEC) 20 MG capsule Take 20 mg by mouth daily.   Yes Historical Provider, MD  pravastatin (PRAVACHOL) 20 MG tablet Take 1 tablet (20 mg total) by mouth every evening. 03/24/14  Yes Antoine Poche, MD  ramipril (ALTACE) 10 MG capsule Take 10 mg by mouth daily.     Yes Historical Provider, MD   BP 101/60 mmHg  Pulse 69  Temp(Src) 97.5 F (36.4 C) (Oral)  Resp 16  Ht 5\' 8"  (1.727 m)  Wt 174 lb (78.926 kg)  BMI 26.46 kg/m2  SpO2 96% Physical Exam  1905: Physical examination:  Nursing notes reviewed; Vital signs and O2 SAT reviewed;  Constitutional: Well developed, Well nourished, Well hydrated, In no acute distress; Head:  Normocephalic, atraumatic; Eyes: EOMI, PERRL, No scleral icterus; ENMT: Mouth and pharynx normal, Mucous membranes moist; Neck: Supple, Full range of motion, No lymphadenopathy; Cardiovascular: Regular rate and rhythm, No gallop; Respiratory: Breath sounds clear & equal bilaterally, No wheezes.  Speaking full sentences with ease, Normal respiratory effort/excursion; Chest: No deformity. No soft tissue crepitus. Movement normal. +right lower anterior-lateral chest wall with open abscess spontaneously draining purulent fluid, +localized surrounding erythema. No palp fluctuance..; Abdomen: Soft, Nontender, Nondistended, Normal bowel sounds; Genitourinary: No CVA tenderness; Extremities: Pulses normal, No tenderness, No edema, No calf edema or asymmetry.;  Neuro: AA&Ox3, Major CN grossly intact.  Speech clear. No gross focal motor or sensory deficits in extremities. Climbs on and off stretcher easily by himself. Gait steady.; Skin: Color normal, Warm, Dry.   ED Course  Procedures     EKG Interpretation None      MDM  MDM Reviewed: previous chart, nursing note and vitals Reviewed previous: labs Interpretation: labs      Results for orders placed or performed during the hospital encounter of 08/12/14  CBC with Differential  Result Value Ref Range   WBC 8.8 4.0 - 10.5 K/uL   RBC 4.09 (L) 4.22 - 5.81 MIL/uL   Hemoglobin 11.8 (L) 13.0 - 17.0 g/dL   HCT 33.0 (L) 07.6 - 22.6 %   MCV 90.5 78.0 - 100.0 fL   MCH 28.9 26.0 - 34.0 pg   MCHC 31.9 30.0 - 36.0 g/dL   RDW 33.3 54.5 - 62.5 %   Platelets 546 (H) 150 -  400 K/uL   Neutrophils Relative % 58 43 - 77 %   Neutro Abs 5.0 1.7 - 7.7 K/uL   Lymphocytes Relative 27 12 - 46 %   Lymphs Abs 2.4 0.7 - 4.0 K/uL   Monocytes Relative 10 3 - 12 %   Monocytes Absolute 0.9 0.1 - 1.0 K/uL   Eosinophils Relative 5 0 - 5 %   Eosinophils Absolute 0.4 0.0 - 0.7 K/uL   Basophils Relative 0 0 - 1 %   Basophils Absolute 0.0 0.0 - 0.1 K/uL  Comprehensive metabolic panel  Result Value Ref Range   Sodium 134 (L) 135 - 145 mmol/L   Potassium 4.9 3.5 - 5.1 mmol/L   Chloride 101 96 - 112 mmol/L   CO2 25 19 - 32 mmol/L   Glucose, Bld 104 (H) 70 - 99 mg/dL   BUN 25 (H) 6 - 23 mg/dL   Creatinine, Ser 9.41 0.50 - 1.35 mg/dL   Calcium 9.2 8.4 - 74.0 mg/dL   Total Protein 7.6 6.0 - 8.3 g/dL   Albumin 2.8 (L) 3.5 - 5.2 g/dL   AST 51 (H) 0 - 37 U/L   ALT 76 (H) 0 - 53 U/L   Alkaline Phosphatase 273 (H) 39 - 117 U/L   Total Bilirubin 0.6 0.3 - 1.2 mg/dL   GFR calc non Af Amer 61 (L) >90 mL/min   GFR calc Af Amer 71 (L) >90 mL/min   Anion gap 8 5 - 15    1955:  +abscess to right lower anterior-lateral ribs area (NOT abd wall) is already open and draining. Small amount of purulent drainage expressed  manually. No palp fluctuance to area. Will rx abx for surrounding cellulitis. Abd soft/NT. Pt tol PO well while in the ED without N/V. VS per pt's baseline per EPIC chart review. Dx and testing d/w pt and family.  Questions answered.  Verb understanding, agreeable to d/c home with outpt f/u in the next 48 hours.     Samuel Jester, DO 08/15/14 0012

## 2014-08-12 NOTE — Discharge Instructions (Signed)
°Emergency Department Resource Guide °1) Find a Doctor and Pay Out of Pocket °Although you won't have to find out who is covered by your insurance plan, it is a good idea to ask around and get recommendations. You will then need to call the office and see if the doctor you have chosen will accept you as a new patient and what types of options they offer for patients who are self-pay. Some doctors offer discounts or will set up payment plans for their patients who do not have insurance, but you will need to ask so you aren't surprised when you get to your appointment. ° °2) Contact Your Local Health Department °Not all health departments have doctors that can see patients for sick visits, but many do, so it is worth a call to see if yours does. If you don't know where your local health department is, you can check in your phone book. The CDC also has a tool to help you locate your state's health department, and many state websites also have listings of all of their local health departments. ° °3) Find a Walk-in Clinic °If your illness is not likely to be very severe or complicated, you may want to try a walk in clinic. These are popping up all over the country in pharmacies, drugstores, and shopping centers. They're usually staffed by nurse practitioners or physician assistants that have been trained to treat common illnesses and complaints. They're usually fairly quick and inexpensive. However, if you have serious medical issues or chronic medical problems, these are probably not your best option. ° °No Primary Care Doctor: °- Call [facility] at  832-8000 - they can help you locate a primary care doctor that  accepts your insurance, provides certain services, etc. °- Physician Referral Service- 1-800-533-3463 ° °Chronic Pain Problems: °Organization         Address  Phone   Notes  °[facility]  (336) 297-2271 Patients need to be referred by their primary care doctor.  ° °Medication  Assistance: °Organization         Address  Phone   Notes  °[place] [facility] [address] °[place], [place] [address] (336) 641-8030 --Must be a resident of [place] °-- Must have NO insurance coverage whatsoever (no Medicaid/ Medicare, etc.) °-- The pt. MUST have a primary care doctor that directs their care regularly and follows them in the community °  °[facility]  (866) 331-1348   °[employer]  (888) 892-1162   ° °Agencies that provide inexpensive medical care: °Organization         Address  Phone   Notes  °[facility]  (336) 832-8035   °[facility]    (336) 832-7272   °[facility] [address] °[place], [place] [address] (336) 832-4777   °[facility] [address], °[place] (336) 271-4999   °[facility]    (336) 373-0678   °[facility]    (336) 272-1050   °[facility] ° [address], [place] Phone:  (336) 832-4444, Fax:  (336) 832-4440 Hours of Operation:  9 am - 6 pm, M-F.  Also accepts Medicaid/Medicare and self-pay.  °[facility] ° [address], [place] Phone: (336) 832-3150, Fax: (336) 832-3151. Hours of Operation:  8:30 am - 5:30 pm, M-F.  Also accepts Medicaid and self-pay.  °[facility] [place] [address], [place] Phone: (336) 878-6027   °[facility] [address], [place], [place] (336)723-1848, Ext. 123 Mondays & Thursdays: 7-9 AM.  First 15 patients are seen on a first come, first serve basis. °  ° °Medicaid-accepting [place] Providers: ° °Organization         Address  Phone   Notes  °[facility] [address], [place] (336) 641-2100 Also accepts self-pay patients.  °[facility] [address], [place] ° (336) 856-9996   °[facility] [address], [place]  (336) 288-8857   °[facility] [address] [place] [address], [place] (336) 299-7000   °Veita Bland [address], [place]  ° (336) 373-1557 Only accepts [place] Access Medicaid patients after they have their name applied to their card.  ° °Self-Pay (no insurance) in [place]: ° °Organization         Address  Phone   Notes  °Sickle Cell Patients, [facility] [address], [place] (336) 832-1970   °[facility] [address], [place] (336) 832-4400   °[facility] ° [address], [place] (336) 992-4800   °[facility]/Dr. Osei-Bonsu ° [address] [place] [address], [place] or [address], [place] (336) 841-8500 Phone number for both [place] and [place] locations is the same.  °[facility] [address], [place] (336) 299-0000   °[facility] [address] [place] [address], [place] or [address] (336) 852-7530 °(336) 878-2260   °[facility] [address], [place] (336) 350-1642, phone; (336) 294-5005, fax Sees patients 1st and 3rd Saturday of every month.  Must not qualify for public or private insurance (i.e. Medicaid, Medicare, [place] Health Choice, Veterans' Benefits) • Household income should be no more than 200% of the poverty level •The clinic cannot treat you if you are pregnant or think you are pregnant • Sexually transmitted diseases are not treated at the clinic.  ° ° °Dental Care: °Organization         Address  Phone  Notes  °[place] [facility] [address], [place] (336) 641-6152 Accepts children up to age 21 who are enrolled in Medicaid or [place] Health Choice; pregnant women with a Medicaid card; and children who have applied for Medicaid or [place] Health Choice, but were declined, whose parents can pay a reduced fee at time of service.  °[place]  [facility] [place]  [address], [place] (336) 641-7733 Accepts children up to age 21 who are enrolled in Medicaid or [place] Health Choice; pregnant women with a Medicaid card; and children who have applied for Medicaid or [place] Health Choice, but were declined, whose parents can pay a reduced fee at time of service.  °[facility] ° [address], [place] (336) 641-4533 Patients are seen by appointment only. Walk-ins are not accepted. [facility] will see patients 18 years of age and older. °Monday - Tuesday (8am-5pm) °Most Wednesdays (8:30-5pm) °$30 per visit, cash only  °[facility] ° [address], [place] (336) 641-4533 Patients are seen by appointment only. Walk-ins are not accepted. [facility] will see patients 18 years of age and older. °One   Wednesday Evening (Monthly: Volunteer Based).  $30 per visit, cash only  °[facility]  (919) 537-3737 for adults; Children under age 4, call Graduate Pediatric Dentistry at (919) [phone]. Children aged 4-14, please call (919) [phone] to request a pediatric application. ° Dental services are provided in all areas of dental care including fillings, crowns and bridges, complete and partial dentures, implants, gum treatment, root canals, and extractions. Preventive care is also provided. Treatment is provided to both adults and children. °Patients are selected via a lottery and there is often a waiting list. °  °[facility] [address], °[place] ° (336) 763-8833 [email or web address].drcivils.[email or web address] °  °[facility] [address], [place], [place] (336)723-1848, Ext. 123 Second and Fourth Thursday of each month, opens at 6:30 AM; Clinic ends at 9 AM.  Patients are seen on a first-come first-served basis, and a limited number are seen during each clinic.  ° °[facility] ° [address], [place], [place] (336) 723-7904    Eligibility Requirements °You must have lived in [place], [place], or [place] counties for at least the last three months. °  You cannot be eligible for state or federal sponsored healthcare insurance, including Veterans Administration, Medicaid, or Medicare. °  You generally cannot be eligible for healthcare insurance through your employer.  °  How to apply: °Eligibility screenings are held every Tuesday and Wednesday afternoon from 1:00 pm until 4:00 pm. You do not need an appointment for the interview!  °[facility] [address], [place], [place] 336-631-2330   °[facility]  336-342-8273   °[facility]  336-703-3100   °[facility]  336-570-6415   ° °Behavioral Health Resources in the Community: °Intensive Outpatient Programs °Organization         Address  Phone  Notes  °[facility] [address], [place], [place] 336-878-6098   °[facility] [address], [place], [place] 336-832-9800   °[facility] [address], [place], [place] ° 336-882-2125   °[facility] [address],  °[place], [place] 1-800-853-5163 or 336-641-4981   °Substance Abuse Resources °Organization         Address  Phone  Notes  °[facility]  336-882-2125   °[facility]  336-784-9470   °[facility]  336-285-9073   °[facility]  336-845-3988   °Residential & Outpatient Substance Abuse Program  1-800-659-3381   °Psychological Services °Organization         Address  Phone  Notes  °[facility]  336- 832-9600   °[facility]  336- 378-7881   °[facility] [address], [place] 1-800-853-5163 or 336-641-4981   ° °Mobile Crisis Teams °Organization         Address  Phone  Notes  °[facility], Mobile Crisis Care Unit  1-877-626-1772   °Assertive °[facility] ° [address]  [place], [place] 336-834-9664   °Sharon DeEsch [address] °[place] 336-554-5454   ° °Self-Help/Support Groups °Organization         Address  Phone             Notes  °[facility]  - variety of support groups  336- 373-1402 Call for more information  °Narcotics Anonymous (NA), [facility] [address], °[place]  2 meetings at this location  ° °  Residential Treatment Programs Organization         Address  Phone  Notes  [facility] [address]    [place]  2-633-354-5625   [facility]  [address], [place] [address], [place], [place] 342-876-8115   [facility] [address] [place], [place] 726-203-5597 Admissions: 8am-3pm M-F  [facility] [address]    [place], [place] 416-384-5364   [facility] [address] [place], [place], [place] 680-321-2248   [facility] [address],  [place], [place] 250-037-0488   [facility] Intensive Outpatient [address], [place], [place] 891-694-5038   [facility] [address] [place] [address] [place]  [place], [place] 8-828-003-4917 or 410-133-4118   [facility] ([facility] [address] [place], [place] 801-655-3748 Accepts Medicaid  [facility] [address]  [place] 2-707-867-5449 Substance Abuse/Addiction Treatment   [facility]         Address  Phone  Notes  [facility]  978-575-3077   Angie Fava, PhD [address] [place], [place]   747-036-7936 or (563)035-7820   [facility]   [address] [place], [place] (581)201-0973   [facility] [address] [place] [address] [place], [place] (478)312-8875 Insurance/Medicaid/sponsorship through [facility] [address]                                    [place], [place] ([phone]) [phone] Therapy/tele-psych/case    [facility] [address], [place] [phone]    Dr. Lolly Mustache  838-020-8580   [facility]  [facility] Dept[facility] 1) [address] [place] 2) [address], [place] 3)  [address], [place] [phone] ([phone]  6517394157   [facility] Child Abuse Hotline (941)626-5100 or 574-886-3350 (After Hours)      Take the prescription as directed.  Wash the area with soap and water at least twice a day, and cover with a clean/dry dressing.  Change the dressing whenever it becomes wet or soiled after washing the area with soap and water.  Call your regular medical doctor tomorrow to schedule a follow up appointment for a recheck within the next 48 hours.  Return to the Emergency Department immediately if worsening.

## 2014-08-12 NOTE — ED Notes (Signed)
Patient verbalizes understanding of discharge instructions, prescription medications, home care and follow up care. Patient ambulatory out of department at this time with spouse. 

## 2014-08-12 NOTE — ED Notes (Addendum)
PT c/o abscess to RUQ abdomen with no drainage x1 day. Family reports recurrent abscess infections after a cholecystomy in Oct 2016. PT denies any recent fever.

## 2014-08-21 ENCOUNTER — Ambulatory Visit: Payer: Medicare HMO | Admitting: Gastroenterology

## 2014-08-25 ENCOUNTER — Ambulatory Visit: Payer: Medicare HMO | Admitting: Nurse Practitioner

## 2015-01-27 ENCOUNTER — Encounter: Payer: Self-pay | Admitting: Cardiology

## 2015-01-27 ENCOUNTER — Ambulatory Visit (INDEPENDENT_AMBULATORY_CARE_PROVIDER_SITE_OTHER): Payer: Medicare HMO | Admitting: Cardiology

## 2015-01-27 VITALS — BP 104/68 | HR 81 | Ht 68.0 in | Wt 191.0 lb

## 2015-01-27 DIAGNOSIS — Z79899 Other long term (current) drug therapy: Secondary | ICD-10-CM | POA: Diagnosis not present

## 2015-01-27 DIAGNOSIS — I2510 Atherosclerotic heart disease of native coronary artery without angina pectoris: Secondary | ICD-10-CM | POA: Diagnosis not present

## 2015-01-27 DIAGNOSIS — I10 Essential (primary) hypertension: Secondary | ICD-10-CM | POA: Diagnosis not present

## 2015-01-27 DIAGNOSIS — E785 Hyperlipidemia, unspecified: Secondary | ICD-10-CM

## 2015-01-27 DIAGNOSIS — I5022 Chronic systolic (congestive) heart failure: Secondary | ICD-10-CM

## 2015-01-27 NOTE — Progress Notes (Signed)
Patient ID: Kevin Reeves, male   DOB: 09-30-1940, 75 y.o.   MRN: [id number]     Clinical Summary Kevin Reeves is a 74 y.o.male seen today for follow up of the following medical problems.   1. 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 LV gram at that time 40-45% with akinesis of lateral wall. Do not see more recent echo in system - denies any chest pain. No SOB or DOE.   - denies any chest pain. No SOB or DOE - compliant with meds. Statin stopped by GI doctor due to abnormal LFTs, stopped rampiril due to low bp's.  2. HTN - does not check at home - compliant with meds  3. Carotid stenosis - prior right CEA - followed by vascular surgery  4. Hyperlipidemia - off statin per GI for approx 6 months due to elevated LFTs   Past Medical History  Diagnosis Date  . Arteriosclerotic cardiovascular disease (ASCVD)     Stent to RI in 2004. Posterior MI in 4/07 with thrombosis of RI at stent site while off Plavix --> reintervention; do not d/c plavix  . Cerebrovascular disease     Left carotid bruit with 40-59% ICA stenosis; right carotid endarectomy in 2008  . Hypertension   . Colonic polyp 1999    Adenoma resected in 1999; history of diverticulosis  . Gastroesophageal reflux disease     Dilation of esophagel stricture in 2008  . Hyperlipidemia   . Cholelithiasis 07/21/2009    Asymptomatic  . 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 Arthritis   . Coronary artery disease      No Known Allergies   Current Outpatient Prescriptions  Medication Sig Dispense Refill  . aspirin EC 81 MG tablet Take 81 mg by mouth every evening.     . clopidogrel (PLAVIX) 75 MG tablet Take 75 mg by mouth daily.    Marland Kitchen doxycycline (VIBRA-TABS) 100 MG tablet Take 1 tablet (100 mg total) by mouth 2 (two) times daily. 14 tablet 0  . metoprolol succinate (TOPROL-XL) 25 MG 24 hr tablet Take 25 mg by mouth daily.      .  Multiple Vitamin (MULTIVITAMIN) tablet Take 1 tablet by mouth daily.      Marland Kitchen NITROSTAT 0.4 MG SL tablet DISSOLVE 1 TABLET UNDER  TONGUE EVERY 5 MINUTES UPTO 15 MIN FOR CHEST PAIN.IF NO RELIEF CALL 911. 25 each 6  . omeprazole (PRILOSEC) 20 MG capsule Take 20 mg by mouth daily.    . pravastatin (PRAVACHOL) 20 MG tablet Take 1 tablet (20 mg total) by mouth every evening. 90 tablet 0  . ramipril (ALTACE) 10 MG capsule Take 10 mg by mouth daily.       No current facility-administered medications for this visit.     Past Surgical History  Procedure Laterality Date  . Carotid endarterectomy  2008    Right  . Colonoscopy  2008    adenoma resected in 1999; no intervention in 2008  . Cholecystectomy N/A 03/28/2014    Procedure: LAPAROSCOPIC CHOLECYSTECTOMY;  Surgeon: Dalia Heading, MD;  Location: [facility];  Service: General;  Laterality: N/A;  . Cardiac catheterization      cardiac stent placed  . Colonoscopy with esophagogastroduodenoscopy (egd)  Feb 2010    Dr. Jena Gauss: mild erosive reflux esophagitis, non-critical Schatzki's ring without dilation, moderate sized hiatal hernia, bulbar erosions. Negative H.pylori serology. Colonoscopy with normal rectum, pancolonic diverticula,  needs surveillance every 5 years due to history of adenomas.      No Known Allergies    Family History  Problem Relation Age of Onset  . Heart disease Father     Before age 45  . Hyperlipidemia Father   . Hypertension Father   . Stroke Other     Grandparent  . Heart attack Other     Grandparent  . Heart block      Brother  . Heart disease Paternal Grandmother   . Heart disease Paternal Grandfather   . Heart disease Brother   . Heart attack Brother   . Heart disease Brother     Before age 45-  Quad-BPG  . Hyperlipidemia Brother   . Hypertension Brother   . Heart attack Brother   . Colon cancer Neg Hx      Social History Kevin Reeves reports that he has never smoked. He has never used smokeless  tobacco. Kevin Reeves reports that he does not drink alcohol.   Review of Systems CONSTITUTIONAL: No weight loss, fever, chills, weakness or fatigue.  HEENT: Eyes: No visual loss, blurred vision, double vision or yellow sclerae.No hearing loss, sneezing, congestion, runny nose or sore throat.  SKIN: No rash or itching.  CARDIOVASCULAR: per HPI RESPIRATORY: No shortness of breath, cough or sputum.  GASTROINTESTINAL: No anorexia, nausea, vomiting or diarrhea. No abdominal pain or blood.  GENITOURINARY: No burning on urination, no polyuria NEUROLOGICAL: No headache, dizziness, syncope, paralysis, ataxia, numbness or tingling in the extremities. No change in bowel or bladder control.  MUSCULOSKELETAL: No muscle, back pain, joint pain or stiffness.  LYMPHATICS: No enlarged nodes. No history of splenectomy.  PSYCHIATRIC: No history of depression or anxiety.  ENDOCRINOLOGIC: No reports of sweating, cold or heat intolerance. No polyuria or polydipsia.  Marland Kitchen   Physical Examination Filed Vitals:   01/27/15 1059  BP: 104/68  Pulse: 81   Filed Vitals:   01/27/15 1059  Height: 5\' 8"  (1.727 m)  Weight: 191 lb (86.637 kg)    Gen: resting comfortably, no acute distress HEENT: no scleral icterus, pupils equal round and reactive, no palptable cervical adenopathy,  CV: RRR, no m/r/g, no JVD Resp: Clear to auscultation bilaterally GI: abdomen is soft, non-tender, non-distended, normal bowel sounds, no hepatosplenomegaly MSK: extremities are warm, no edema.  Skin: warm, no rash Neuro:  no focal deficits Psych: appropriate affect   Diagnostic Studies  09/2005 Cath RESULTS: The aortic pressure was 122/62 with mean of 89 and left  ventricular pressure was 122/20.  LEFT MAIN CORONARY ARTERY: The left main coronary artery was free of  disease.  LEFT ANTERIOR DESCENDING ARTERY: The left anterior descending artery gave  rise to a large diagonal Kevin Reeves and two septal perforators. There was 70%   narrowing of the proximal LAD and 90% stenosis of the ostium of the  diagonal Kevin Reeves.  CIRCUMFLEX ARTERY: The circumflex artery gave rise to a ramus Kevin Reeves and  two marginal branches and the posterolateral branches. The ramus Nehemias Sauceda was  completed occluded near its ostium within the stent. There was __________  ostial stenosis of the first marginal Shontel Santee.  RIGHT CORONARY ARTERY: The right coronary artery is a moderate size vessel  that gave rise to a right ventricular Kendrick Remigio, posterior descending Odell Choung  and two posterolateral branches. There was 30% narrowing in the mid right  coronary artery.  LEFT VENTRICULOGRAM: The left ventriculogram performed in the RAO  projection showed akinesis of the lateral wall. The estimated ejection  fraction was 40-45%. The left ventriculogram performed in the LAO  projection showed akinesis of the posterior and inferolateral wall. The tip  of the apex and septum moved well and the superior portion of the posterior  wall moved well.  Following aspiration thrombectomy and PTCA of the in-stent thrombotic lesion  in the ramus Hernandez Losasso of the circumflex artery the stenosis improved from 100%  to 0% and the flow improved from TIMI 0 to TIMI 3 flow. Myocardial blush  improved from grade 0 to grade 2.  The patient had the onset of chest pain at 12:35 and arrived at [facility]  emergency room at 12:42. The first ECG was obtained at 12:50 and this was  not diagnostic. The second ECG at 1325 was diagnostic for a posterior  infarction. The patient arrived at the cath lab at [facility] at 1510 and  the reperfusion was established with a diver catheter at 1552. This gave a  __________ time from the time of the diagnostic ECG of 2 hours and 27  minutes and a refusion time of 2 hours and 27 minutes.  CONCLUSION:  1. Acute posterior wall myocardial infarction due to stent thrombosis  within the stent in the ramus Jordi Kamm of circumflex artery  with a total  occlusion of the ramus Verlena Marlette of the circumflex artery, 70% narrowing at  the ostium of the first marginal Dyana Magner of the circumflex artery, 70%  narrowing of the proximal LAD with 90% narrowing in the large diagonal  [place]ah [place], 30% narrowing in the mid right coronary and posterior and  inferolateral wall akinesis with an estimated ejection fraction of 40-  45%.  2. Successful aspiration thrombectomy and PTCA for in-stent thrombosis of  the ramus Charron Coultas of the circumflex artery with improvement in central  narrowing from 100% to 0%, improvement of flow from TIMI 0 to TIMI 3  flow.      Assessment and Plan  1. CAD/ICM - no current symptoms - continue risk factor modifcation and secondary prevention  2. HTN - at goal, continue current meds  3. Carotid stenosis - no current symptoms, continue regular follow up with vascular  4. Hyperlipidemia - off statin due to elevated LFTs, continue to follow with GI. Repeat FLP and CMET, consider zetia pending results.    F/u 1 year      Antoine Poche, M.D.

## 2015-01-27 NOTE — Patient Instructions (Signed)
Your physician wants you to follow-up in: 1 YEAR WITH DR. BRANCH. You will receive a reminder letter in the mail two months in advance. If you don't receive a letter, please call our office to schedule the follow-up appointment.  Your physician recommends that you continue on your current medications as directed. Please refer to the Current Medication list given to you today.  HAVE LAB WORK DONE   Thanks for choosing [facility]!!!

## 2015-01-29 LAB — COMPREHENSIVE METABOLIC PANEL
ALT: 12 U/L (ref 9–46)
AST: 16 U/L (ref 10–35)
Albumin: 3.8 g/dL (ref 3.6–5.1)
Alkaline Phosphatase: 55 U/L (ref 40–115)
BUN: 19 mg/dL (ref 7–25)
CHLORIDE: 104 mmol/L (ref 98–110)
CO2: 27 mmol/L (ref 20–31)
CREATININE: 1.21 mg/dL — AB (ref 0.70–1.18)
Calcium: 9.3 mg/dL (ref 8.6–10.3)
Glucose, Bld: 92 mg/dL (ref 65–99)
POTASSIUM: 4.7 mmol/L (ref 3.5–5.3)
SODIUM: 139 mmol/L (ref 135–146)
TOTAL PROTEIN: 6.9 g/dL (ref 6.1–8.1)
Total Bilirubin: 0.5 mg/dL (ref 0.2–1.2)

## 2015-01-29 LAB — LIPID PANEL
Cholesterol: 194 mg/dL (ref 125–200)
HDL: 35 mg/dL — ABNORMAL LOW (ref >=40)
LDL Cholesterol: 133 mg/dL — ABNORMAL HIGH (ref <130)
TRIGLYCERIDES: 131 mg/dL (ref <150)
Total CHOL/HDL Ratio: 5.5 Ratio — ABNORMAL HIGH (ref <=5.0)
VLDL: 26 mg/dL (ref <30)

## 2015-02-02 ENCOUNTER — Telehealth: Payer: Self-pay

## 2015-02-02 DIAGNOSIS — Z136 Encounter for screening for cardiovascular disorders: Secondary | ICD-10-CM

## 2015-02-02 MED ORDER — EZETIMIBE 10 MG PO TABS: 10.0000 mg | ORAL_TABLET | Freq: Every day | ORAL | Status: DC

## 2015-02-02 NOTE — Telephone Encounter (Signed)
PT MADE AWARE. WILL MAIL LAB SLIP FOR CMET.

## 2015-02-02 NOTE — Addendum Note (Signed)
Addended by: Abelino Derrick R on: 02/02/2015 01:42 PM   Modules accepted: Orders

## 2015-02-02 NOTE — Telephone Encounter (Signed)
-----   Message from Antoine Poche, MD sent at 02/02/2015  1:26 PM EDT ----- Labs overall look good. His liver tests are normal. His cholesterol remains too high. Id like to try him on a different type of cholesterol medicine called zetia 10mg  daily with repeat CMET in 1 month. Zetia has a much lower risk of liver inflammation than the prior cholesterol meds he has tried.    MD

## 2015-03-31 DIAGNOSIS — Z136 Encounter for screening for cardiovascular disorders: Secondary | ICD-10-CM | POA: Diagnosis not present

## 2015-04-01 LAB — COMPREHENSIVE METABOLIC PANEL
ALBUMIN: 3.7 g/dL (ref 3.6–5.1)
ALT: 12 U/L (ref 9–46)
AST: 14 U/L (ref 10–35)
Alkaline Phosphatase: 55 U/L (ref 40–115)
BILIRUBIN TOTAL: 0.4 mg/dL (ref 0.2–1.2)
BUN: 18 mg/dL (ref 7–25)
CALCIUM: 9.3 mg/dL (ref 8.6–10.3)
CO2: 28 mmol/L (ref 20–31)
CREATININE: 1.14 mg/dL (ref 0.70–1.18)
Chloride: 105 mmol/L (ref 98–110)
GLUCOSE: 85 mg/dL (ref 65–99)
Potassium: 4.6 mmol/L (ref 3.5–5.3)
Sodium: 137 mmol/L (ref 135–146)
Total Protein: 6.9 g/dL (ref 6.1–8.1)

## 2015-04-14 ENCOUNTER — Ambulatory Visit: Payer: Medicare HMO | Admitting: Family

## 2015-04-14 ENCOUNTER — Encounter (HOSPITAL_COMMUNITY): Payer: Medicare HMO

## 2015-04-15 DIAGNOSIS — M7502 Adhesive capsulitis of left shoulder: Secondary | ICD-10-CM | POA: Diagnosis not present

## 2015-04-15 DIAGNOSIS — Z23 Encounter for immunization: Secondary | ICD-10-CM | POA: Diagnosis not present

## 2015-04-15 DIAGNOSIS — I2510 Atherosclerotic heart disease of native coronary artery without angina pectoris: Secondary | ICD-10-CM | POA: Diagnosis not present

## 2015-04-15 DIAGNOSIS — M170 Bilateral primary osteoarthritis of knee: Secondary | ICD-10-CM | POA: Diagnosis not present

## 2015-04-15 DIAGNOSIS — Z8249 Family history of ischemic heart disease and other diseases of the circulatory system: Secondary | ICD-10-CM | POA: Diagnosis not present

## 2015-04-26 IMAGING — CT CT ABD-PELV W/ CM
2 of 5 series · 16 of 46 positions shown, 18 images · IV contrast (Omnipaque 300)
Comparison: Abdominal ultrasound earlier today

CLINICAL DATA: Worsening sharp abdominal pain. Cholecystectomy on
03/28/2014.

EXAM:
CT ABDOMEN AND PELVIS WITH CONTRAST
TECHNIQUE: Multidetector CT imaging of the abdomen and pelvis was performed
using the standard protocol following bolus administration of
intravenous contrast.
CONTRAST:  100mL OMNIPAQUE IOHEXOL 300 MG/ML  SOLN

[Series 2: abd_pel_with 5.0 b40f · axial · 0.76mm/px · z∈[-452,-27]mm · 13 of 97 slices shown, 15 images]
[im 6/97  soft-tissue]
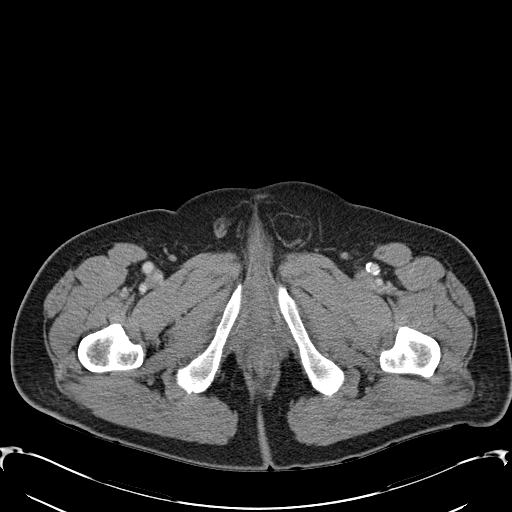
[im 6/97  bone]
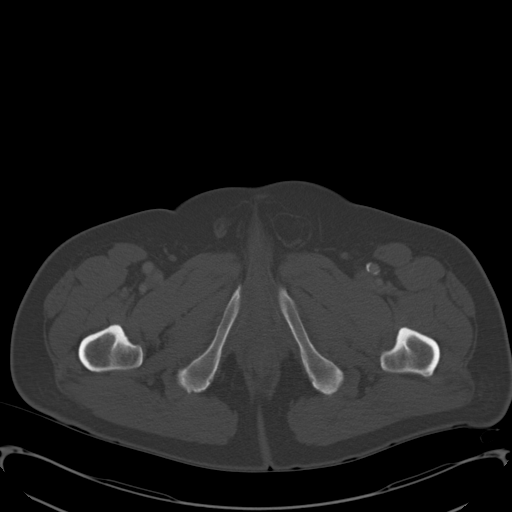
[im 12/97  soft-tissue]
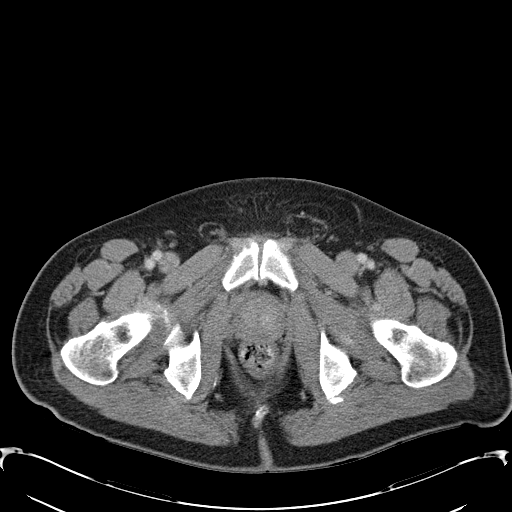
[im 23/97  soft-tissue]
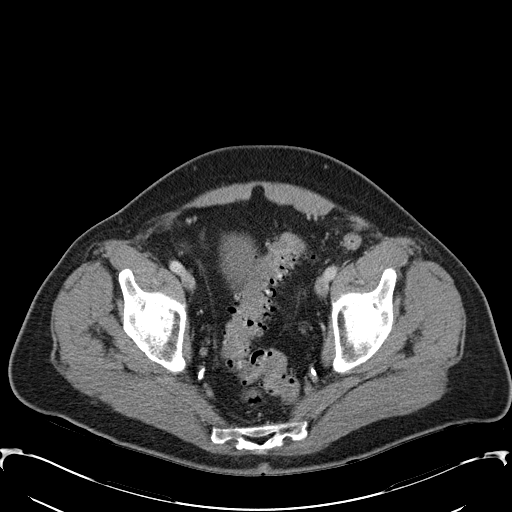
[im 29/97  soft-tissue]
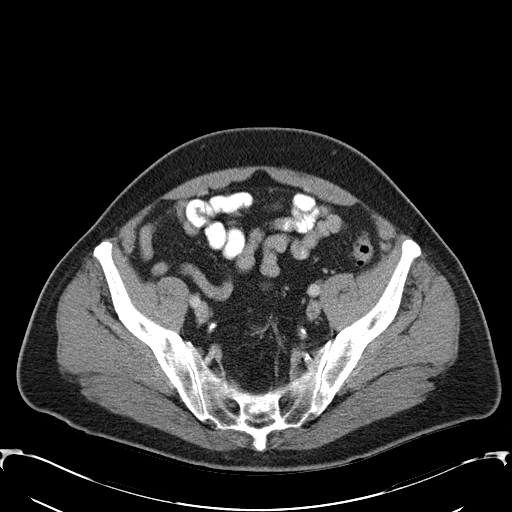
[im 34/97  soft-tissue]
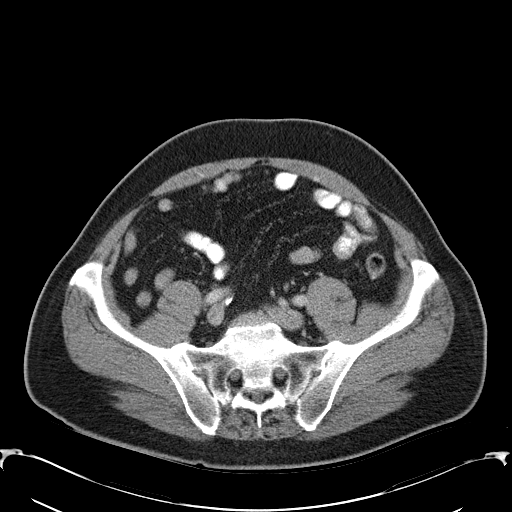
[im 40/97  soft-tissue]
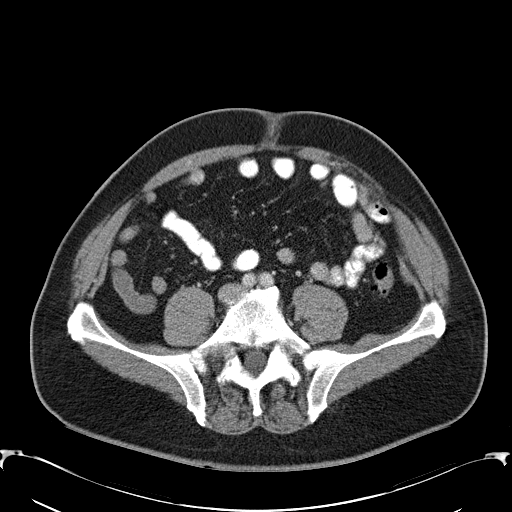
[im 51/97  soft-tissue]
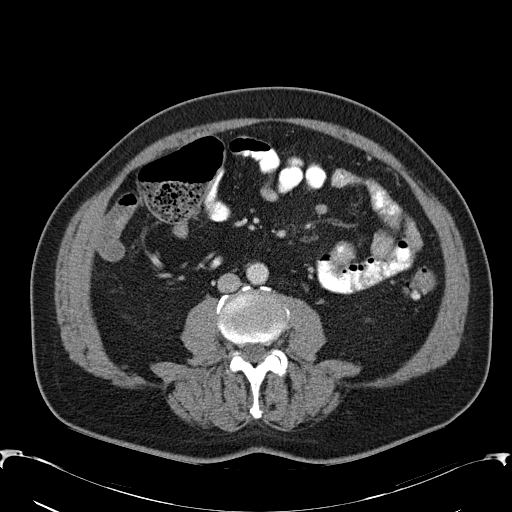
[im 57/97  soft-tissue]
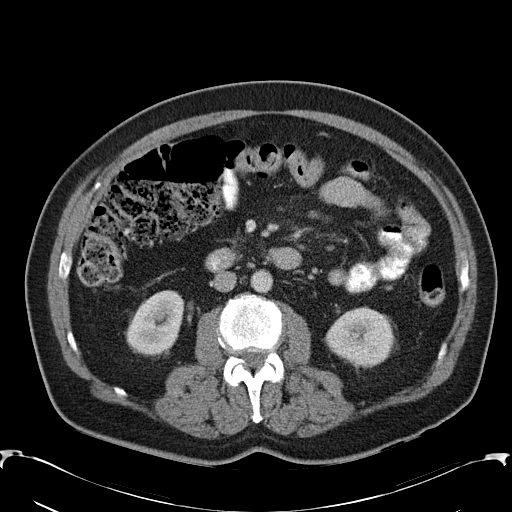
[im 63/97  soft-tissue]
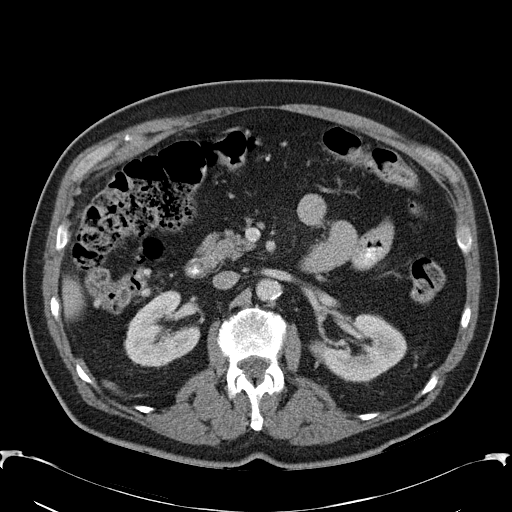
[im 63/97  bone]
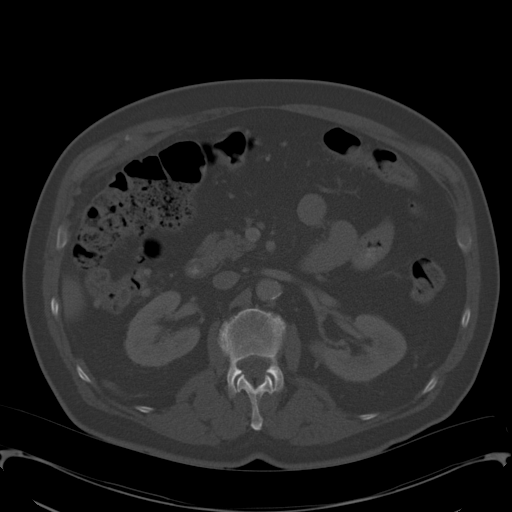
[im 68/97  soft-tissue]
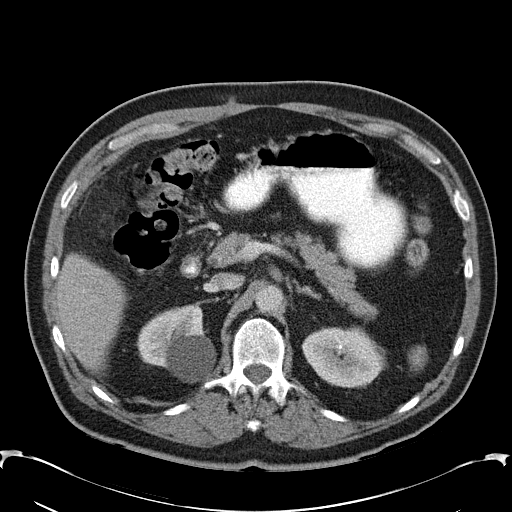
[im 74/97  soft-tissue]
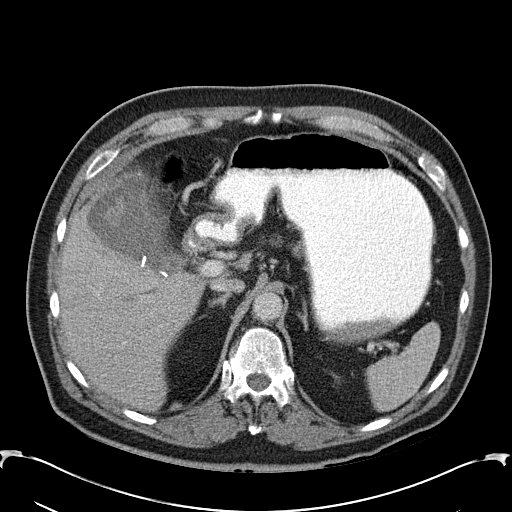
[im 85/97  soft-tissue]
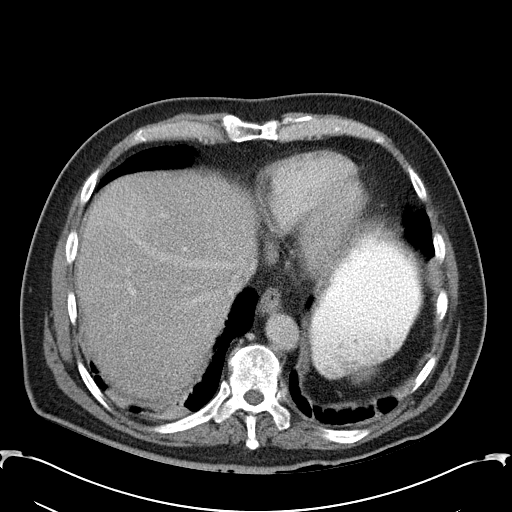
[im 91/97  soft-tissue]
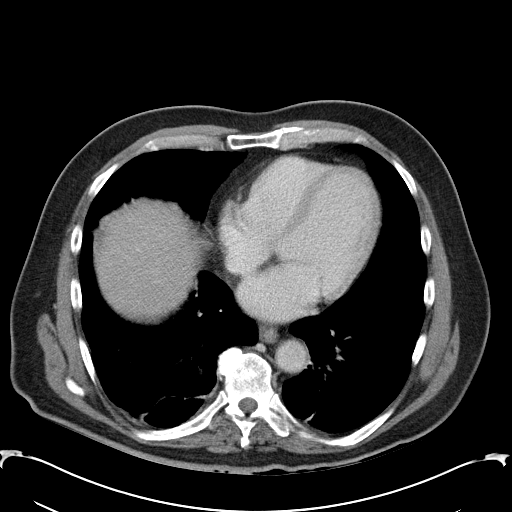

[Series 3: abd_pel_with 3.0 spo cor · coronal · 0.86mm/px · 3 of 95 slices shown]
[im 32/95  soft-tissue]
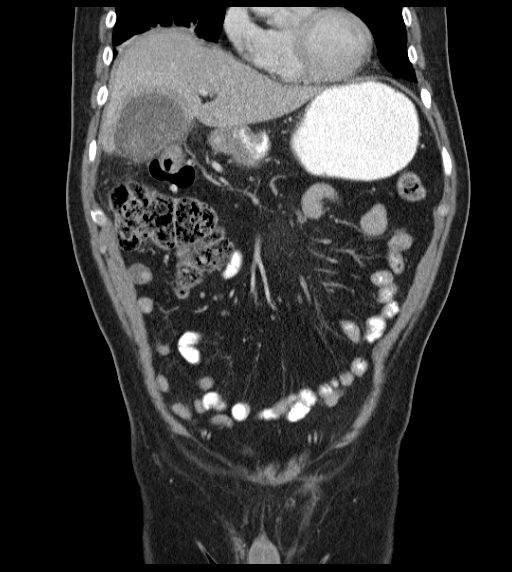
[im 42/95  soft-tissue]
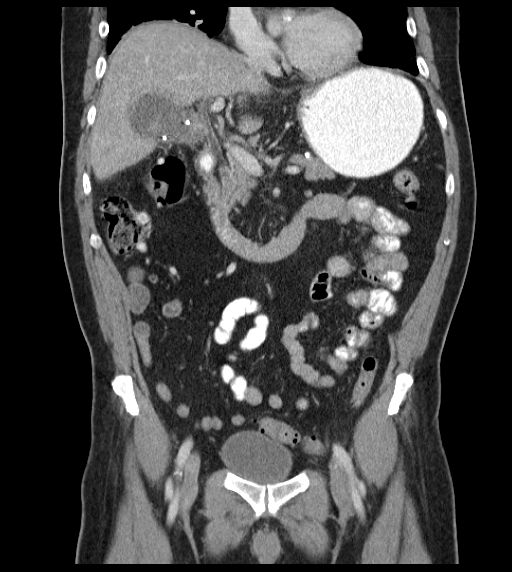
[im 53/95  soft-tissue]
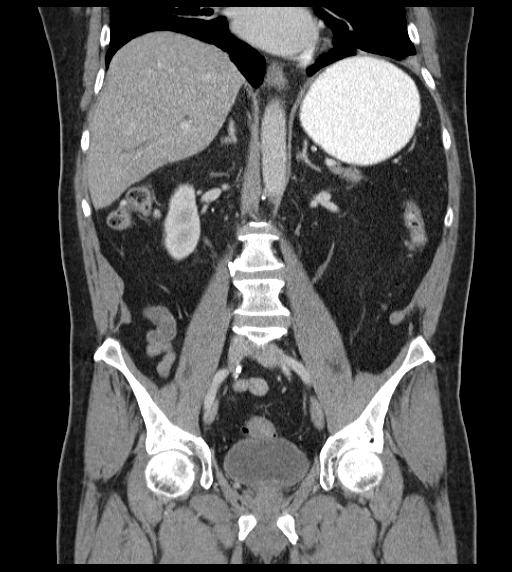

[16 of 46 positions shown; findings below may reference images not displayed]

FINDINGS: Large mixed density complex fluid collection in the gallbladder
fossa measuring 7.2 x 5.5 x 5.5 cm. Differential includes abscess,
hematoma or biloma. There is evidence of a rim of edema in the
adjacent liver surrounding the collection as well as a more focal
cystic area in the adjacent liver measuring approximately 11 mm in
diameter. These findings in the liver are suggestive of inflammation
and potentially suggests that the gallbladder fossa collection may
be infected. Correlation with a nuclear medicine hepatobiliary study
may be helpful to exclude bile leak. No air is identified in the
collection. No additional fluid collection or free fluid is
identified elsewhere in the abdomen.

No evidence of biliary ductal dilatation. Bowel shows no evidence of
obstruction. No free intraperitoneal air. Sigmoid colonic
diverticulosis present. No enlarged lymph nodes. The pancreas,
spleen, adrenal glands and kidneys are unremarkable.

There is a left inguinal hernia containing a short segment of the:
At the juncture of the lower descending colon and proximal sigmoid
colon. No evidence to suggest incarcerated hernia. The bladder is
unremarkable. No bony abnormalities.
IMPRESSION: Complex fluid collection in the gallbladder fossa measuring just
over 7 cm in greatest diameter. Differential includes abscess,
hematoma or biloma. There is a rim of edema in the adjacent liver as
well as a more focal cystic area in the adjacent liver. These
findings suggest adjacent inflammation which favors abscess. No air
is identified in the collection. Nuclear medicine study may be
helpful to exclude bile leak.

## 2015-05-25 ENCOUNTER — Encounter: Payer: Self-pay | Admitting: Family

## 2015-05-27 ENCOUNTER — Ambulatory Visit (INDEPENDENT_AMBULATORY_CARE_PROVIDER_SITE_OTHER): Payer: Medicare HMO | Admitting: Family

## 2015-05-27 ENCOUNTER — Encounter: Payer: Self-pay | Admitting: Family

## 2015-05-27 ENCOUNTER — Ambulatory Visit (HOSPITAL_COMMUNITY)
Admission: RE | Admit: 2015-05-27 | Discharge: 2015-05-27 | Disposition: A | Discharge status: Home / Self Care | Payer: Medicare HMO | Source: Ambulatory Visit | Attending: Vascular Surgery | Admitting: Vascular Surgery

## 2015-05-27 VITALS — BP 117/78 | HR 92 | Temp 97.1°F | Resp 18 | Ht 68.0 in | Wt 184.0 lb

## 2015-05-27 DIAGNOSIS — I10 Essential (primary) hypertension: Secondary | ICD-10-CM | POA: Diagnosis not present

## 2015-05-27 DIAGNOSIS — Z48812 Encounter for surgical aftercare following surgery on the circulatory system: Secondary | ICD-10-CM

## 2015-05-27 DIAGNOSIS — E785 Hyperlipidemia, unspecified: Secondary | ICD-10-CM | POA: Diagnosis not present

## 2015-05-27 DIAGNOSIS — I6521 Occlusion and stenosis of right carotid artery: Secondary | ICD-10-CM

## 2015-05-27 DIAGNOSIS — Z9889 Other specified postprocedural states: Secondary | ICD-10-CM

## 2015-05-27 NOTE — Patient Instructions (Signed)
Stroke Prevention Some medical conditions and behaviors are associated with an increased chance of having a stroke. You may prevent a stroke by making healthy choices and managing medical conditions. HOW CAN I REDUCE MY RISK OF HAVING A STROKE?   Stay physically active. Get at least 30 minutes of activity on most or all days.  Do not smoke. It may also be helpful to avoid exposure to secondhand smoke.  Limit alcohol use. Moderate alcohol use is considered to be:  No more than 2 drinks per day for men.  No more than 1 drink per day for nonpregnant women.  Eat healthy foods. This involves:  Eating 5 or more servings of fruits and vegetables a day.  Making dietary changes that address high blood pressure (hypertension), high cholesterol, diabetes, or obesity.  Manage your cholesterol levels.  Making food choices that are high in fiber and low in saturated fat, trans fat, and cholesterol may control cholesterol levels.  Take any prescribed medicines to control cholesterol as directed by your health care provider.  Manage your diabetes.  Controlling your carbohydrate and sugar intake is recommended to manage diabetes.  Take any prescribed medicines to control diabetes as directed by your health care provider.  Control your hypertension.  Making food choices that are low in salt (sodium), saturated fat, trans fat, and cholesterol is recommended to manage hypertension.  Ask your health care provider if you need treatment to lower your blood pressure. Take any prescribed medicines to control hypertension as directed by your health care provider.  If you are 18-39 years of age, have your blood pressure checked every 3-5 years. If you are 40 years of age or older, have your blood pressure checked every year.  Maintain a healthy weight.  Reducing calorie intake and making food choices that are low in sodium, saturated fat, trans fat, and cholesterol are recommended to manage  weight.  Stop drug abuse.  Avoid taking birth control pills.  Talk to your health care provider about the risks of taking birth control pills if you are over 35 years old, smoke, get migraines, or have ever had a blood clot.  Get evaluated for sleep disorders (sleep apnea).  Talk to your health care provider about getting a sleep evaluation if you snore a lot or have excessive sleepiness.  Take medicines only as directed by your health care provider.  For some people, aspirin or blood thinners (anticoagulants) are helpful in reducing the risk of forming abnormal blood clots that can lead to stroke. If you have the irregular heart rhythm of atrial fibrillation, you should be on a blood thinner unless there is a good reason you cannot take them.  Understand all your medicine instructions.  Make sure that other conditions (such as anemia or atherosclerosis) are addressed. SEEK IMMEDIATE MEDICAL CARE IF:   You have sudden weakness or numbness of the face, arm, or leg, especially on one side of the body.  Your face or eyelid droops to one side.  You have sudden confusion.  You have trouble speaking (aphasia) or understanding.  You have sudden trouble seeing in one or both eyes.  You have sudden trouble walking.  You have dizziness.  You have a loss of balance or coordination.  You have a sudden, severe headache with no known cause.  You have new chest pain or an irregular heartbeat. Any of these symptoms may represent a serious problem that is an emergency. Do not wait to see if the symptoms will   go away. Get medical help at once. Call your local emergency services (911 in [place] Do not drive yourself to the hospital.   This information is not intended to replace advice given to you by your health care provider. Make sure you discuss any questions you have with your health care provider.   Document Released: 07/21/2004 Document Revised: 07/04/2014 Document Reviewed:  12/14/2012 Elsevier Interactive Patient Education 2016 [employer]  

## 2015-05-27 NOTE — Progress Notes (Signed)
Chief Complaint: Carotid Artery Stenosis   History of Present Illness  Kevin Reeves is a 74 y.o. male patient of Dr. Hart Rochester who is status post right CEA in October 2007 and returns today for follow up.  The patient has no history of TIA or stroke symptoms, specifically the patient denies a history of amaurosis fugax or monocular blindness, denies a history unilateral of facial drooping, denies a history of hemiplegia, and denies a history of receptive or expressive aphasia.   He denies claudication symptoms in legs with walking.  The patient reports New Medical or Surgical History: pt and wife states he has worsening trouble walking, states he pain in his joints, denies radiculopathy type pain, this is under evaluation per his wife. Cholecystectomy, March 28, 2014. He farmed and raised tobacco from childhood until 1986, had tobacco exposure in this fashion, but never did smoke.  Pt Diabetic: No Pt smoker: non-smoker  Pt meds include: Statin : No: pravastatin was stopped by his PCP due to liver being inflamed from his gall bladder; pt and wife states his cholesterol is normal ASA: Yes Other anticoagulants/antiplatelets: Plavix    Past Medical History  Diagnosis Date  . Arteriosclerotic cardiovascular disease (ASCVD)     Stent to RI in 2004. Posterior MI in 4/07 with thrombosis of RI at stent site while off Plavix --> reintervention; do not d/c plavix  . Cerebrovascular disease     Left carotid bruit with 40-59% ICA stenosis; right carotid endarectomy in 2008  . Hypertension   . Colonic polyp 1999    Adenoma resected in 1999; history of diverticulosis  . Gastroesophageal reflux disease     Dilation of esophagel stricture in 2008  . Hyperlipidemia   . Cholelithiasis 07/21/2009    Asymptomatic  . Myocardial infarction (HCC) 09/2005  . Arthritis   . Coronary artery disease     Social History Social History  Substance Use Topics  . Smoking status: Never Smoker   .  Smokeless tobacco: Never Used  . Alcohol Use: No    Family History Family History  Problem Relation Age of Onset  . Heart disease Father     Before age 16  . Hyperlipidemia Father   . Hypertension Father   . Stroke Other     Grandparent  . Heart attack Other     Grandparent  . Heart block      Brother  . Heart disease Paternal Grandmother   . Heart disease Paternal Grandfather   . Heart disease Brother   . Heart attack Brother   . Heart disease Brother     Before age 87-  Quad-BPG  . Hyperlipidemia Brother   . Hypertension Brother   . Heart attack Brother   . Colon cancer Neg Hx     Surgical History Past Surgical History  Procedure Laterality Date  . Carotid endarterectomy  2008    Right  . Colonoscopy  2008    adenoma resected in 1999; no intervention in 2008  . Cholecystectomy N/A 03/28/2014    Procedure: LAPAROSCOPIC CHOLECYSTECTOMY;  Surgeon: Dalia Heading, MD;  Location: [facility];  Service: General;  Laterality: N/A;  . Cardiac catheterization      cardiac stent placed  . Colonoscopy with esophagogastroduodenoscopy (egd)  Feb 2010    Dr. Jena Gauss: mild erosive reflux esophagitis, non-critical Schatzki's ring without dilation, moderate sized hiatal hernia, bulbar erosions. Negative H.pylori serology. Colonoscopy with normal rectum, pancolonic diverticula, needs surveillance every 5 years due to history  of adenomas.     No Known Allergies  Current Outpatient Prescriptions  Medication Sig Dispense Refill  . aspirin EC 81 MG tablet Take 81 mg by mouth every evening.     . clopidogrel (PLAVIX) 75 MG tablet Take 75 mg by mouth daily.    Marland Kitchen ezetimibe (ZETIA) 10 MG tablet Take 1 tablet (10 mg total) by mouth daily. 90 tablet 3  . metoprolol succinate (TOPROL-XL) 25 MG 24 hr tablet Take 25 mg by mouth daily.      . Multiple Vitamin (MULTIVITAMIN) tablet Take 1 tablet by mouth daily.      Marland Kitchen NITROSTAT 0.4 MG SL tablet DISSOLVE 1 TABLET UNDER  TONGUE EVERY 5 MINUTES UPTO 15  MIN FOR CHEST PAIN.IF NO RELIEF CALL 911. 25 each 6  . omeprazole (PRILOSEC) 20 MG capsule Take 20 mg by mouth daily.     No current facility-administered medications for this visit.    Review of Systems : See HPI for pertinent positives and negatives.  Physical Examination  Filed Vitals:   05/27/15 0848 05/27/15 0849  BP: 116/87 117/78  Pulse: 98 92  Temp: 97.1 F (36.2 C)   Resp: 18   Height: 5\' 8"  (1.727 m)   Weight: 184 lb (83.462 kg)   SpO2: 98%    Body mass index is 27.98 kg/(m^2).   General: WDWN male in NAD GAIT: stiff, slow Eyes: PERRLA Pulmonary: Non-labored, CTAB, no rales, rhonchi, or wheezing.  Cardiac: regular rhythm,no detected murmur.  VASCULAR EXAM Carotid Bruits Right Left   Negative Negative   Radial pulses are 2+ palpable and equal.      LE Pulses Right Left   POPLITEAL not palpable  not palpable   POSTERIOR TIBIAL not palpable  not palpable    DORSALIS PEDIS  ANTERIOR TIBIAL not palpable  not palpable     Gastrointestinal: soft, nontender, BS WNL, no r/g, no masses palpated.  Musculoskeletal: No muscle atrophy/wasting. M/S 5/5 throughout, extremities without ischemic changes. Feet are warm, pink, with brisk capillary refill.  Neurologic: A&O X 3; Appropriate Affect, sensation is normal, Speech is normal CN 2-12 intact, Pain and light touch intact in extremities, Motor exam as listed above.        Non-Invasive Vascular Imaging CAROTID DUPLEX 05/27/2015   CEREBROVASCULAR DUPLEX EVALUATION    INDICATION: Carotid artery disease     PREVIOUS INTERVENTION(S): Right carotid endarterectomy 04/17/2006.    DUPLEX EXAM:     RIGHT  LEFT  Peak Systolic Velocities (cm/s) End Diastolic Velocities (cm/s) Plaque LOCATION Peak Systolic Velocities (cm/s) End Diastolic  Velocities (cm/s) Plaque  86 25  CCA PROXIMAL 82 29   77 25  CCA MID 90 36   78 25 HM CCA DISTAL 90 37 HT  105 26 HM ECA 284 82 HT  67 24 HM ICA PROXIMAL 140 56 HT  108 40  ICA MID 139 47 HT  87 30  ICA DISTAL 93 38     NA ICA / CCA Ratio (PSV) 1.55  Antegrade  Vertebral Flow Antegrade    Brachial Systolic Pressure (mmHg)   Multiphasic (Subclavian artery) Brachial Artery Waveforms Multiphasic (Subclavian artery)    Plaque Morphology:  HM = Homogeneous, HT = Heterogeneous, CP = Calcific Plaque, SP = Smooth Plaque, IP = Irregular Plaque  ADDITIONAL FINDINGS:     IMPRESSION: Patent right carotid endarterectomy site with evidence of mild hyperplasia with velocities remaining <40%. Left internal carotid artery velocities suggest a 40-59% stenosis.  Compared to the previous exam:  No significant change in comparison to the last exam on 04/09/2014.      Assessment: Kevin Reeves is a 74 y.o. male who has no history of stroke or TIA but does have a history of CAD.  Carotid duplex today suggests a patent right carotid endarterectomy site with evidence of mild hyperplasia with velocities remaining <40%. Left internal carotid artery velocities suggest a 40-59% stenosis. No significant change in comparison to the last exam on 04/09/2014.    Plan: Follow-up in 1 year with Carotid Duplex scan.   I discussed in depth with the patient the nature of atherosclerosis, and emphasized the importance of maximal medical management including strict control of blood pressure, blood glucose, and lipid levels, obtaining regular exercise, and continued cessation of smoking.  The patient is aware that without maximal medical management the underlying atherosclerotic disease process will progress, limiting the benefit of any interventions. The patient was given information about stroke prevention and what symptoms should prompt the patient to seek immediate medical care. Thank you for allowing [place] to  participate in this patient's care.  Charisse March, RN, MSN, FNP-C [facility] Office: [phone]  Clinic Physician: Edilia Bo  05/27/2015 8:41 AM

## 2015-06-02 DIAGNOSIS — Z008 Encounter for other general examination: Secondary | ICD-10-CM | POA: Diagnosis not present

## 2015-06-04 DIAGNOSIS — Z7901 Long term (current) use of anticoagulants: Secondary | ICD-10-CM | POA: Diagnosis not present

## 2015-06-04 DIAGNOSIS — Z8249 Family history of ischemic heart disease and other diseases of the circulatory system: Secondary | ICD-10-CM | POA: Diagnosis not present

## 2015-06-04 DIAGNOSIS — I2510 Atherosclerotic heart disease of native coronary artery without angina pectoris: Secondary | ICD-10-CM | POA: Diagnosis not present

## 2015-06-04 DIAGNOSIS — R5383 Other fatigue: Secondary | ICD-10-CM | POA: Diagnosis not present

## 2015-06-04 DIAGNOSIS — I10 Essential (primary) hypertension: Secondary | ICD-10-CM | POA: Diagnosis not present

## 2015-06-04 DIAGNOSIS — Z125 Encounter for screening for malignant neoplasm of prostate: Secondary | ICD-10-CM | POA: Diagnosis not present

## 2015-06-04 DIAGNOSIS — E785 Hyperlipidemia, unspecified: Secondary | ICD-10-CM | POA: Diagnosis not present

## 2015-06-10 ENCOUNTER — Encounter (INDEPENDENT_AMBULATORY_CARE_PROVIDER_SITE_OTHER): Payer: Self-pay | Admitting: *Deleted

## 2015-12-01 DIAGNOSIS — E785 Hyperlipidemia, unspecified: Secondary | ICD-10-CM | POA: Diagnosis not present

## 2015-12-01 DIAGNOSIS — I10 Essential (primary) hypertension: Secondary | ICD-10-CM | POA: Diagnosis not present

## 2015-12-01 DIAGNOSIS — M7501 Adhesive capsulitis of right shoulder: Secondary | ICD-10-CM | POA: Diagnosis not present

## 2015-12-01 DIAGNOSIS — M7502 Adhesive capsulitis of left shoulder: Secondary | ICD-10-CM | POA: Diagnosis not present

## 2016-02-05 DIAGNOSIS — H5200 Hypermetropia, unspecified eye: Secondary | ICD-10-CM | POA: Diagnosis not present

## 2016-02-05 DIAGNOSIS — Z0100 Encounter for examination of eyes and vision without abnormal findings: Secondary | ICD-10-CM | POA: Diagnosis not present

## 2016-02-05 DIAGNOSIS — H2510 Age-related nuclear cataract, unspecified eye: Secondary | ICD-10-CM | POA: Diagnosis not present

## 2016-02-09 ENCOUNTER — Encounter: Payer: Self-pay | Admitting: Cardiology

## 2016-03-08 ENCOUNTER — Ambulatory Visit: Payer: Medicare HMO | Admitting: Cardiology

## 2016-03-15 DIAGNOSIS — I10 Essential (primary) hypertension: Secondary | ICD-10-CM | POA: Diagnosis not present

## 2016-03-15 DIAGNOSIS — E785 Hyperlipidemia, unspecified: Secondary | ICD-10-CM | POA: Diagnosis not present

## 2016-03-15 DIAGNOSIS — I259 Chronic ischemic heart disease, unspecified: Secondary | ICD-10-CM | POA: Diagnosis not present

## 2016-03-15 DIAGNOSIS — Z0000 Encounter for general adult medical examination without abnormal findings: Secondary | ICD-10-CM | POA: Diagnosis not present

## 2016-03-15 DIAGNOSIS — K219 Gastro-esophageal reflux disease without esophagitis: Secondary | ICD-10-CM | POA: Diagnosis not present

## 2016-04-06 DIAGNOSIS — R69 Illness, unspecified: Secondary | ICD-10-CM | POA: Diagnosis not present

## 2016-04-13 ENCOUNTER — Encounter: Payer: Self-pay | Admitting: Cardiology

## 2016-04-13 ENCOUNTER — Ambulatory Visit (INDEPENDENT_AMBULATORY_CARE_PROVIDER_SITE_OTHER): Payer: Medicare HMO | Admitting: Cardiology

## 2016-04-13 VITALS — BP 114/68 | HR 82 | Ht 68.0 in | Wt 195.0 lb

## 2016-04-13 DIAGNOSIS — I10 Essential (primary) hypertension: Secondary | ICD-10-CM

## 2016-04-13 DIAGNOSIS — I2510 Atherosclerotic heart disease of native coronary artery without angina pectoris: Secondary | ICD-10-CM

## 2016-04-13 DIAGNOSIS — I5022 Chronic systolic (congestive) heart failure: Secondary | ICD-10-CM

## 2016-04-13 DIAGNOSIS — R0602 Shortness of breath: Secondary | ICD-10-CM | POA: Diagnosis not present

## 2016-04-13 DIAGNOSIS — E782 Mixed hyperlipidemia: Secondary | ICD-10-CM

## 2016-04-13 NOTE — Patient Instructions (Signed)
Medication Instructions:  Your physician recommends that you continue on your current medications as directed. Please refer to the Current Medication list given to you today.   Labwork: I WILL REQUEST LABS FROM PCP  Testing/Procedures: Your physician has requested that you have an echocardiogram. Echocardiography is a painless test that uses sound waves to create images of your heart. It provides your doctor with information about the size and shape of your heart and how well your heart's chambers and valves are working. This procedure takes approximately one hour. There are no restrictions for this procedure.    Follow-Up: Your physician wants you to follow-up in: 1 YEAR.  You will receive a reminder letter in the mail two months in advance. If you don't receive a letter, please call our office to schedule the follow-up appointment.   Any Other Special Instructions Will Be Listed Below (If Applicable).     If you need a refill on your cardiac medications before your next appointment, please call your pharmacy.

## 2016-04-13 NOTE — Progress Notes (Addendum)
Clinical Summary Kevin Reeves is a 75 y.o.male seen today for follow up of the following medical problems.   1. 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 LV gram at that time 40-45% with akinesis of lateral wall.  - Statin stopped by GI doctor due to abnormal LFTs, stopped rampiril due to low bp's.  - no recent chest pain. Can have some SOB/DOE at 3-4 blocks which is mildly worst than baseline. No recent LE edema   2. HTN - does not check at home - he is compliant with meds  3. Carotid stenosis - prior right CEA - followed by vascular surgery  4. Hyperlipidemia - off statin per GI  due to elevated LFTs    SH: works [employer].  Past Medical History:  Diagnosis Date  . Arteriosclerotic cardiovascular disease (ASCVD)    Stent to RI in 2004. Posterior MI in 4/07 with thrombosis of RI at stent site while off Plavix --> reintervention; do not d/c plavix  . Arthritis   . Cerebrovascular disease    Left carotid bruit with 40-59% ICA stenosis; right carotid endarectomy in 2008  . Cholelithiasis 07/21/2009   Asymptomatic  . Colonic polyp 1999   Adenoma resected in 1999; history of diverticulosis  . Coronary artery disease   . Gastroesophageal reflux disease    Dilation of esophagel stricture in 2008  . Hyperlipidemia   . Hypertension   . Myocardial infarction 09/2005     No Known Allergies   Current Outpatient Prescriptions  Medication Sig Dispense Refill  . acetaminophen (TYLENOL) 500 MG tablet Take 500 mg by mouth 2 (two) times daily.    Marland Kitchen aspirin EC 81 MG tablet Take 81 mg by mouth every evening.     . clopidogrel (PLAVIX) 75 MG tablet Take 75 mg by mouth daily.    Marland Kitchen ezetimibe (ZETIA) 10 MG tablet Take 1 tablet (10 mg total) by mouth daily. (Patient not taking: Reported on 05/27/2015) 90 tablet 3  . KRILL OIL PO Take by mouth daily.    .  metoprolol succinate (TOPROL-XL) 25 MG 24 hr tablet Take 25 mg by mouth daily.      . Multiple Vitamin (MULTIVITAMIN) tablet Take 1 tablet by mouth daily.      Marland Kitchen NITROSTAT 0.4 MG SL tablet DISSOLVE 1 TABLET UNDER  TONGUE EVERY 5 MINUTES UPTO 15 MIN FOR CHEST PAIN.IF NO RELIEF CALL 911. 25 each 6  . omeprazole (PRILOSEC) 20 MG capsule Take 20 mg by mouth daily.     No current facility-administered medications for this visit.      Past Surgical History:  Procedure Laterality Date  . CARDIAC CATHETERIZATION     cardiac stent placed  . CAROTID ENDARTERECTOMY  2008   Right  . CHOLECYSTECTOMY N/A 03/28/2014   Procedure: LAPAROSCOPIC CHOLECYSTECTOMY;  Surgeon: Dalia Heading, MD;  Location: [facility];  Service: General;  Laterality: N/A;  . COLONOSCOPY  2008   adenoma resected in 1999; no intervention in 2008  . COLONOSCOPY WITH ESOPHAGOGASTRODUODENOSCOPY (EGD)  Feb 2010   Dr. Jena Gauss: mild erosive reflux esophagitis, non-critical Schatzki's ring without dilation, moderate sized hiatal hernia, bulbar erosions. Negative H.pylori serology. Colonoscopy with normal rectum, pancolonic diverticula, needs surveillance every 5 years due to history of adenomas.      No Known Allergies    Family History  Problem Relation Age of Onset  .  Heart disease Father     Before age 41  . Hyperlipidemia Father   . Hypertension Father   . Stroke Other     Grandparent  . Heart attack Other     Grandparent  . Heart block      Brother  . Heart disease Paternal Grandmother   . Heart disease Paternal Grandfather   . Heart disease Brother   . Heart attack Brother   . Heart disease Brother     Before age 63-  Quad-BPG  . Hyperlipidemia Brother   . Hypertension Brother   . Heart attack Brother   . Colon cancer Neg Hx      Social History Kevin Reeves reports that he has never smoked. He has never used smokeless tobacco. Kevin Reeves reports that he does not drink alcohol.   Review of  Systems CONSTITUTIONAL: No weight loss, fever, chills, weakness or fatigue.  HEENT: Eyes: No visual loss, blurred vision, double vision or yellow sclerae.No hearing loss, sneezing, congestion, runny nose or sore throat.  SKIN: No rash or itching.  CARDIOVASCULAR: per hpi RESPIRATORY: No shortness of breath, cough or sputum.  GASTROINTESTINAL: No anorexia, nausea, vomiting or diarrhea. No abdominal pain or blood.  GENITOURINARY: No burning on urination, no polyuria NEUROLOGICAL: No headache, dizziness, syncope, paralysis, ataxia, numbness or tingling in the extremities. No change in bowel or bladder control.  MUSCULOSKELETAL: No muscle, back pain, joint pain or stiffness.  LYMPHATICS: No enlarged nodes. No history of splenectomy.  PSYCHIATRIC: No history of depression or anxiety.  ENDOCRINOLOGIC: No reports of sweating, cold or heat intolerance. No polyuria or polydipsia.  Marland Kitchen   Physical Examination Vitals:   04/13/16 0924  BP: 114/68  Pulse: 82   Vitals:   04/13/16 0924  Weight: 195 lb (88.5 kg)  Height: 5\' 8"  (1.727 m)    Gen: resting comfortably, no acute distress HEENT: no scleral icterus, pupils equal round and reactive, no palptable cervical adenopathy,  CV: RRR, no m/r/g, no jvd Resp: Clear to auscultation bilaterally GI: abdomen is soft, non-tender, non-distended, normal bowel sounds, no hepatosplenomegaly MSK: extremities are warm, no edema.  Skin: warm, no rash Neuro:  no focal deficits Psych: appropriate affect   Diagnostic Studies 09/2005 Cath RESULTS: The aortic pressure was 122/62 with mean of 89 and left  ventricular pressure was 122/20.  LEFT MAIN CORONARY ARTERY: The left main coronary artery was free of  disease.  LEFT ANTERIOR DESCENDING ARTERY: The left anterior descending artery gave  rise to a large diagonal Timberlynn Kizziah and two septal perforators. There was 70%  narrowing of the proximal LAD and 90% stenosis of the ostium of the  diagonal Pessy Delamar.   CIRCUMFLEX ARTERY: The circumflex artery gave rise to a ramus Candence Sease and  two marginal branches and the posterolateral branches. The ramus Houa Nie was  completed occluded near its ostium within the stent. There was __________  ostial stenosis of the first marginal Kitara Hebb.  RIGHT CORONARY ARTERY: The right coronary artery is a moderate size vessel  that gave rise to a right ventricular Edris Friedt, posterior descending Holten Spano  and two posterolateral branches. There was 30% narrowing in the mid right  coronary artery.  LEFT VENTRICULOGRAM: The left ventriculogram performed in the RAO  projection showed akinesis of the lateral wall. The estimated ejection  fraction was 40-45%. The left ventriculogram performed in the LAO  projection showed akinesis of the posterior and inferolateral wall. The tip  of the apex and septum moved well and the superior  portion of the posterior  wall moved well.  Following aspiration thrombectomy and PTCA of the in-stent thrombotic lesion  in the ramus Hector Venne of the circumflex artery the stenosis improved from 100%  to 0% and the flow improved from TIMI 0 to TIMI 3 flow. Myocardial blush  improved from grade 0 to grade 2.  The patient had the onset of chest pain at 12:35 and [facility] at 12:42. The first ECG was obtained at 12:50 and this was  not diagnostic. The second ECG at 1325 was diagnostic for a posterior  infarction. The patient arrived at the cath lab [facility] at 1510 and  the reperfusion was established with a diver catheter at 1552. This gave a  __________ time from the time of the diagnostic ECG of 2 hours and 27  minutes and a refusion time of 2 hours and 27 minutes.  CONCLUSION:  1. Acute posterior wall myocardial infarction due to stent thrombosis  within the stent in the ramus Aaliyah Gavel of circumflex artery with a total  occlusion of the ramus Alida Greiner of the circumflex artery, 70% narrowing at   the ostium of the first marginal Cheryll Keisler of the circumflex artery, 70%  narrowing of the proximal LAD with 90% narrowing in the large diagonal  Jafeth Mustin, 30% narrowing in the mid right coronary and posterior and  inferolateral wall akinesis with an estimated ejection fraction of 40-  45%.  2. Successful aspiration thrombectomy and PTCA for in-stent thrombosis of  the ramus Viktoriya Glaspy of the circumflex artery with improvement in central  narrowing from 100% to 0%, improvement of flow from TIMI 0 to TIMI 3  flow.     Assessment and Plan  1. CAD/ICM - mild increase in SOB. Several years since last echo, we will repeat - continue current meds  2. HTN - he is at goal, continue current meds  3. Carotid stenosis - continue regular follow up with vascular  4. Hyperlipidemia - will touch base with GI about restarting statin, perhaps low dose pravastatin.    F/u 1 year. Request pcp labs.    Antoine PocheJonathan F. Ron Junco, M.D.   04/15/16 Addendum Heard back from GI, ok with retrying statin. Recommend CMET now as baseline, if normal then start low dose pravaststin with f/u CMET in 1 month  Dominga FerryJ Joee Iovine MD

## 2016-04-15 ENCOUNTER — Telehealth: Payer: Self-pay

## 2016-04-15 DIAGNOSIS — Z79899 Other long term (current) drug therapy: Secondary | ICD-10-CM

## 2016-04-15 NOTE — Telephone Encounter (Signed)
Called pt, left message for pt to get labs done so that we can start back on statin medication per Dr. Wyline Mood.

## 2016-04-19 ENCOUNTER — Telehealth: Payer: Self-pay

## 2016-04-19 NOTE — Telephone Encounter (Addendum)
-----   Message from Staci T Ashworth, CMA sent at 04/15/2016  2:11 PM EDT -----   ----- Message ----- From: Jonathan F Branch, MD Sent: 04/15/2016  11:32 AM To: Staci T Ashworth, CMA  Please let patient know I spoke with GI and they are ok with trying a statin again. First he needs a CMET, once we have those results if liver still normal we will start a new statin   J Branch MD ----- Message ----- From: Eric A Gill, NP Sent: 04/15/2016  11:11 AM To: Jonathan F Branch, MD  His last two CMPs were normal. I'm ok with trying a challenge with milder statin. I'd recommend a CMP prior to initiating and repeat 1 month after initiating. If LFTs elevate again then we'll at least know he's pretty much intolerant. We could see him at some point as well if you like.  Let me know if there's anything we can do to help! Eric Gill, NP-C  ----- Message ----- From: Jonathan F Branch, MD Sent: 04/15/2016   9:44 AM To: Jonathan F Branch, MD, Eric A Gill, NP  This is a mutual patient you had seen for elevated LFTs. His statin has been held since that time. What are your thoughts about restarting a statin, perhaps a milder one like pravastatin for him?   Jonathan Branch MD   

## 2016-04-19 NOTE — Telephone Encounter (Signed)
I left a detailed message per ok by pt on phone to get BMET and then if of lft's,GI was ok with starting him back on statin

## 2016-04-22 ENCOUNTER — Ambulatory Visit (HOSPITAL_COMMUNITY)
Admission: RE | Admit: 2016-04-22 | Discharge: 2016-04-22 | Disposition: A | Discharge status: Home / Self Care | Payer: Medicare HMO | Source: Ambulatory Visit | Attending: Cardiology | Admitting: Cardiology

## 2016-04-22 DIAGNOSIS — E785 Hyperlipidemia, unspecified: Secondary | ICD-10-CM | POA: Diagnosis not present

## 2016-04-22 DIAGNOSIS — K219 Gastro-esophageal reflux disease without esophagitis: Secondary | ICD-10-CM | POA: Diagnosis not present

## 2016-04-22 DIAGNOSIS — R0602 Shortness of breath: Secondary | ICD-10-CM | POA: Diagnosis not present

## 2016-04-22 DIAGNOSIS — I2510 Atherosclerotic heart disease of native coronary artery without angina pectoris: Secondary | ICD-10-CM | POA: Insufficient documentation

## 2016-04-22 DIAGNOSIS — I6529 Occlusion and stenosis of unspecified carotid artery: Secondary | ICD-10-CM | POA: Insufficient documentation

## 2016-04-22 DIAGNOSIS — I10 Essential (primary) hypertension: Secondary | ICD-10-CM | POA: Diagnosis not present

## 2016-04-22 NOTE — Progress Notes (Signed)
*  PRELIMINARY RESULTS* Echocardiogram 2D Echocardiogram has been performed.  Stacey Drain 04/22/2016, 10:11 AM

## 2016-04-28 ENCOUNTER — Telehealth: Payer: Self-pay

## 2016-04-28 NOTE — Telephone Encounter (Signed)
Called pt., left message for pt to return my call.

## 2016-04-28 NOTE — Telephone Encounter (Signed)
-----   Message from Staci T Ashworth, CMA sent at 04/15/2016  2:11 PM EDT -----   ----- Message ----- From: Jonathan F Branch, MD Sent: 04/15/2016  11:32 AM To: Staci T Ashworth, CMA  Please let patient know I spoke with GI and they are ok with trying a statin again. First he needs a CMET, once we have those results if liver still normal we will start a new statin   J Branch MD ----- Message ----- From: Eric A Gill, NP Sent: 04/15/2016  11:11 AM To: Jonathan F Branch, MD  His last two CMPs were normal. I'm ok with trying a challenge with milder statin. I'd recommend a CMP prior to initiating and repeat 1 month after initiating. If LFTs elevate again then we'll at least know he's pretty much intolerant. We could see him at some point as well if you like.  Let me know if there's anything we can do to help! Eric Gill, NP-C  ----- Message ----- From: Jonathan F Branch, MD Sent: 04/15/2016   9:44 AM To: Jonathan F Branch, MD, Eric A Gill, NP  This is a mutual patient you had seen for elevated LFTs. His statin has been held since that time. What are your thoughts about restarting a statin, perhaps a milder one like pravastatin for him?   Jonathan Branch MD   

## 2016-05-02 ENCOUNTER — Telehealth: Payer: Self-pay

## 2016-05-02 NOTE — Telephone Encounter (Signed)
Mailed pt letter to call office.

## 2016-05-02 NOTE — Telephone Encounter (Signed)
Called pt., left voicemail. Will mail pt. A letter to call office.

## 2016-05-02 NOTE — Telephone Encounter (Signed)
-----   Message from Staci T Ashworth, CMA sent at 04/15/2016  2:11 PM EDT -----   ----- Message ----- From: Jonathan F Branch, MD Sent: 04/15/2016  11:32 AM To: Staci T Ashworth, CMA  Please let patient know I spoke with GI and they are ok with trying a statin again. First he needs a CMET, once we have those results if liver still normal we will start a new statin   J Branch MD ----- Message ----- From: Eric A Gill, NP Sent: 04/15/2016  11:11 AM To: Jonathan F Branch, MD  His last two CMPs were normal. I'm ok with trying a challenge with milder statin. I'd recommend a CMP prior to initiating and repeat 1 month after initiating. If LFTs elevate again then we'll at least know he's pretty much intolerant. We could see him at some point as well if you like.  Let me know if there's anything we can do to help! Eric Gill, NP-C  ----- Message ----- From: Jonathan F Branch, MD Sent: 04/15/2016   9:44 AM To: Jonathan F Branch, MD, Eric A Gill, NP  This is a mutual patient you had seen for elevated LFTs. His statin has been held since that time. What are your thoughts about restarting a statin, perhaps a milder one like pravastatin for him?   Jonathan Branch MD   

## 2016-05-02 NOTE — Telephone Encounter (Signed)
-----   Message from Albertine Patricia, CMA sent at 04/15/2016  2:11 PM EDT -----   ----- Message ----- From: Antoine Poche, MD Sent: 04/15/2016  11:32 AM To: Albertine Patricia, CMA  Please let patient know I spoke with GI and they are ok with trying a statin again. First he needs a CMET, once we have those results if liver still normal we will start a new statin   Dominga Ferry MD ----- Message ----- From: Anice Paganini, NP Sent: 04/15/2016  11:11 AM To: Antoine Poche, MD  His last two CMPs were normal. I'm ok with trying a challenge with milder statin. I'd recommend a CMP prior to initiating and repeat 1 month after initiating. If LFTs elevate again then we'll at least know he's pretty much intolerant. We could see him at some point as well if you like.  Let me know if there's anything we can do to help! Wynne Dust, NP-C  ----- Message ----- From: Antoine Poche, MD Sent: 04/15/2016   9:44 AM To: Antoine Poche, MD, Anice Paganini, NP  This is a mutual patient you had seen for elevated LFTs. His statin has been held since that time. What are your thoughts about restarting a statin, perhaps a milder one like pravastatin for him?   Dina Rich MD

## 2016-05-11 ENCOUNTER — Telehealth: Payer: Self-pay | Admitting: *Deleted

## 2016-05-11 DIAGNOSIS — Z79899 Other long term (current) drug therapy: Secondary | ICD-10-CM | POA: Diagnosis not present

## 2016-05-11 LAB — COMPREHENSIVE METABOLIC PANEL
ALK PHOS: 47 U/L (ref 40–115)
ALT: 16 U/L (ref 9–46)
AST: 19 U/L (ref 10–35)
Albumin: 4.1 g/dL (ref 3.6–5.1)
BUN: 15 mg/dL (ref 7–25)
CALCIUM: 9.2 mg/dL (ref 8.6–10.3)
CO2: 30 mmol/L (ref 20–31)
Chloride: 103 mmol/L (ref 98–110)
Creat: 1.2 mg/dL — ABNORMAL HIGH (ref 0.70–1.18)
GLUCOSE: 95 mg/dL (ref 65–99)
POTASSIUM: 4.4 mmol/L (ref 3.5–5.3)
Sodium: 138 mmol/L (ref 135–146)
Total Bilirubin: 0.4 mg/dL (ref 0.2–1.2)
Total Protein: 7.2 g/dL (ref 6.1–8.1)

## 2016-05-11 NOTE — Telephone Encounter (Signed)
-----   Message from Dyann Kief, PA-C sent at 05/11/2016  3:15 PM EST ----- Labs ok

## 2016-05-11 NOTE — Telephone Encounter (Signed)
Called patient with test results. No answer. Left message to call back.  

## 2016-05-25 ENCOUNTER — Encounter: Payer: Self-pay | Admitting: Family

## 2016-05-30 ENCOUNTER — Other Ambulatory Visit: Payer: Self-pay | Admitting: *Deleted

## 2016-05-30 DIAGNOSIS — I6523 Occlusion and stenosis of bilateral carotid arteries: Secondary | ICD-10-CM

## 2016-05-31 ENCOUNTER — Encounter: Payer: Self-pay | Admitting: Family

## 2016-05-31 ENCOUNTER — Ambulatory Visit (HOSPITAL_COMMUNITY)
Admission: RE | Admit: 2016-05-31 | Discharge: 2016-05-31 | Disposition: A | Discharge status: Home / Self Care | Payer: Medicare HMO | Source: Ambulatory Visit | Attending: Vascular Surgery | Admitting: Vascular Surgery

## 2016-05-31 ENCOUNTER — Ambulatory Visit (INDEPENDENT_AMBULATORY_CARE_PROVIDER_SITE_OTHER): Payer: Medicare HMO | Admitting: Family

## 2016-05-31 VITALS — BP 125/76 | HR 64 | Temp 97.7°F | Ht 68.0 in | Wt 198.3 lb

## 2016-05-31 DIAGNOSIS — R0989 Other specified symptoms and signs involving the circulatory and respiratory systems: Secondary | ICD-10-CM | POA: Diagnosis not present

## 2016-05-31 DIAGNOSIS — I6523 Occlusion and stenosis of bilateral carotid arteries: Secondary | ICD-10-CM

## 2016-05-31 DIAGNOSIS — Z9889 Other specified postprocedural states: Secondary | ICD-10-CM

## 2016-05-31 LAB — VAS US CAROTID
LCCADDIAS: -32 cm/s
LCCADSYS: -76 cm/s
LCCAPDIAS: 30 cm/s
LCCAPSYS: 102 cm/s
LEFT ECA DIAS: -32 cm/s
Left ICA dist dias: -23 cm/s
Left ICA dist sys: -68 cm/s
Left ICA prox dias: -79 cm/s
Left ICA prox sys: -217 cm/s
RCCAPSYS: 88 cm/s
RIGHT CCA MID DIAS: -27 cm/s
RIGHT ECA DIAS: -16 cm/s
Right CCA prox dias: 24 cm/s
Right cca dist sys: -96 cm/s

## 2016-05-31 NOTE — Patient Instructions (Signed)
Stroke Prevention Some medical conditions and behaviors are associated with an increased chance of having a stroke. You may prevent a stroke by making healthy choices and managing medical conditions. How can I reduce my risk of having a stroke?  Stay physically active. Get at least 30 minutes of activity on most or all days.  Do not smoke. It may also be helpful to avoid exposure to secondhand smoke.  Limit alcohol use. Moderate alcohol use is considered to be:  No more than 2 drinks per day for men.  No more than 1 drink per day for nonpregnant women.  Eat healthy foods. This involves:  Eating 5 or more servings of fruits and vegetables a day.  Making dietary changes that address high blood pressure (hypertension), high cholesterol, diabetes, or obesity.  Manage your cholesterol levels.  Making food choices that are high in fiber and low in saturated fat, trans fat, and cholesterol may control cholesterol levels.  Take any prescribed medicines to control cholesterol as directed by your health care provider.  Manage your diabetes.  Controlling your carbohydrate and sugar intake is recommended to manage diabetes.  Take any prescribed medicines to control diabetes as directed by your health care provider.  Control your hypertension.  Making food choices that are low in salt (sodium), saturated fat, trans fat, and cholesterol is recommended to manage hypertension.  Ask your health care provider if you need treatment to lower your blood pressure. Take any prescribed medicines to control hypertension as directed by your health care provider.  If you are 18-39 years of age, have your blood pressure checked every 3-5 years. If you are 40 years of age or older, have your blood pressure checked every year.  Maintain a healthy weight.  Reducing calorie intake and making food choices that are low in sodium, saturated fat, trans fat, and cholesterol are recommended to manage  weight.  Stop drug abuse.  Avoid taking birth control pills.  Talk to your health care provider about the risks of taking birth control pills if you are over 35 years old, smoke, get migraines, or have ever had a blood clot.  Get evaluated for sleep disorders (sleep apnea).  Talk to your health care provider about getting a sleep evaluation if you snore a lot or have excessive sleepiness.  Take medicines only as directed by your health care provider.  For some people, aspirin or blood thinners (anticoagulants) are helpful in reducing the risk of forming abnormal blood clots that can lead to stroke. If you have the irregular heart rhythm of atrial fibrillation, you should be on a blood thinner unless there is a good reason you cannot take them.  Understand all your medicine instructions.  Make sure that other conditions (such as anemia or atherosclerosis) are addressed. Get help right away if:  You have sudden weakness or numbness of the face, arm, or leg, especially on one side of the body.  Your face or eyelid droops to one side.  You have sudden confusion.  You have trouble speaking (aphasia) or understanding.  You have sudden trouble seeing in one or both eyes.  You have sudden trouble walking.  You have dizziness.  You have a loss of balance or coordination.  You have a sudden, severe headache with no known cause.  You have new chest pain or an irregular heartbeat. Any of these symptoms may represent a serious problem that is an emergency. Do not wait to see if the symptoms will go away.   Get medical help at once. Call your local emergency services (911 in [place] Do not drive yourself to the hospital. This information is not intended to replace advice given to you by your health care provider. Make sure you discuss any questions you have with your health care provider. Document Released: 07/21/2004 Document Revised: 11/19/2015 Document Reviewed: 12/14/2012 Elsevier  Interactive Patient Education  2017 [employer]  

## 2016-05-31 NOTE — Progress Notes (Signed)
Chief Complaint: Follow up Extracranial Carotid Artery Stenosis   History of Present Illness  Kevin Reeves is a 75 y.o. male patient of Dr. Hart Rochester who is status post right CEA in October 2007 and returns today for follow up.  The patient has no history of TIA or stroke symptoms, specifically the patient denies a history of amaurosis fugax or monocular blindness, denies a history unilateral of facial drooping, denies a history of hemiplegia, and denies a history of receptive or expressive aphasia.   He denies claudication symptoms in legs with walking.  Pt and wife states he has worsening trouble walking, states he pain in his joints, denies radiculopathy type pain, this is under evaluation per his wife. Cholecystectomy, March 28, 2014.  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 Arteriosclerotic cardiovascular disease (ASCVD)    Stent to RI in 2004. Posterior MI in 4/07 with thrombosis of RI at stent site while off Plavix --> reintervention; do not d/c plavix  . Arthritis   . Cerebrovascular disease    Left carotid bruit with 40-59% ICA stenosis; right carotid endarectomy in 2008  . Cholelithiasis 07/21/2009   Asymptomatic  . Colonic polyp 1999   Adenoma resected in 1999; history of diverticulosis  . Coronary artery disease   . Gastroesophageal reflux disease    Dilation of esophagel stricture in 2008  . Hyperlipidemia   . Hypertension   . Myocardial infarction 09/2005    Social History Social History  Substance Use Topics  . Smoking status: Never Smoker  . Smokeless tobacco: Never Used  . Alcohol use No    Family History Family  History  Problem Relation Age of Onset  . Heart disease Father     Before age 1  . Hyperlipidemia Father   . Hypertension Father   . Heart disease Brother   . Heart attack Brother   . Heart disease Brother     Before age 64-  Quad-BPG  . Hyperlipidemia Brother   . Hypertension Brother   . Heart attack Brother   . Stroke Other     Grandparent  . Heart attack Other     Grandparent  . Heart block      Brother  . Heart disease Paternal Grandmother   . Heart disease Paternal Grandfather   . Colon cancer Neg Hx     Surgical History Past Surgical History:  Procedure Laterality Date  . CARDIAC CATHETERIZATION     cardiac stent placed  . CAROTID ENDARTERECTOMY  2008   Right  . CHOLECYSTECTOMY N/A 03/28/2014   Procedure: LAPAROSCOPIC CHOLECYSTECTOMY;  Surgeon: Dalia Heading, MD;  Location: [facility];  Service: General;  Laterality: N/A;  . COLONOSCOPY  2008   adenoma resected in 1999; no intervention in 2008  . COLONOSCOPY WITH ESOPHAGOGASTRODUODENOSCOPY (EGD)  Feb 2010   Dr. Jena Gauss: mild erosive reflux esophagitis, non-critical Schatzki's ring without dilation, moderate sized hiatal hernia, bulbar erosions. Negative H.pylori serology. Colonoscopy with normal rectum, pancolonic diverticula, needs surveillance every 5 years due to history of adenomas.     No Known Allergies  Current Outpatient Prescriptions  Medication Sig Dispense Refill  . aspirin EC 81 MG  tablet Take 81 mg by mouth every evening.     . clopidogrel (PLAVIX) 75 MG tablet Take 75 mg by mouth daily.    Marland Kitchen KRILL OIL PO Take by mouth daily.    . metoprolol succinate (TOPROL-XL) 25 MG 24 hr tablet Take 25 mg by mouth daily.      . Multiple Vitamin (MULTIVITAMIN) tablet Take 1 tablet by mouth daily.      Marland Kitchen omeprazole (PRILOSEC) 20 MG capsule Take 20 mg by mouth daily.    Marland Kitchen NITROSTAT 0.4 MG SL tablet DISSOLVE 1 TABLET UNDER  TONGUE EVERY 5 MINUTES UPTO 15 MIN FOR CHEST PAIN.IF NO RELIEF CALL 911. (Patient not taking:  Reported on 05/31/2016) 25 each 6   No current facility-administered medications for this visit.     Review of Systems : See HPI for pertinent positives and negatives.  Physical Examination  Vitals:   05/31/16 1104 05/31/16 1106  BP: 113/77 125/76  Pulse: 64   Temp: 97.7 F (36.5 C)   TempSrc: Oral   SpO2: 100%   Weight: 198 lb 4.8 oz (89.9 kg)   Height: 5\' 8"  (1.727 m)    Body mass index is 30.15 kg/m.  General: WDWN obese male in NAD GAIT: stiff, slow Eyes: PERRLA Pulmonary: Respirations are non-labored, CTAB, no rales, rhonchi, or wheezing.  Cardiac: regular rhythm,no detected murmur.  VASCULAR EXAM Carotid Bruits Right Left   Negative Negative   Radial pulses are 2+ palpable and equal.      LE Pulses Right Left   POPLITEAL not palpable  not palpable   POSTERIOR TIBIAL not palpable  not palpable    DORSALIS PEDIS  ANTERIOR TIBIAL not palpable  not palpable     Gastrointestinal: soft, nontender, BS WNL, no r/g, no masses palpated.  Musculoskeletal: No muscle atrophy/wasting. M/S 5/5 throughout, extremities without ischemic changes. Feet are warm, pink, with brisk capillary refill.  Neurologic: A&O X 3; Appropriate Affect, sensation is normal, Speech is normal CN 2-12 intact, Pain and light touch intact in extremities, Motor exam as listed above    Assessment: Kevin Reeves is a 75 y.o. male who is status post right CEA in October 2007. He has no history of stroke or TIA but does have a history of CAD.  His pedal pulses are not palpable, he does not describe classic claudication symptoms, there are no signs of ischemia in his feet/legs; no results on file of arterial testing in lower extremities; will obtain ABI;s on his return in 6 months.   DATA  Carotid duplex today  suggests a patent right carotid endarterectomy site with evidence of 40-59% restenosis at CEA site. Left internal carotid artery velocities suggest a 60-79% stenosis.  Right vertebral artery is not visualized. Left vertebral artery flow is antegrade. Bilateral subclavian artery waveforms are normal. Progression of disease in bilateral ICA's compared to the last exam on 05-27-15.   Plan: Follow-up in 6 months with Carotid Duplex scan and ABI's.   I discussed in depth with the patient the nature of atherosclerosis, and emphasized the importance of maximal medical management including strict control of blood pressure, blood glucose, and lipid levels, obtaining regular exercise, and continued cessation of smoking.  The patient is aware that without maximal medical management the underlying atherosclerotic disease process will progress, limiting the benefit of any interventions. The patient was given information about stroke prevention and what symptoms should prompt the patient to seek immediate medical care. Thank you for allowing 05-29-15 to participate  in this patient's care.  Charisse March, RN, MSN, FNP-C [facility] Office: [phone]  Clinic Physician: Early  05/31/16 11:17 AM

## 2016-06-01 ENCOUNTER — Telehealth: Payer: Self-pay | Admitting: Family

## 2016-06-01 NOTE — Telephone Encounter (Signed)
-----   Message from Carma Lair Nickel, NP sent at 05/31/2016  6:40 PM EST ----- Regarding: add ABI's Please add ABI's to his next visit in 6 months along with his carotid duplex. Indication is decreased pedal pulses, no previous results on file for lower extremity arterial studies. Please let pt know this will be added and the reason. Thank you, Rosalita Chessman

## 2016-06-01 NOTE — Telephone Encounter (Signed)
Added on ABI without needed to change the date or arrival time.

## 2016-06-06 DIAGNOSIS — I10 Essential (primary) hypertension: Secondary | ICD-10-CM | POA: Diagnosis not present

## 2016-06-06 DIAGNOSIS — I2510 Atherosclerotic heart disease of native coronary artery without angina pectoris: Secondary | ICD-10-CM | POA: Diagnosis not present

## 2016-06-06 DIAGNOSIS — M170 Bilateral primary osteoarthritis of knee: Secondary | ICD-10-CM | POA: Diagnosis not present

## 2016-06-06 DIAGNOSIS — Z008 Encounter for other general examination: Secondary | ICD-10-CM | POA: Diagnosis not present

## 2016-06-06 DIAGNOSIS — E785 Hyperlipidemia, unspecified: Secondary | ICD-10-CM | POA: Diagnosis not present

## 2016-06-06 DIAGNOSIS — Z23 Encounter for immunization: Secondary | ICD-10-CM | POA: Diagnosis not present

## 2016-06-07 DIAGNOSIS — I10 Essential (primary) hypertension: Secondary | ICD-10-CM | POA: Diagnosis not present

## 2016-06-07 DIAGNOSIS — M170 Bilateral primary osteoarthritis of knee: Secondary | ICD-10-CM | POA: Diagnosis not present

## 2016-06-07 DIAGNOSIS — Z7901 Long term (current) use of anticoagulants: Secondary | ICD-10-CM | POA: Diagnosis not present

## 2016-06-07 DIAGNOSIS — Z125 Encounter for screening for malignant neoplasm of prostate: Secondary | ICD-10-CM | POA: Diagnosis not present

## 2016-06-07 DIAGNOSIS — E079 Disorder of thyroid, unspecified: Secondary | ICD-10-CM | POA: Diagnosis not present

## 2016-06-07 DIAGNOSIS — E785 Hyperlipidemia, unspecified: Secondary | ICD-10-CM | POA: Diagnosis not present

## 2016-06-07 DIAGNOSIS — Z8249 Family history of ischemic heart disease and other diseases of the circulatory system: Secondary | ICD-10-CM | POA: Diagnosis not present

## 2016-06-07 DIAGNOSIS — I2510 Atherosclerotic heart disease of native coronary artery without angina pectoris: Secondary | ICD-10-CM | POA: Diagnosis not present

## 2016-07-06 DIAGNOSIS — D509 Iron deficiency anemia, unspecified: Secondary | ICD-10-CM | POA: Diagnosis not present

## 2016-07-06 DIAGNOSIS — E785 Hyperlipidemia, unspecified: Secondary | ICD-10-CM | POA: Diagnosis not present

## 2016-09-09 DIAGNOSIS — Z7901 Long term (current) use of anticoagulants: Secondary | ICD-10-CM | POA: Diagnosis not present

## 2016-09-09 DIAGNOSIS — I10 Essential (primary) hypertension: Secondary | ICD-10-CM | POA: Diagnosis not present

## 2016-09-09 DIAGNOSIS — E663 Overweight: Secondary | ICD-10-CM | POA: Diagnosis not present

## 2016-09-09 DIAGNOSIS — E785 Hyperlipidemia, unspecified: Secondary | ICD-10-CM | POA: Diagnosis not present

## 2016-09-12 DIAGNOSIS — I10 Essential (primary) hypertension: Secondary | ICD-10-CM | POA: Diagnosis not present

## 2016-09-12 DIAGNOSIS — E079 Disorder of thyroid, unspecified: Secondary | ICD-10-CM | POA: Diagnosis not present

## 2016-09-12 DIAGNOSIS — D509 Iron deficiency anemia, unspecified: Secondary | ICD-10-CM | POA: Diagnosis not present

## 2016-09-12 DIAGNOSIS — Z7901 Long term (current) use of anticoagulants: Secondary | ICD-10-CM | POA: Diagnosis not present

## 2016-09-12 DIAGNOSIS — I2510 Atherosclerotic heart disease of native coronary artery without angina pectoris: Secondary | ICD-10-CM | POA: Diagnosis not present

## 2016-09-12 DIAGNOSIS — Z8249 Family history of ischemic heart disease and other diseases of the circulatory system: Secondary | ICD-10-CM | POA: Diagnosis not present

## 2016-09-12 DIAGNOSIS — Z125 Encounter for screening for malignant neoplasm of prostate: Secondary | ICD-10-CM | POA: Diagnosis not present

## 2016-09-12 DIAGNOSIS — E785 Hyperlipidemia, unspecified: Secondary | ICD-10-CM | POA: Diagnosis not present

## 2016-09-12 DIAGNOSIS — M170 Bilateral primary osteoarthritis of knee: Secondary | ICD-10-CM | POA: Diagnosis not present

## 2016-11-30 ENCOUNTER — Encounter: Payer: Self-pay | Admitting: Family

## 2016-12-05 ENCOUNTER — Other Ambulatory Visit: Payer: Self-pay

## 2016-12-05 NOTE — Addendum Note (Signed)
Addended by: Burton Apley A on: 12/05/2016 12:19 PM   Modules accepted: Orders

## 2016-12-06 ENCOUNTER — Ambulatory Visit (HOSPITAL_COMMUNITY)
Admission: RE | Admit: 2016-12-06 | Discharge: 2016-12-06 | Disposition: A | Discharge status: Home / Self Care | Payer: Medicare HMO | Source: Ambulatory Visit | Attending: Family | Admitting: Family

## 2016-12-06 ENCOUNTER — Ambulatory Visit (INDEPENDENT_AMBULATORY_CARE_PROVIDER_SITE_OTHER)
Admission: RE | Admit: 2016-12-06 | Discharge: 2016-12-06 | Disposition: A | Discharge status: Home / Self Care | Payer: Medicare HMO | Source: Ambulatory Visit | Attending: Family | Admitting: Family

## 2016-12-06 ENCOUNTER — Encounter: Payer: Self-pay | Admitting: Family

## 2016-12-06 ENCOUNTER — Ambulatory Visit: Payer: Medicare HMO | Admitting: Family

## 2016-12-06 ENCOUNTER — Ambulatory Visit (INDEPENDENT_AMBULATORY_CARE_PROVIDER_SITE_OTHER): Payer: Medicare HMO | Admitting: Family

## 2016-12-06 VITALS — BP 114/73 | HR 49 | Temp 97.0°F | Resp 20 | Ht 68.0 in | Wt 192.0 lb

## 2016-12-06 DIAGNOSIS — I6523 Occlusion and stenosis of bilateral carotid arteries: Secondary | ICD-10-CM

## 2016-12-06 DIAGNOSIS — I10 Essential (primary) hypertension: Secondary | ICD-10-CM | POA: Diagnosis not present

## 2016-12-06 DIAGNOSIS — I779 Disorder of arteries and arterioles, unspecified: Secondary | ICD-10-CM | POA: Diagnosis not present

## 2016-12-06 DIAGNOSIS — R0989 Other specified symptoms and signs involving the circulatory and respiratory systems: Secondary | ICD-10-CM | POA: Diagnosis not present

## 2016-12-06 DIAGNOSIS — R938 Abnormal findings on diagnostic imaging of other specified body structures: Secondary | ICD-10-CM | POA: Insufficient documentation

## 2016-12-06 DIAGNOSIS — Z9889 Other specified postprocedural states: Secondary | ICD-10-CM | POA: Diagnosis not present

## 2016-12-06 DIAGNOSIS — E785 Hyperlipidemia, unspecified: Secondary | ICD-10-CM | POA: Insufficient documentation

## 2016-12-06 LAB — VAS US CAROTID
LCCAPDIAS: 29 cm/s
LEFT ECA DIAS: 49 cm/s
LEFT VERTEBRAL DIAS: -22 cm/s
LICADSYS: -130 cm/s
LICAPDIAS: 70 cm/s
Left CCA dist dias: -26 cm/s
Left CCA dist sys: -80 cm/s
Left CCA prox sys: 98 cm/s
Left ICA dist dias: -38 cm/s
Left ICA prox sys: 207 cm/s
RCCADSYS: -108 cm/s
RCCAPDIAS: 20 cm/s
RCCAPSYS: 117 cm/s
RIGHT CCA MID DIAS: 15 cm/s
RIGHT ECA DIAS: -8 cm/s
RIGHT VERTEBRAL DIAS: -13 cm/s

## 2016-12-06 NOTE — Patient Instructions (Signed)
Stroke Prevention Some medical conditions and behaviors are associated with an increased chance of having a stroke. You may prevent a stroke by making healthy choices and managing medical conditions. How can I reduce my risk of having a stroke?  Stay physically active. Get at least 30 minutes of activity on most or all days.  Do not smoke. It may also be helpful to avoid exposure to secondhand smoke.  Limit alcohol use. Moderate alcohol use is considered to be:  No more than 2 drinks per day for men.  No more than 1 drink per day for nonpregnant women.  Eat healthy foods. This involves:  Eating 5 or more servings of fruits and vegetables a day.  Making dietary changes that address high blood pressure (hypertension), high cholesterol, diabetes, or obesity.  Manage your cholesterol levels.  Making food choices that are high in fiber and low in saturated fat, trans fat, and cholesterol may control cholesterol levels.  Take any prescribed medicines to control cholesterol as directed by your health care provider.  Manage your diabetes.  Controlling your carbohydrate and sugar intake is recommended to manage diabetes.  Take any prescribed medicines to control diabetes as directed by your health care provider.  Control your hypertension.  Making food choices that are low in salt (sodium), saturated fat, trans fat, and cholesterol is recommended to manage hypertension.  Ask your health care provider if you need treatment to lower your blood pressure. Take any prescribed medicines to control hypertension as directed by your health care provider.  If you are 18-39 years of age, have your blood pressure checked every 3-5 years. If you are 40 years of age or older, have your blood pressure checked every year.  Maintain a healthy weight.  Reducing calorie intake and making food choices that are low in sodium, saturated fat, trans fat, and cholesterol are recommended to manage  weight.  Stop drug abuse.  Avoid taking birth control pills.  Talk to your health care provider about the risks of taking birth control pills if you are over 35 years old, smoke, get migraines, or have ever had a blood clot.  Get evaluated for sleep disorders (sleep apnea).  Talk to your health care provider about getting a sleep evaluation if you snore a lot or have excessive sleepiness.  Take medicines only as directed by your health care provider.  For some people, aspirin or blood thinners (anticoagulants) are helpful in reducing the risk of forming abnormal blood clots that can lead to stroke. If you have the irregular heart rhythm of atrial fibrillation, you should be on a blood thinner unless there is a good reason you cannot take them.  Understand all your medicine instructions.  Make sure that other conditions (such as anemia or atherosclerosis) are addressed. Get help right away if:  You have sudden weakness or numbness of the face, arm, or leg, especially on one side of the body.  Your face or eyelid droops to one side.  You have sudden confusion.  You have trouble speaking (aphasia) or understanding.  You have sudden trouble seeing in one or both eyes.  You have sudden trouble walking.  You have dizziness.  You have a loss of balance or coordination.  You have a sudden, severe headache with no known cause.  You have new chest pain or an irregular heartbeat. Any of these symptoms may represent a serious problem that is an emergency. Do not wait to see if the symptoms will go away.   Get medical help at once. Call your local emergency services (911 in [place] Do not drive yourself to the hospital. This information is not intended to replace advice given to you by your health care provider. Make sure you discuss any questions you have with your health care provider. Document Released: 07/21/2004 Document Revised: 11/19/2015 Document Reviewed: 12/14/2012 Elsevier  Interactive Patient Education  2017 [employer]  

## 2016-12-06 NOTE — Progress Notes (Signed)
[facility] [place] HISTORY AND PHYSICAL   MRN : [id number]  History of Present Illness:   Kevin Reeves is a 76 y.o. male patient of Dr. Hart Rochester who is status post right CEA in October 2007 and returns today for follow up.  The patient has no history of TIA or stroke symptoms, specifically the patient denies a history of amaurosis fugax or monocular blindness, denies a history unilateral of facial drooping, denies a history of hemiplegia, and denies a history of receptive or expressive aphasia.   He denies claudication symptoms in legs with walking.  Pt and wife states he has worsening trouble walking, states he pain in his joints, denies radiculopathy type pain, this is under evaluation per his wife. Cholecystectomy, March 28, 2014.  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 aspirin EC 81 MG tablet Take 81 mg by mouth every evening.     . clopidogrel (PLAVIX) 75 MG tablet Take 75 mg by mouth daily.    Marland Kitchen KRILL OIL PO Take by mouth daily.    . metoprolol succinate (TOPROL-XL) 25 MG 24 hr tablet Take 25 mg by mouth daily.      . Multiple Vitamin (MULTIVITAMIN) tablet Take 1 tablet by mouth daily.      Marland Kitchen NITROSTAT 0.4 MG SL tablet DISSOLVE 1 TABLET UNDER  TONGUE EVERY 5 MINUTES UPTO 15 MIN FOR CHEST PAIN.IF NO RELIEF CALL 911. 25 each 6  . omeprazole (PRILOSEC) 20 MG capsule Take 20 mg by mouth daily.    . simvastatin (ZOCOR) 20 MG tablet Take 20 mg by mouth daily.     No current facility-administered medications for this visit.     Past Medical History:  Diagnosis Date  . Arteriosclerotic cardiovascular disease (ASCVD)    Stent to RI in 2004. Posterior MI in 4/07 with thrombosis of RI at stent site while off Plavix -->  reintervention; do not d/c plavix  . Arthritis   . Cerebrovascular disease    Left carotid bruit with 40-59% ICA stenosis; right carotid endarectomy in 2008  . Cholelithiasis 07/21/2009   Asymptomatic  . Colonic polyp 1999   Adenoma resected in 1999; history of diverticulosis  . Coronary artery disease   . Gastroesophageal reflux disease    Dilation of esophagel stricture in 2008  . Hyperlipidemia   . Hypertension   . Myocardial infarction [facility]) 09/2005    Social History Social History  Substance Use Topics  . Smoking status: Never Smoker  . Smokeless tobacco: Never Used  . Alcohol use No    Family History Family History  Problem Relation Age of Onset  . Heart disease Father        Before age 17  . Hyperlipidemia Father   . Hypertension Father   . Heart disease Brother   . Heart attack Brother   . Heart disease Brother        Before age 70-  Quad-BPG  . Hyperlipidemia Brother   . Hypertension Brother   . Heart attack Brother   . Stroke Other        Grandparent  . Heart attack Other        Grandparent  . Heart block Unknown        Brother  . Heart disease Paternal Grandmother   .  Heart disease Paternal Grandfather   . Colon cancer Neg Hx     Surgical History Past Surgical History:  Procedure Laterality Date  . CARDIAC CATHETERIZATION     cardiac stent placed  . CAROTID ENDARTERECTOMY  2008   Right  . CHOLECYSTECTOMY N/A 03/28/2014   Procedure: LAPAROSCOPIC CHOLECYSTECTOMY;  Surgeon: Dalia Heading, MD;  Location: [facility];  Service: General;  Laterality: N/A;  . COLONOSCOPY  2008   adenoma resected in 1999; no intervention in 2008  . COLONOSCOPY WITH ESOPHAGOGASTRODUODENOSCOPY (EGD)  Feb 2010   Dr. Jena Gauss: mild erosive reflux esophagitis, non-critical Schatzki's ring without dilation, moderate sized hiatal hernia, bulbar erosions. Negative H.pylori serology. Colonoscopy with normal rectum, pancolonic diverticula, needs surveillance every 5 years due to history of  adenomas.     No Known Allergies  Current Outpatient Prescriptions  Medication Sig Dispense Refill  . aspirin EC 81 MG tablet Take 81 mg by mouth every evening.     . clopidogrel (PLAVIX) 75 MG tablet Take 75 mg by mouth daily.    Marland Kitchen KRILL OIL PO Take by mouth daily.    . metoprolol succinate (TOPROL-XL) 25 MG 24 hr tablet Take 25 mg by mouth daily.      . Multiple Vitamin (MULTIVITAMIN) tablet Take 1 tablet by mouth daily.      Marland Kitchen NITROSTAT 0.4 MG SL tablet DISSOLVE 1 TABLET UNDER  TONGUE EVERY 5 MINUTES UPTO 15 MIN FOR CHEST PAIN.IF NO RELIEF CALL 911. 25 each 6  . omeprazole (PRILOSEC) 20 MG capsule Take 20 mg by mouth daily.    . simvastatin (ZOCOR) 20 MG tablet Take 20 mg by mouth daily.     No current facility-administered medications for this visit.      REVIEW OF SYSTEMS: See HPI for pertinent positives and negatives.  Physical Examination Vitals:   12/06/16 1235 12/06/16 1237  BP: 132/76 114/73  Pulse: (!) 49   Resp: 20   Temp: 97 F (36.1 C)   TempSrc: Oral   SpO2: 99%   Weight: 192 lb (87.1 kg)   Height: 5\' 8"  (1.727 m)    Body mass index is 29.19 kg/m.  General:  WDWN obese male in NAD GAIT:stiff, slow Eyes: PERRLA Pulmonary: Respirations are non-labored, CTAB, no rales, rhonchi, or wheezing.  Cardiac: regular rhythm,no detected murmur.  VASCULAR EXAM Carotid Bruits Right Left   Negative Negative   Radial pulses are 2+ palpable and equal.      LE Pulses Right Left   POPLITEAL not palpable  not palpable   POSTERIOR TIBIAL not palpable  not palpable    DORSALIS PEDIS  ANTERIOR TIBIAL not palpable  not palpable     Gastrointestinal: soft, nontender, BS WNL, no r/g, no masses palpated.  Musculoskeletal: No muscle atrophy/wasting. M/S 5/5 throughout, extremities without  ischemic changes. Feet are warm, pink, with brisk capillary refill.  Neurologic: A&O X 3; Appropriate Affect, sensation is normal, Speech is normal CN 2-12 intact, Pain and light touch intact in extremities, Motor exam as listed above     ASSESSMENT:  Kevin Reeves is a 76 y.o. male who is status post right CEA in October 2007. He has no history of stroke or TIA but does have a history of CAD.  His pedal pulses are not palpable, he does not describe classic claudication symptoms, there are no signs of ischemia in his feet/legs.  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  He takes a daily ASA, Plavix, and a statin.    DATA   Carotid Duplex (12/06/16): Patent right carotid endarterectomy site with evidence of 60-79% restenosis at CEA site. Left internal carotid artery velocities suggest a 60-79% stenosis.  Bilateral vertebral artery flow is antegrade.  Bilateral subclavian artery waveforms are normal.  Progression of disease in the right ICA compared to the last exam on 05-31-16.  ABI (Date:12/06/16):  R:   ABI: 0.87 (no previous),   PT: mono  DP: mono  TBI:  0.46  L:   ABI: 1.01 (no previous),   PT: bi  DP: bi  TBI: 0.69 Mild arterial occlusive disease in the right LE, normal in the left.    Plan:  Graduated walking program discussed and how to achieve.  Follow-up in 6 months with Carotid Duplex scan and ABI's.  I discussed in depth with the patient the nature of atherosclerosis, and emphasized the importance of maximal medical management including strict control of blood pressure, blood glucose, and lipid levels, obtaining regular exercise, and cessation of smoking.  The patient is aware that without maximal medical management the underlying atherosclerotic disease process will progress, limiting the benefit of any interventions.  The patient was given information about stroke prevention and what symptoms  should prompt the patient to seek immediate medical care.  The patient was given information about PAD including signs, symptoms, treatment, what symptoms should prompt the patient to seek immediate medical care, and risk reduction measures to take. Thank you for allowing [place] to participate in this patient's care.  Charisse March, RN, MSN, FNP-C [facility] Office: [phone]  Clinic MD: Early 12/06/2016 12:47 PM

## 2016-12-09 DIAGNOSIS — Z7901 Long term (current) use of anticoagulants: Secondary | ICD-10-CM | POA: Diagnosis not present

## 2016-12-09 DIAGNOSIS — I2510 Atherosclerotic heart disease of native coronary artery without angina pectoris: Secondary | ICD-10-CM | POA: Diagnosis not present

## 2016-12-09 DIAGNOSIS — E785 Hyperlipidemia, unspecified: Secondary | ICD-10-CM | POA: Diagnosis not present

## 2016-12-09 DIAGNOSIS — I10 Essential (primary) hypertension: Secondary | ICD-10-CM | POA: Diagnosis not present

## 2016-12-12 DIAGNOSIS — Z125 Encounter for screening for malignant neoplasm of prostate: Secondary | ICD-10-CM | POA: Diagnosis not present

## 2016-12-12 DIAGNOSIS — E079 Disorder of thyroid, unspecified: Secondary | ICD-10-CM | POA: Diagnosis not present

## 2016-12-12 DIAGNOSIS — I10 Essential (primary) hypertension: Secondary | ICD-10-CM | POA: Diagnosis not present

## 2016-12-12 DIAGNOSIS — D509 Iron deficiency anemia, unspecified: Secondary | ICD-10-CM | POA: Diagnosis not present

## 2016-12-12 DIAGNOSIS — Z8249 Family history of ischemic heart disease and other diseases of the circulatory system: Secondary | ICD-10-CM | POA: Diagnosis not present

## 2016-12-12 DIAGNOSIS — Z7901 Long term (current) use of anticoagulants: Secondary | ICD-10-CM | POA: Diagnosis not present

## 2016-12-12 DIAGNOSIS — E785 Hyperlipidemia, unspecified: Secondary | ICD-10-CM | POA: Diagnosis not present

## 2016-12-12 DIAGNOSIS — I2510 Atherosclerotic heart disease of native coronary artery without angina pectoris: Secondary | ICD-10-CM | POA: Diagnosis not present

## 2016-12-12 DIAGNOSIS — M170 Bilateral primary osteoarthritis of knee: Secondary | ICD-10-CM | POA: Diagnosis not present

## 2016-12-16 NOTE — Addendum Note (Signed)
Addended by: Burton Apley A on: 12/16/2016 02:10 PM   Modules accepted: Orders

## 2017-02-17 DIAGNOSIS — Z0100 Encounter for examination of eyes and vision without abnormal findings: Secondary | ICD-10-CM | POA: Diagnosis not present

## 2017-02-17 DIAGNOSIS — I10 Essential (primary) hypertension: Secondary | ICD-10-CM | POA: Diagnosis not present

## 2017-02-17 DIAGNOSIS — H5200 Hypermetropia, unspecified eye: Secondary | ICD-10-CM | POA: Diagnosis not present

## 2017-04-10 DIAGNOSIS — R69 Illness, unspecified: Secondary | ICD-10-CM | POA: Diagnosis not present

## 2017-04-13 ENCOUNTER — Encounter: Payer: Self-pay | Admitting: Cardiology

## 2017-04-13 ENCOUNTER — Ambulatory Visit (INDEPENDENT_AMBULATORY_CARE_PROVIDER_SITE_OTHER): Payer: Medicare HMO | Admitting: Cardiology

## 2017-04-13 VITALS — BP 116/64 | HR 74 | Ht 69.0 in | Wt 184.0 lb

## 2017-04-13 DIAGNOSIS — E782 Mixed hyperlipidemia: Secondary | ICD-10-CM | POA: Diagnosis not present

## 2017-04-13 DIAGNOSIS — I10 Essential (primary) hypertension: Secondary | ICD-10-CM

## 2017-04-13 DIAGNOSIS — I2510 Atherosclerotic heart disease of native coronary artery without angina pectoris: Secondary | ICD-10-CM | POA: Diagnosis not present

## 2017-04-13 DIAGNOSIS — R079 Chest pain, unspecified: Secondary | ICD-10-CM

## 2017-04-13 NOTE — Progress Notes (Signed)
Clinical Summary Kevin Reeves is a 76 y.o.male seen today for follow up of the following medical problems.   1. 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 LV gram at that time 40-45% with akinesis of lateral wall.  - Statin stopped by GI doctor due to abnormal LFTs, stopped rampiril due to low bp's.   - increased SOB and palpitations, tightness in chest. Started last year, has been progressing. Does not occur at rest.  - example occurs with walking garbage can to home.  - no recent edema - compliant with meds.    2. HTN - compliant with meds  3. Carotid stenosis - prior right CEA - followed by vascular surgery  4. Hyperlipidemia - off statin per GI  due to elevated LFTs    SH: works Manufacturing engineer   Past Medical History:  Diagnosis Date  . Arteriosclerotic cardiovascular disease (ASCVD)    Stent to RI in 2004. Posterior MI in 4/07 with thrombosis of RI at stent site while off Plavix --> reintervention; do not d/c plavix  . Arthritis   . Cerebrovascular disease    Left carotid bruit with 40-59% ICA stenosis; right carotid endarectomy in 2008  . Cholelithiasis 07/21/2009   Asymptomatic  . Colonic polyp 1999   Adenoma resected in 1999; history of diverticulosis  . Coronary artery disease   . Gastroesophageal reflux disease    Dilation of esophagel stricture in 2008  . Hyperlipidemia   . Hypertension   . Myocardial infarction (HCC) 09/2005     No Known Allergies   Current Outpatient Prescriptions  Medication Sig Dispense Refill  . aspirin EC 81 MG tablet Take 81 mg by mouth every evening.     . clopidogrel (PLAVIX) 75 MG tablet Take 75 mg by mouth daily.    Marland Kitchen KRILL OIL PO Take by mouth daily.    . metoprolol succinate (TOPROL-XL) 25 MG 24 hr tablet Take 25 mg by mouth daily.      . Multiple Vitamin (MULTIVITAMIN) tablet Take 1 tablet by  mouth daily.      Marland Kitchen NITROSTAT 0.4 MG SL tablet DISSOLVE 1 TABLET UNDER  TONGUE EVERY 5 MINUTES UPTO 15 MIN FOR CHEST PAIN.IF NO RELIEF CALL 911. 25 each 6  . omeprazole (PRILOSEC) 20 MG capsule Take 20 mg by mouth daily.    . simvastatin (ZOCOR) 20 MG tablet Take 20 mg by mouth daily.     No current facility-administered medications for this visit.      Past Surgical History:  Procedure Laterality Date  . CARDIAC CATHETERIZATION     cardiac stent placed  . CAROTID ENDARTERECTOMY  2008   Right  . CHOLECYSTECTOMY N/A 03/28/2014   Procedure: LAPAROSCOPIC CHOLECYSTECTOMY;  Surgeon: Dalia Heading, MD;  Location: [facility];  Service: General;  Laterality: N/A;  . COLONOSCOPY  2008   adenoma resected in 1999; no intervention in 2008  . COLONOSCOPY WITH ESOPHAGOGASTRODUODENOSCOPY (EGD)  Feb 2010   Dr. Jena Gauss: mild erosive reflux esophagitis, non-critical Schatzki's ring without dilation, moderate sized hiatal hernia, bulbar erosions. Negative H.pylori serology. Colonoscopy with normal rectum, pancolonic diverticula, needs surveillance every 5 years due to history of adenomas.      No Known Allergies    Family History  Problem Relation Age of Onset  . Heart disease Father        Before age 47  .  Hyperlipidemia Father   . Hypertension Father   . Heart disease Brother   . Heart attack Brother   . Heart disease Brother        Before age 86-  Quad-BPG  . Hyperlipidemia Brother   . Hypertension Brother   . Heart attack Brother   . Stroke Other        Grandparent  . Heart attack Other        Grandparent  . Heart block Unknown        Brother  . Heart disease Paternal Grandmother   . Heart disease Paternal Grandfather   . Colon cancer Neg Hx      Social History Kevin Reeves reports that he has never smoked. He has never used smokeless tobacco. Kevin Reeves reports that he does not drink alcohol.   Review of Systems CONSTITUTIONAL: No weight loss, fever, chills, weakness or fatigue.    HEENT: Eyes: No visual loss, blurred vision, double vision or yellow sclerae.No hearing loss, sneezing, congestion, runny nose or sore throat.  SKIN: No rash or itching.  CARDIOVASCULAR: per hpi RESPIRATORY: per hpi GASTROINTESTINAL: No anorexia, nausea, vomiting or diarrhea. No abdominal pain or blood.  GENITOURINARY: No burning on urination, no polyuria NEUROLOGICAL: No headache, dizziness, syncope, paralysis, ataxia, numbness or tingling in the extremities. No change in bowel or bladder control.  MUSCULOSKELETAL: No muscle, back pain, joint pain or stiffness.  LYMPHATICS: No enlarged nodes. No history of splenectomy.  PSYCHIATRIC: No history of depression or anxiety.  ENDOCRINOLOGIC: No reports of sweating, cold or heat intolerance. No polyuria or polydipsia.  Marland Kitchen   Physical Examination Vitals:   04/13/17 0829  BP: 116/64  Pulse: 74  SpO2: 95%   Vitals:   04/13/17 0829  Weight: 184 lb (83.5 kg)  Height: 5\' 9"  (1.753 m)    Gen: resting comfortably, no acute distress HEENT: no scleral icterus, pupils equal round and reactive, no palptable cervical adenopathy,  CV: RRR, no m/r/g, no jvd Resp: Clear to auscultation bilaterally GI: abdomen is soft, non-tender, non-distended, normal bowel sounds, no hepatosplenomegaly MSK: extremities are warm, no edema.  Skin: warm, no rash Neuro:  no focal deficits Psych: appropriate affect   Diagnostic Studies 09/2005 Cath RESULTS: The aortic pressure was 122/62 with mean of 89 and left  ventricular pressure was 122/20.  LEFT MAIN CORONARY ARTERY: The left main coronary artery was free of  disease.  LEFT ANTERIOR DESCENDING ARTERY: The left anterior descending artery gave  rise to a large diagonal Mikhael Hendriks and two septal perforators. There was 70%  narrowing of the proximal LAD and 90% stenosis of the ostium of the  diagonal Moana Munford.  CIRCUMFLEX ARTERY: The circumflex artery gave rise to a ramus Daltyn Degroat and  two marginal branches  and the posterolateral branches. The ramus Gable Odonohue was  completed occluded near its ostium within the stent. There was __________  ostial stenosis of the first marginal Brystol Wasilewski.  RIGHT CORONARY ARTERY: The right coronary artery is a moderate size vessel  that gave rise to a right ventricular Teegan Brandis, posterior descending Devario Bucklew  and two posterolateral branches. There was 30% narrowing in the mid right  coronary artery.  LEFT VENTRICULOGRAM: The left ventriculogram performed in the RAO  projection showed akinesis of the lateral wall. The estimated ejection  fraction was 40-45%. The left ventriculogram performed in the LAO  projection showed akinesis of the posterior and inferolateral wall. The tip  of the apex and septum moved well and the superior portion of  the posterior  wall moved well.  Following aspiration thrombectomy and PTCA of the in-stent thrombotic lesion  in the ramus Wheeler Incorvaia of the circumflex artery the stenosis improved from 100%  to 0% and the flow improved from TIMI 0 to TIMI 3 flow. Myocardial blush  improved from grade 0 to grade 2.  The patient had the onset of chest pain at 12:35 and arrived at [facility]  emergency room at 12:42. The first ECG was obtained at 12:50 and this was  not diagnostic. The second ECG at 1325 was diagnostic for a posterior  infarction. The patient arrived at the cath lab at [facility] at 1510 and  the reperfusion was established with a diver catheter at 1552. This gave a  __________ time from the time of the diagnostic ECG of 2 hours and 27  minutes and a refusion time of 2 hours and 27 minutes.  CONCLUSION:  1. Acute posterior wall myocardial infarction due to stent thrombosis  within the stent in the ramus Kayl Stogdill of circumflex artery with a total  occlusion of the ramus Ryana Montecalvo of the circumflex artery, 70% narrowing at  the ostium of the first marginal Azeem Poorman of the circumflex artery, 70%  narrowing of the proximal  LAD with 90% narrowing in the large diagonal  Pamala Hayman, 30% narrowing in the mid right coronary and posterior and  inferolateral wall akinesis with an estimated ejection fraction of 40-  45%.  2. Successful aspiration thrombectomy and PTCA for in-stent thrombosis of  the ramus Abigaelle Verley of the circumflex artery with improvement in central  narrowing from 100% to 0%, improvement of flow from TIMI 0 to TIMI 3  flow.    Assessment and Plan   1. CAD/ICM - recent exertional SOB and chest pain concerning for cardiac chest pain - we will initially obtain echo, and likely we will plan for cath after for next week  2. HTN - bp is at goal, continue current meds  3. Carotid stenosis - continue regular follow up with vascular  4. Hyperlipidemia - continue low dose statin, prior LFT elevation on higher doses   I have reviewed the risks, indications, and alternatives to cardiac catheterization, possible angioplasty, and stenting with the patient today. Risks include but are not limited to bleeding, infection, vascular injury, stroke, myocardial infection, arrhythmia, kidney injury, radiation-related injury in the case of prolonged fluoroscopy use, emergency cardiac surgery, and death. The patient understands the risks of serious complication is 1-2 in 1000 with diagnostic cardiac cath and 1-2% or less with angioplasty/stenting.    Antoine Poche, M.D.

## 2017-04-13 NOTE — Patient Instructions (Signed)
Medication Instructions:  Your physician recommends that you continue on your current medications as directed. Please refer to the Current Medication list given to you today.   Labwork: NONE  Testing/Procedures: Your physician has requested that you have an echocardiogram. Echocardiography is a painless test that uses sound waves to create images of your heart. It provides your doctor with information about the size and shape of your heart and how well your heart's chambers and valves are working. This procedure takes approximately one hour. There are no restrictions for this procedure.    Follow-Up: Your physician recommends that you schedule a follow-up appointment in: 1 MONTH    Any Other Special Instructions Will Be Listed Below (If Applicable).     If you need a refill on your cardiac medications before your next appointment, please call your pharmacy.   

## 2017-04-14 ENCOUNTER — Ambulatory Visit (HOSPITAL_COMMUNITY)
Admission: RE | Admit: 2017-04-14 | Discharge: 2017-04-14 | Disposition: A | Discharge status: Home / Self Care | Payer: Medicare HMO | Source: Ambulatory Visit | Attending: Cardiology | Admitting: Cardiology

## 2017-04-14 DIAGNOSIS — E785 Hyperlipidemia, unspecified: Secondary | ICD-10-CM | POA: Insufficient documentation

## 2017-04-14 DIAGNOSIS — R079 Chest pain, unspecified: Secondary | ICD-10-CM | POA: Diagnosis not present

## 2017-04-14 DIAGNOSIS — K219 Gastro-esophageal reflux disease without esophagitis: Secondary | ICD-10-CM | POA: Diagnosis not present

## 2017-04-14 DIAGNOSIS — I10 Essential (primary) hypertension: Secondary | ICD-10-CM | POA: Diagnosis not present

## 2017-04-14 DIAGNOSIS — I340 Nonrheumatic mitral (valve) insufficiency: Secondary | ICD-10-CM | POA: Insufficient documentation

## 2017-04-14 NOTE — Progress Notes (Signed)
*  PRELIMINARY RESULTS* Echocardiogram 2D Echocardiogram has been performed.  Jeryl Columbia 04/14/2017, 2:54 PM

## 2017-04-17 ENCOUNTER — Telehealth: Payer: Self-pay

## 2017-04-17 ENCOUNTER — Telehealth: Payer: Self-pay | Admitting: *Deleted

## 2017-04-17 ENCOUNTER — Encounter: Payer: Self-pay | Admitting: *Deleted

## 2017-04-17 DIAGNOSIS — I10 Essential (primary) hypertension: Secondary | ICD-10-CM

## 2017-04-17 DIAGNOSIS — I2510 Atherosclerotic heart disease of native coronary artery without angina pectoris: Secondary | ICD-10-CM

## 2017-04-17 NOTE — Telephone Encounter (Signed)
Pt and wife notified of cath date and time.

## 2017-04-17 NOTE — Telephone Encounter (Signed)
Left message per DPR  Patient contacted pre-catheterization at [facility] scheduled for:  04/19/2017 @ 1300 Verified arrival time and place:  [facility] @ 1100 Confirmed AM meds to be taken pre-cath with sip of water: Take ASA/plavix  Notified must have driver home

## 2017-04-18 ENCOUNTER — Other Ambulatory Visit: Payer: Self-pay | Admitting: Cardiology

## 2017-04-18 ENCOUNTER — Other Ambulatory Visit (HOSPITAL_COMMUNITY)
Admission: RE | Admit: 2017-04-18 | Discharge: 2017-04-18 | Disposition: A | Discharge status: Home / Self Care | Payer: Medicare HMO | Source: Ambulatory Visit | Attending: Cardiology | Admitting: Cardiology

## 2017-04-18 DIAGNOSIS — R0789 Other chest pain: Secondary | ICD-10-CM

## 2017-04-18 DIAGNOSIS — I2510 Atherosclerotic heart disease of native coronary artery without angina pectoris: Secondary | ICD-10-CM | POA: Diagnosis not present

## 2017-04-18 DIAGNOSIS — I10 Essential (primary) hypertension: Secondary | ICD-10-CM | POA: Diagnosis not present

## 2017-04-18 LAB — BASIC METABOLIC PANEL
Anion gap: 8 (ref 5–15)
BUN: 15 mg/dL (ref 6–20)
CALCIUM: 9.4 mg/dL (ref 8.9–10.3)
CO2: 28 mmol/L (ref 22–32)
CREATININE: 1.12 mg/dL (ref 0.61–1.24)
Chloride: 102 mmol/L (ref 101–111)
GFR calc Af Amer: >60 mL/min (ref >60)
Glucose, Bld: 105 mg/dL — ABNORMAL HIGH (ref 65–99)
POTASSIUM: 4.4 mmol/L (ref 3.5–5.1)
SODIUM: 138 mmol/L (ref 135–145)

## 2017-04-18 LAB — CBC WITH DIFFERENTIAL/PLATELET
BASOS ABS: 0 10*3/uL (ref 0.0–0.1)
BASOS PCT: 0 %
EOS PCT: 4 %
Eosinophils Absolute: 0.4 10*3/uL (ref 0.0–0.7)
HCT: 39.8 % (ref 39.0–52.0)
Hemoglobin: 12.6 g/dL — ABNORMAL LOW (ref 13.0–17.0)
LYMPHS PCT: 33 %
Lymphs Abs: 3.2 10*3/uL (ref 0.7–4.0)
MCH: 27.8 pg (ref 26.0–34.0)
MCHC: 31.7 g/dL (ref 30.0–36.0)
MCV: 87.9 fL (ref 78.0–100.0)
MONO ABS: 0.9 10*3/uL (ref 0.1–1.0)
Monocytes Relative: 9 %
Neutro Abs: 5.4 10*3/uL (ref 1.7–7.7)
Neutrophils Relative %: 54 %
PLATELETS: 337 10*3/uL (ref 150–400)
RBC: 4.53 MIL/uL (ref 4.22–5.81)
RDW: 15 % (ref 11.5–15.5)
WBC: 10 10*3/uL (ref 4.0–10.5)

## 2017-04-19 ENCOUNTER — Inpatient Hospital Stay (HOSPITAL_COMMUNITY)
Admission: AD | Admit: 2017-04-19 | Discharge: 2017-04-29 | DRG: 234 | Disposition: A | Discharge status: Home / Self Care | Payer: Medicare HMO | Source: Ambulatory Visit | Attending: Surgery | Admitting: Surgery

## 2017-04-19 ENCOUNTER — Encounter (HOSPITAL_COMMUNITY)
Admission: AD | Disposition: A | Discharge status: Home / Self Care | Payer: Self-pay | Source: Ambulatory Visit | Attending: Cardiovascular Disease

## 2017-04-19 ENCOUNTER — Encounter (HOSPITAL_COMMUNITY): Payer: Self-pay | Admitting: *Deleted

## 2017-04-19 DIAGNOSIS — I110 Hypertensive heart disease with heart failure: Secondary | ICD-10-CM | POA: Diagnosis present

## 2017-04-19 DIAGNOSIS — I2510 Atherosclerotic heart disease of native coronary artery without angina pectoris: Secondary | ICD-10-CM | POA: Diagnosis present

## 2017-04-19 DIAGNOSIS — E8770 Fluid overload, unspecified: Secondary | ICD-10-CM | POA: Diagnosis not present

## 2017-04-19 DIAGNOSIS — I6529 Occlusion and stenosis of unspecified carotid artery: Secondary | ICD-10-CM | POA: Diagnosis present

## 2017-04-19 DIAGNOSIS — Z8673 Personal history of transient ischemic attack (TIA), and cerebral infarction without residual deficits: Secondary | ICD-10-CM

## 2017-04-19 DIAGNOSIS — I255 Ischemic cardiomyopathy: Secondary | ICD-10-CM | POA: Diagnosis present

## 2017-04-19 DIAGNOSIS — D62 Acute posthemorrhagic anemia: Secondary | ICD-10-CM | POA: Diagnosis present

## 2017-04-19 DIAGNOSIS — Z7982 Long term (current) use of aspirin: Secondary | ICD-10-CM | POA: Diagnosis not present

## 2017-04-19 DIAGNOSIS — R0789 Other chest pain: Secondary | ICD-10-CM

## 2017-04-19 DIAGNOSIS — I200 Unstable angina: Secondary | ICD-10-CM | POA: Diagnosis present

## 2017-04-19 DIAGNOSIS — R079 Chest pain, unspecified: Secondary | ICD-10-CM | POA: Diagnosis present

## 2017-04-19 DIAGNOSIS — Z7902 Long term (current) use of antithrombotics/antiplatelets: Secondary | ICD-10-CM

## 2017-04-19 DIAGNOSIS — Z951 Presence of aortocoronary bypass graft: Secondary | ICD-10-CM

## 2017-04-19 DIAGNOSIS — I083 Combined rheumatic disorders of mitral, aortic and tricuspid valves: Secondary | ICD-10-CM | POA: Diagnosis not present

## 2017-04-19 DIAGNOSIS — E119 Type 2 diabetes mellitus without complications: Secondary | ICD-10-CM | POA: Diagnosis present

## 2017-04-19 DIAGNOSIS — Z79899 Other long term (current) drug therapy: Secondary | ICD-10-CM | POA: Diagnosis not present

## 2017-04-19 DIAGNOSIS — I6521 Occlusion and stenosis of right carotid artery: Secondary | ICD-10-CM | POA: Diagnosis not present

## 2017-04-19 DIAGNOSIS — I5022 Chronic systolic (congestive) heart failure: Secondary | ICD-10-CM | POA: Diagnosis not present

## 2017-04-19 DIAGNOSIS — I25700 Atherosclerosis of coronary artery bypass graft(s), unspecified, with unstable angina pectoris: Secondary | ICD-10-CM | POA: Diagnosis not present

## 2017-04-19 DIAGNOSIS — J90 Pleural effusion, not elsewhere classified: Secondary | ICD-10-CM | POA: Diagnosis not present

## 2017-04-19 DIAGNOSIS — K210 Gastro-esophageal reflux disease with esophagitis: Secondary | ICD-10-CM | POA: Diagnosis present

## 2017-04-19 DIAGNOSIS — I252 Old myocardial infarction: Secondary | ICD-10-CM | POA: Diagnosis not present

## 2017-04-19 DIAGNOSIS — R945 Abnormal results of liver function studies: Secondary | ICD-10-CM | POA: Diagnosis not present

## 2017-04-19 DIAGNOSIS — J984 Other disorders of lung: Secondary | ICD-10-CM | POA: Diagnosis not present

## 2017-04-19 DIAGNOSIS — I25110 Atherosclerotic heart disease of native coronary artery with unstable angina pectoris: Principal | ICD-10-CM | POA: Diagnosis present

## 2017-04-19 DIAGNOSIS — K8020 Calculus of gallbladder without cholecystitis without obstruction: Secondary | ICD-10-CM | POA: Diagnosis present

## 2017-04-19 DIAGNOSIS — Z955 Presence of coronary angioplasty implant and graft: Secondary | ICD-10-CM | POA: Diagnosis not present

## 2017-04-19 DIAGNOSIS — R918 Other nonspecific abnormal finding of lung field: Secondary | ICD-10-CM | POA: Diagnosis not present

## 2017-04-19 DIAGNOSIS — E785 Hyperlipidemia, unspecified: Secondary | ICD-10-CM | POA: Diagnosis not present

## 2017-04-19 DIAGNOSIS — I493 Ventricular premature depolarization: Secondary | ICD-10-CM | POA: Diagnosis not present

## 2017-04-19 DIAGNOSIS — I10 Essential (primary) hypertension: Secondary | ICD-10-CM | POA: Diagnosis not present

## 2017-04-19 DIAGNOSIS — I340 Nonrheumatic mitral (valve) insufficiency: Secondary | ICD-10-CM | POA: Diagnosis not present

## 2017-04-19 DIAGNOSIS — Z01810 Encounter for preprocedural cardiovascular examination: Secondary | ICD-10-CM | POA: Diagnosis not present

## 2017-04-19 HISTORY — PX: LEFT HEART CATH AND CORONARY ANGIOGRAPHY: CATH118249

## 2017-04-19 LAB — PROTIME-INR
INR: 1.07
PROTHROMBIN TIME: 13.8 s (ref 11.4–15.2)

## 2017-04-19 SURGERY — LEFT HEART CATH AND CORONARY ANGIOGRAPHY
Anesthesia: LOCAL

## 2017-04-19 MED ORDER — MIDAZOLAM HCL 2 MG/2ML IJ SOLN
INTRAMUSCULAR | Status: DC | PRN
  Administered 2017-04-19: 2 mg via INTRAVENOUS

## 2017-04-19 MED ORDER — SODIUM CHLORIDE 0.9% FLUSH
3.0000 mL | Freq: Two times a day (BID) | INTRAVENOUS | Status: DC
  Administered 2017-04-20 – 2017-04-24 (×9): 3 mL via INTRAVENOUS

## 2017-04-19 MED ORDER — VERAPAMIL HCL 2.5 MG/ML IV SOLN
INTRAVENOUS | Status: AC
  Filled 2017-04-19: qty 2

## 2017-04-19 MED ORDER — SODIUM CHLORIDE 0.9 % IV SOLN: INTRAVENOUS | Status: AC

## 2017-04-19 MED ORDER — SODIUM CHLORIDE 0.9 % WEIGHT BASED INFUSION: 1.0000 mL/kg/h | INTRAVENOUS | Status: DC

## 2017-04-19 MED ORDER — IOPAMIDOL (ISOVUE-370) INJECTION 76%
INTRAVENOUS | Status: DC | PRN
  Administered 2017-04-19: 90 mL via INTRA_ARTERIAL

## 2017-04-19 MED ORDER — SODIUM CHLORIDE 0.9% FLUSH: 3.0000 mL | INTRAVENOUS | Status: DC | PRN

## 2017-04-19 MED ORDER — FENTANYL CITRATE (PF) 100 MCG/2ML IJ SOLN
INTRAMUSCULAR | Status: DC | PRN
  Administered 2017-04-19: 25 ug via INTRAVENOUS

## 2017-04-19 MED ORDER — HEPARIN (PORCINE) IN NACL 2-0.9 UNIT/ML-% IJ SOLN
INTRAMUSCULAR | Status: AC | PRN
Start: 2017-04-19 — End: 2017-04-19
  Administered 2017-04-19: 1000 mL

## 2017-04-19 MED ORDER — SODIUM CHLORIDE 0.9% FLUSH: 3.0000 mL | Freq: Two times a day (BID) | INTRAVENOUS | Status: DC

## 2017-04-19 MED ORDER — SODIUM CHLORIDE 0.9 % IV SOLN: 250.0000 mL | INTRAVENOUS | Status: DC | PRN

## 2017-04-19 MED ORDER — SODIUM CHLORIDE 0.9 % WEIGHT BASED INFUSION
3.0000 mL/kg/h | INTRAVENOUS | Status: DC
  Administered 2017-04-19: 3 mL/kg/h via INTRAVENOUS

## 2017-04-19 MED ORDER — METOPROLOL SUCCINATE ER 25 MG PO TB24
25.0000 mg | ORAL_TABLET | Freq: Every day | ORAL | Status: DC
  Administered 2017-04-20 – 2017-04-24 (×5): 25 mg via ORAL
  Filled 2017-04-19 (×5): qty 1

## 2017-04-19 MED ORDER — ACETAMINOPHEN 325 MG PO TABS: 650.0000 mg | ORAL_TABLET | ORAL | Status: DC | PRN

## 2017-04-19 MED ORDER — HEPARIN SODIUM (PORCINE) 1000 UNIT/ML IJ SOLN
INTRAMUSCULAR | Status: AC
  Filled 2017-04-19: qty 1

## 2017-04-19 MED ORDER — FENTANYL CITRATE (PF) 100 MCG/2ML IJ SOLN
INTRAMUSCULAR | Status: AC
  Filled 2017-04-19: qty 2

## 2017-04-19 MED ORDER — NITROGLYCERIN 0.4 MG SL SUBL: 0.4000 mg | SUBLINGUAL_TABLET | SUBLINGUAL | Status: DC | PRN

## 2017-04-19 MED ORDER — ADULT MULTIVITAMIN W/MINERALS CH
1.0000 | ORAL_TABLET | Freq: Every day | ORAL | Status: DC
  Administered 2017-04-20 – 2017-04-29 (×10): 1 via ORAL
  Filled 2017-04-19 (×10): qty 1

## 2017-04-19 MED ORDER — PANTOPRAZOLE SODIUM 40 MG PO TBEC
40.0000 mg | DELAYED_RELEASE_TABLET | Freq: Every day | ORAL | Status: DC
  Administered 2017-04-20 – 2017-04-24 (×5): 40 mg via ORAL
  Filled 2017-04-19 (×5): qty 1

## 2017-04-19 MED ORDER — HEPARIN SODIUM (PORCINE) 1000 UNIT/ML IJ SOLN
INTRAMUSCULAR | Status: DC | PRN
  Administered 2017-04-19: 4000 [IU] via INTRAVENOUS

## 2017-04-19 MED ORDER — ASPIRIN EC 81 MG PO TBEC
81.0000 mg | DELAYED_RELEASE_TABLET | Freq: Every evening | ORAL | Status: DC
  Administered 2017-04-20 – 2017-04-23 (×4): 81 mg via ORAL
  Filled 2017-04-19 (×4): qty 1

## 2017-04-19 MED ORDER — MIDAZOLAM HCL 2 MG/2ML IJ SOLN
INTRAMUSCULAR | Status: AC
  Filled 2017-04-19: qty 2

## 2017-04-19 MED ORDER — ASPIRIN 81 MG PO CHEW: 81.0000 mg | CHEWABLE_TABLET | ORAL | Status: DC

## 2017-04-19 MED ORDER — LIDOCAINE HCL 2 % IJ SOLN
INTRAMUSCULAR | Status: DC | PRN
  Administered 2017-04-19: 2 mL via INTRADERMAL

## 2017-04-19 MED ORDER — HEPARIN (PORCINE) IN NACL 2-0.9 UNIT/ML-% IJ SOLN
INTRAMUSCULAR | Status: AC
  Filled 2017-04-19: qty 1000

## 2017-04-19 MED ORDER — LIDOCAINE HCL 2 % IJ SOLN
INTRAMUSCULAR | Status: AC
  Filled 2017-04-19: qty 20

## 2017-04-19 MED ORDER — ONDANSETRON HCL 4 MG/2ML IJ SOLN: 4.0000 mg | Freq: Four times a day (QID) | INTRAMUSCULAR | Status: DC | PRN

## 2017-04-19 MED ORDER — IOPAMIDOL (ISOVUE-370) INJECTION 76%
INTRAVENOUS | Status: AC
  Filled 2017-04-19: qty 100

## 2017-04-19 MED ORDER — CLOPIDOGREL BISULFATE 75 MG PO TABS: 75.0000 mg | ORAL_TABLET | ORAL | Status: DC

## 2017-04-19 MED ORDER — VERAPAMIL HCL 2.5 MG/ML IV SOLN
INTRAVENOUS | Status: DC | PRN
  Administered 2017-04-19: 10 mL via INTRA_ARTERIAL

## 2017-04-19 SURGICAL SUPPLY — 12 items

## 2017-04-19 NOTE — H&P (View-Only) (Signed)
Clinical Summary Kevin Reeves is a 76 y.o.male seen today for follow up of the following medical problems.   1. 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 LV gram at that time 40-45% with akinesis of lateral wall.  - Statin stopped by GI doctor due to abnormal LFTs, stopped rampiril due to low bp's.   - increased SOB and palpitations, tightness in chest. Started last year, has been progressing. Does not occur at rest.  - example occurs with walking garbage can to home.  - no recent edema - compliant with meds.    2. HTN - compliant with meds  3. Carotid stenosis - prior right CEA - followed by vascular surgery  4. Hyperlipidemia - off statin per GI  due to elevated LFTs    SH: works Manufacturing engineer   Past Medical History:  Diagnosis Date  . Arteriosclerotic cardiovascular disease (ASCVD)    Stent to RI in 2004. Posterior MI in 4/07 with thrombosis of RI at stent site while off Plavix --> reintervention; do not d/c plavix  . Arthritis   . Cerebrovascular disease    Left carotid bruit with 40-59% ICA stenosis; right carotid endarectomy in 2008  . Cholelithiasis 07/21/2009   Asymptomatic  . Colonic polyp 1999   Adenoma resected in 1999; history of diverticulosis  . Coronary artery disease   . Gastroesophageal reflux disease    Dilation of esophagel stricture in 2008  . Hyperlipidemia   . Hypertension   . Myocardial infarction (HCC) 09/2005     No Known Allergies   Current Outpatient Prescriptions  Medication Sig Dispense Refill  . aspirin EC 81 MG tablet Take 81 mg by mouth every evening.     . clopidogrel (PLAVIX) 75 MG tablet Take 75 mg by mouth daily.    Marland Kitchen KRILL OIL PO Take by mouth daily.    . metoprolol succinate (TOPROL-XL) 25 MG 24 hr tablet Take 25 mg by mouth daily.      . Multiple Vitamin (MULTIVITAMIN) tablet Take 1 tablet by  mouth daily.      Marland Kitchen NITROSTAT 0.4 MG SL tablet DISSOLVE 1 TABLET UNDER  TONGUE EVERY 5 MINUTES UPTO 15 MIN FOR CHEST PAIN.IF NO RELIEF CALL 911. 25 each 6  . omeprazole (PRILOSEC) 20 MG capsule Take 20 mg by mouth daily.    . simvastatin (ZOCOR) 20 MG tablet Take 20 mg by mouth daily.     No current facility-administered medications for this visit.      Past Surgical History:  Procedure Laterality Date  . CARDIAC CATHETERIZATION     cardiac stent placed  . CAROTID ENDARTERECTOMY  2008   Right  . CHOLECYSTECTOMY N/A 03/28/2014   Procedure: LAPAROSCOPIC CHOLECYSTECTOMY;  Surgeon: Dalia Heading, MD;  Location: [facility];  Service: General;  Laterality: N/A;  . COLONOSCOPY  2008   adenoma resected in 1999; no intervention in 2008  . COLONOSCOPY WITH ESOPHAGOGASTRODUODENOSCOPY (EGD)  Feb 2010   Dr. Jena Gauss: mild erosive reflux esophagitis, non-critical Schatzki's ring without dilation, moderate sized hiatal hernia, bulbar erosions. Negative H.pylori serology. Colonoscopy with normal rectum, pancolonic diverticula, needs surveillance every 5 years due to history of adenomas.      No Known Allergies    Family History  Problem Relation Age of Onset  . Heart disease Father        Before age 47  .  Hyperlipidemia Father   . Hypertension Father   . Heart disease Brother   . Heart attack Brother   . Heart disease Brother        Before age 60-  Quad-BPG  . Hyperlipidemia Brother   . Hypertension Brother   . Heart attack Brother   . Stroke Other        Grandparent  . Heart attack Other        Grandparent  . Heart block Unknown        Brother  . Heart disease Paternal Grandmother   . Heart disease Paternal Grandfather   . Colon cancer Neg Hx      Social History Mr. Steuck reports that he has never smoked. He has never used smokeless tobacco. Mr. Denno reports that he does not drink alcohol.   Review of Systems CONSTITUTIONAL: No weight loss, fever, chills, weakness or fatigue.    HEENT: Eyes: No visual loss, blurred vision, double vision or yellow sclerae.No hearing loss, sneezing, congestion, runny nose or sore throat.  SKIN: No rash or itching.  CARDIOVASCULAR: per hpi RESPIRATORY: per hpi GASTROINTESTINAL: No anorexia, nausea, vomiting or diarrhea. No abdominal pain or blood.  GENITOURINARY: No burning on urination, no polyuria NEUROLOGICAL: No headache, dizziness, syncope, paralysis, ataxia, numbness or tingling in the extremities. No change in bowel or bladder control.  MUSCULOSKELETAL: No muscle, back pain, joint pain or stiffness.  LYMPHATICS: No enlarged nodes. No history of splenectomy.  PSYCHIATRIC: No history of depression or anxiety.  ENDOCRINOLOGIC: No reports of sweating, cold or heat intolerance. No polyuria or polydipsia.  .   Physical Examination Vitals:   04/13/17 0829  BP: 116/64  Pulse: 74  SpO2: 95%   Vitals:   04/13/17 0829  Weight: 184 lb (83.5 kg)  Height: 5' 9" (1.753 m)    Gen: resting comfortably, no acute distress HEENT: no scleral icterus, pupils equal round and reactive, no palptable cervical adenopathy,  CV: RRR, no m/r/g, no jvd Resp: Clear to auscultation bilaterally GI: abdomen is soft, non-tender, non-distended, normal bowel sounds, no hepatosplenomegaly MSK: extremities are warm, no edema.  Skin: warm, no rash Neuro:  no focal deficits Psych: appropriate affect   Diagnostic Studies 09/2005 Cath RESULTS: The aortic pressure was 122/62 with mean of 89 and left  ventricular pressure was 122/20.  LEFT MAIN CORONARY ARTERY: The left main coronary artery was free of  disease.  LEFT ANTERIOR DESCENDING ARTERY: The left anterior descending artery gave  rise to a large diagonal Hong Timm and two septal perforators. There was 70%  narrowing of the proximal LAD and 90% stenosis of the ostium of the  diagonal Hamad Whyte.  CIRCUMFLEX ARTERY: The circumflex artery gave rise to a ramus Yocheved Depner and  two marginal branches  and the posterolateral branches. The ramus Clydell Sposito was  completed occluded near its ostium within the stent. There was __________  ostial stenosis of the first marginal Kirat Mezquita.  RIGHT CORONARY ARTERY: The right coronary artery is a moderate size vessel  that gave rise to a right ventricular Andre Gallego, posterior descending Murray Guzzetta  and two posterolateral branches. There was 30% narrowing in the mid right  coronary artery.  LEFT VENTRICULOGRAM: The left ventriculogram performed in the RAO  projection showed akinesis of the lateral wall. The estimated ejection  fraction was 40-45%. The left ventriculogram performed in the LAO  projection showed akinesis of the posterior and inferolateral wall. The tip  of the apex and septum moved well and the superior portion of   the posterior  wall moved well.  Following aspiration thrombectomy and PTCA of the in-stent thrombotic lesion  in the ramus Marguita Venning of the circumflex artery the stenosis improved from 100%  to 0% and the flow improved from TIMI 0 to TIMI 3 flow. Myocardial blush  improved from grade 0 to grade 2.  The patient had the onset of chest pain at 12:35 and arrived at [facility]  emergency room at 12:42. The first ECG was obtained at 12:50 and this was  not diagnostic. The second ECG at 1325 was diagnostic for a posterior  infarction. The patient arrived at the cath lab at [facility] at 1510 and  the reperfusion was established with a diver catheter at 1552. This gave a  __________ time from the time of the diagnostic ECG of 2 hours and 27  minutes and a refusion time of 2 hours and 27 minutes.  CONCLUSION:  1. Acute posterior wall myocardial infarction due to stent thrombosis  within the stent in the ramus Shree Espey of circumflex artery with a total  occlusion of the ramus Jaella Weinert of the circumflex artery, 70% narrowing at  the ostium of the first marginal Layal Javid of the circumflex artery, 70%  narrowing of the proximal  LAD with 90% narrowing in the large diagonal  Mikaylee Arseneau, 30% narrowing in the mid right coronary and posterior and  inferolateral wall akinesis with an estimated ejection fraction of 40-  45%.  2. Successful aspiration thrombectomy and PTCA for in-stent thrombosis of  the ramus Graysen Woodyard of the circumflex artery with improvement in central  narrowing from 100% to 0%, improvement of flow from TIMI 0 to TIMI 3  flow.    Assessment and Plan   1. CAD/ICM - recent exertional SOB and chest pain concerning for cardiac chest pain - we will initially obtain echo, and likely we will plan for cath after for next week  2. HTN - bp is at goal, continue current meds  3. Carotid stenosis - continue regular follow up with vascular  4. Hyperlipidemia - continue low dose statin, prior LFT elevation on higher doses   I have reviewed the risks, indications, and alternatives to cardiac catheterization, possible angioplasty, and stenting with the patient today. Risks include but are not limited to bleeding, infection, vascular injury, stroke, myocardial infection, arrhythmia, kidney injury, radiation-related injury in the case of prolonged fluoroscopy use, emergency cardiac surgery, and death. The patient understands the risks of serious complication is 1-2 in 1000 with diagnostic cardiac cath and 1-2% or less with angioplasty/stenting.    Antoine Poche, M.D.

## 2017-04-19 NOTE — Interval H&P Note (Signed)
History and Physical Interval Note:  04/19/2017 2:11 PM  Kevin Reeves  has presented today for cardiac cath with the diagnosis of unstable angina. The various methods of treatment have been discussed with the patient and family. After consideration of risks, benefits and other options for treatment, the patient has consented to  Procedure(s): LEFT HEART CATH AND CORONARY ANGIOGRAPHY (N/A) as a surgical intervention .  The patient's history has been reviewed, patient examined, no change in status, stable for surgery.  I have reviewed the patient's chart and labs.  Questions were answered to the patient's satisfaction.    Cath Lab Visit (complete for each Cath Lab visit)  Clinical Evaluation Leading to the Procedure:   ACS: No.  Non-ACS:    Anginal Classification: CCS III  Anti-ischemic medical therapy: Minimal Therapy (1 class of medications)  Non-Invasive Test Results: No non-invasive testing performed  Prior CABG: No previous CABG         Verne Carrow

## 2017-04-19 NOTE — Progress Notes (Signed)
Ate [place] sandwich. Family in visiting. Waiting for tele bed assignment

## 2017-04-20 ENCOUNTER — Encounter (HOSPITAL_COMMUNITY): Payer: Self-pay | Admitting: Cardiovascular Disease

## 2017-04-20 DIAGNOSIS — I2510 Atherosclerotic heart disease of native coronary artery without angina pectoris: Secondary | ICD-10-CM

## 2017-04-20 DIAGNOSIS — E785 Hyperlipidemia, unspecified: Secondary | ICD-10-CM

## 2017-04-20 DIAGNOSIS — I10 Essential (primary) hypertension: Secondary | ICD-10-CM

## 2017-04-20 DIAGNOSIS — I6521 Occlusion and stenosis of right carotid artery: Secondary | ICD-10-CM

## 2017-04-20 DIAGNOSIS — I255 Ischemic cardiomyopathy: Secondary | ICD-10-CM

## 2017-04-20 DIAGNOSIS — I200 Unstable angina: Secondary | ICD-10-CM

## 2017-04-20 LAB — CBC
HCT: 36.3 % — ABNORMAL LOW (ref 39.0–52.0)
Hemoglobin: 11.3 g/dL — ABNORMAL LOW (ref 13.0–17.0)
MCH: 26.9 pg (ref 26.0–34.0)
MCHC: 31.1 g/dL (ref 30.0–36.0)
MCV: 86.4 fL (ref 78.0–100.0)
PLATELETS: 301 10*3/uL (ref 150–400)
RBC: 4.2 MIL/uL — ABNORMAL LOW (ref 4.22–5.81)
RDW: 15.4 % (ref 11.5–15.5)
WBC: 9.2 10*3/uL (ref 4.0–10.5)

## 2017-04-20 LAB — BASIC METABOLIC PANEL
Anion gap: 8 (ref 5–15)
BUN: 16 mg/dL (ref 6–20)
CALCIUM: 8.6 mg/dL — AB (ref 8.9–10.3)
CHLORIDE: 106 mmol/L (ref 101–111)
CO2: 22 mmol/L (ref 22–32)
CREATININE: 1.06 mg/dL (ref 0.61–1.24)
GFR calc Af Amer: >60 mL/min (ref >60)
GFR calc non Af Amer: >60 mL/min (ref >60)
Glucose, Bld: 106 mg/dL — ABNORMAL HIGH (ref 65–99)
Potassium: 4.2 mmol/L (ref 3.5–5.1)
SODIUM: 136 mmol/L (ref 135–145)

## 2017-04-20 NOTE — Consult Note (Signed)
[address]       [place] [address]             [phone]      Cardiothoracic Surgery Consultation  Reason for Consult: Severe multi-vessel CAD Referring Physician: Dr. Rondell Reams Kevin Reeves is an 76 y.o. male.  HPI:   The patient is a 75 year old gentleman with hypertension, hyperlipidemia, carotid artery disease s/p right CEA in 2008, and CAD s/p stent to RI in 2004 followed by posterior MI in 09/2005 after plavix stopped due to in stent thrombosis. This was opened and he has been maintained on Plavix since. He now presents with recent exertional shortness of breath which was his prior symptom before his stent. He has the SOB with walking any distance, doing dishes, taking trash out. He denies any chest pain or pressure, shortness of breath, or peripheral edema. He had an echo on 10/19 which showed normal LV systolic function, grade 2 diastolic dysfunction. The aortic valve was moderately calcified but leaflets appear to move fairly well and no gradient measured, trivial AI. Cardiac cath yesterday shows severe 3-vessel CAD as noted below.  Past Medical History:  Diagnosis Date  . Arteriosclerotic cardiovascular disease (ASCVD)    Stent to RI in 2004. Posterior MI in 4/07 with thrombosis of RI at stent site while off Plavix --> reintervention; do not d/c plavix  . Arthritis   . Cerebrovascular disease    Left carotid bruit with 40-59% ICA stenosis; right carotid endarectomy in 2008  . Cholelithiasis 07/21/2009   Asymptomatic  . Colonic polyp 1999   Adenoma resected in 1999; history of diverticulosis  . Coronary artery disease   . Gastroesophageal reflux disease    Dilation of esophagel stricture in 2008  . Hyperlipidemia   . Hypertension   . Myocardial infarction [facility] 09/2005    Past Surgical History:  Procedure Laterality Date  . CARDIAC CATHETERIZATION     cardiac stent placed  . CAROTID ENDARTERECTOMY  2008   Right  . CHOLECYSTECTOMY N/A  03/28/2014   Procedure: LAPAROSCOPIC CHOLECYSTECTOMY;  Surgeon: Jamesetta So, MD;  Location: [facility];  Service: General;  Laterality: N/A;  . COLONOSCOPY  2008   adenoma resected in 1999; no intervention in 2008  . COLONOSCOPY WITH ESOPHAGOGASTRODUODENOSCOPY (EGD)  Feb 2010   Dr. Gala Romney: mild erosive reflux esophagitis, non-critical Schatzki's ring without dilation, moderate sized hiatal hernia, bulbar erosions. Negative H.pylori serology. Colonoscopy with normal rectum, pancolonic diverticula, needs surveillance every 5 years due to history of adenomas.   Marland Kitchen LEFT HEART CATH AND CORONARY ANGIOGRAPHY N/A 04/19/2017   Procedure: LEFT HEART CATH AND CORONARY ANGIOGRAPHY;  Surgeon: Burnell Blanks, MD;  Location: [place] [facility];  Service: Cardiovascular;  Laterality: N/A;    Family History  Problem Relation Age of Onset  . Heart disease Father        Before age 42  . Hyperlipidemia Father   . Hypertension Father   . Heart disease Brother   . Heart attack Brother   . Heart disease Brother        Before age 37-  76  . Hyperlipidemia Brother   . Hypertension Brother   . Heart attack Brother   . Stroke Other        Grandparent  . Heart attack Other        Grandparent  . Heart block Unknown        Brother  . Heart  disease Paternal Grandmother   . Heart disease Paternal Grandfather   . Colon cancer Neg Hx     Social History:  reports that he has never smoked. He has never used smokeless tobacco. He reports that he does not drink alcohol or use drugs.  Allergies: No Known Allergies  Medications:  I have reviewed the patient's current medications. Prior to Admission:  Prescriptions Prior to Admission  Medication Sig Dispense Refill Last Dose  . aspirin EC 81 MG tablet Take 81 mg by mouth every evening.    04/19/2017 at 0900  . clopidogrel (PLAVIX) 75 MG tablet Take 75 mg by mouth daily.   04/19/2017 at 0900  . Krill Oil 300 MG CAPS Take 1 capsule by mouth daily.     04/18/2017 at Unknown time  . metoprolol succinate (TOPROL-XL) 25 MG 24 hr tablet Take 25 mg by mouth daily.     04/18/2017 at 0900  . Multiple Vitamin (MULTIVITAMIN) tablet Take 1 tablet by mouth daily.     04/18/2017 at 0900  . NITROSTAT 0.4 MG SL tablet DISSOLVE 1 TABLET UNDER  TONGUE EVERY 5 MINUTES UPTO 15 MIN FOR CHEST PAIN.IF NO RELIEF CALL 911. 25 each 6 never  . omeprazole (PRILOSEC) 20 MG capsule Take 20 mg by mouth daily.   04/18/2017 at 0900  . simvastatin (ZOCOR) 20 MG tablet Take 20 mg by mouth every evening.    04/18/2017 at 2100   Scheduled: . aspirin EC  81 mg Oral QPM  . metoprolol succinate  25 mg Oral Daily  . multivitamin with minerals  1 tablet Oral Daily  . pantoprazole  40 mg Oral Daily  . sodium chloride flush  3 mL Intravenous Q12H   Continuous: . sodium chloride     NOB:SJGGEZ chloride, acetaminophen, nitroGLYCERIN, ondansetron (ZOFRAN) IV, sodium chloride flush Anti-infectives    None      Results for orders placed or performed during the hospital encounter of 04/19/17 (from the past 48 hour(s))  Protime-INR     Status: None   Collection Time: 04/19/17 11:28 AM  Result Value Ref Range   Prothrombin Time 13.8 11.4 - 15.2 seconds   INR 6.62   Basic metabolic panel     Status: Abnormal   Collection Time: 04/20/17  3:40 AM  Result Value Ref Range   Sodium 136 135 - 145 mmol/L   Potassium 4.2 3.5 - 5.1 mmol/L   Chloride 106 101 - 111 mmol/L   CO2 22 22 - 32 mmol/L   Glucose, Bld 106 (H) 65 - 99 mg/dL   BUN 16 6 - 20 mg/dL   Creatinine, Ser 1.06 0.61 - 1.24 mg/dL   Calcium 8.6 (L) 8.9 - 10.3 mg/dL   GFR calc non Af Amer >60 >60 mL/min   GFR calc Af Amer >60 >60 mL/min    Comment: (NOTE) The eGFR has been calculated using the CKD EPI equation. This calculation has not been validated in all clinical situations. eGFR's persistently <60 mL/min signify possible Chronic Kidney Disease.    Anion gap 8 5 - 15  CBC     Status: Abnormal   Collection Time:  04/20/17  3:40 AM  Result Value Ref Range   WBC 9.2 4.0 - 10.5 K/uL   RBC 4.20 (L) 4.22 - 5.81 MIL/uL   Hemoglobin 11.3 (L) 13.0 - 17.0 g/dL   HCT 36.3 (L) 39.0 - 52.0 %   MCV 86.4 78.0 - 100.0 fL   MCH 26.9 26.0 - 34.0  pg   MCHC 31.1 30.0 - 36.0 g/dL   RDW 15.4 11.5 - 15.5 %   Platelets 301 150 - 400 K/uL    No results found.  Review of Systems  Constitutional: Negative.  Negative for malaise/fatigue.  HENT: Negative.   Eyes: Negative.   Respiratory: Positive for shortness of breath.   Cardiovascular: Negative for chest pain, orthopnea, leg swelling and PND.  Gastrointestinal: Negative.   Genitourinary: Negative.   Musculoskeletal: Negative.   Skin: Negative.   Neurological: Negative.   Endo/Heme/Allergies: Negative.   Psychiatric/Behavioral: Negative.    Blood pressure 110/81, pulse (!) 103, temperature 97.9 F (36.6 C), temperature source Oral, resp. rate 20, height _0  (1.727 m), weight 82.7 kg (182 lb 4.8 oz), SpO2 100 %. Physical Exam  Constitutional: He is oriented to person, place, and time. He appears well-developed and well-nourished. No distress.  HENT:  Head: Normocephalic and atraumatic.  Mouth/Throat: Oropharynx is clear and moist.  Eyes: Pupils are equal, round, and reactive to light. EOM are normal.  Neck: Normal range of motion. Neck supple. No JVD present. No thyromegaly present.  Cardiovascular: Normal rate, regular rhythm, normal heart sounds and intact distal pulses.   No murmur heard. Respiratory: Effort normal and breath sounds normal. No respiratory distress.  GI: Soft. Bowel sounds are normal. He exhibits no distension and no mass. There is no tenderness.  Musculoskeletal: Normal range of motion. He exhibits no edema.  Lymphadenopathy:    He has no cervical adenopathy.  Neurological: He is alert and oriented to person, place, and time. He has normal strength. No cranial nerve deficit or sensory deficit.  Skin: Skin is warm and dry.    Psychiatric: He has a normal mood and affect.   [place]  Order# [id number]  Reading physician: Burnell Blanks, MD Ordering physician: Burnell Blanks, MD Study date: 04/19/17  Physicians   Panel Physicians Referring Physician Case Authorizing Physician  Burnell Blanks, MD (Primary)    Procedures   LEFT HEART CATH AND CORONARY ANGIOGRAPHY  Conclusion     Mid RCA lesion, 80 %stenosed.  Dist RCA lesion, 60 %stenosed.  Ost Cx to Prox Cx lesion, 99 %stenosed.  Ramus-2 lesion, 95 %stenosed.  Ramus-1 lesion, 10 %stenosed.  Ost 1st Mrg to 1st Mrg lesion, 80 %stenosed.  Ost 2nd Mrg to 2nd Mrg lesion, 90 %stenosed.  Ost 2nd Diag to 2nd Diag lesion, 99 %stenosed.  Prox LAD to Mid LAD lesion, 90 %stenosed.  Mid LAD lesion, 80 %stenosed.  Dist LAD lesion, 70 %stenosed.  The left ventricular systolic function is normal.  LV end diastolic pressure is normal.  The left ventricular ejection fraction is 50-55% by visual estimate.  There is no mitral valve regurgitation.   1. Severe triple vessel CAD 2. Preserved LV systolic function  Recommendations: Will consult CT surgery for CABG. He has been on Plavix. Will need Plavix washout prior to surgery. He may be able to go home tomorrow if CABG is planned for next week.    Indications   Coronary artery disease involving native coronary artery of native heart with unstable angina pectoris ([place]) [I25.110 (ICD-10-CM)]  Procedural Details/Technique   Technical Details Indication: 76 yo male with history of CAD with prior PCI of the Intermediate branch in 2004 with stenting and repeat PCI of the intermediate branch in 2007 due to thrombosis of the stent, HTN, HLD with recent worsened dyspnea and chest pressure with exertion c/w unstable angina.  Procedure: The risks, benefits, complications, treatment options, and expected outcomes were discussed with the patient. The  patient and/or family concurred with the proposed plan, giving informed consent. The patient was brought to the cath lab after IV hydration was given. The patient was further sedated with Versed and Fentanyl. The right wrist was prepped and draped in a sterile fashion. 1% lidocaine was used for local anesthesia. Using the modified Seldinger access technique, a 5 French sheath was placed in the right radial artery. 3 mg Verapamil was given through the sheath. 4000 units IV heparin was given. Standard diagnostic catheters were used to perform selective coronary angiography. A pigtail catheter was used to perform a left ventricular angiogram. The sheath was removed from the right radial artery and a Terumo hemostasis band was applied at the arteriotomy site on the right wrist.     Estimated blood loss <50 mL.  During this procedure the patient was administered the following to achieve and maintain moderate conscious sedation: Versed 2 mg, Fentanyl 25 mcg, while the patient's heart rate, blood pressure, and oxygen saturation were continuously monitored. The period of conscious sedation was 30 minutes, of which I was present face-to-face 100% of this time.    Complications   Complications documented before study signed (04/20/2017 7:35 AM EDT)    LEFT HEART CATH AND CORONARY ANGIOGRAPHY   None Documented by Burnell Blanks, MD 04/19/2017 3:27 PM EDT  Time Range: Intra-procedure      Coronary Findings   Dominance: Right  Left Anterior Descending  Vessel is large.  Prox LAD to Mid LAD lesion, 90% stenosed.  Mid LAD lesion, 80% stenosed.  Dist LAD lesion, 70% stenosed.  First Diagonal Branch  Vessel is small in size.  First Septal Branch  Vessel is small in size.  Second Diagonal Branch  Vessel is moderate in size.  Ost 2nd Diag to 2nd Diag lesion, 99% stenosed.  Second Septal Branch  Vessel is small in size.  Third Diagonal Branch  Vessel is small in size.  Third Septal Branch   Vessel is small in size.  Ramus Intermedius  Vessel is large.  Ramus-1 lesion, 10% stenosed. The lesion was previously treated using a drug eluting stent over 2 years ago.  Ramus-2 lesion, 95% stenosed.  Left Circumflex  Vessel is moderate in size.  Ost Cx to Prox Cx lesion, 99% stenosed.  First Obtuse Marginal Branch  Vessel is moderate in size.  Ost 1st Mrg to 1st Mrg lesion, 80% stenosed.  Second Obtuse Marginal Branch  Vessel is moderate in size.  Ost 2nd Mrg to 2nd Mrg lesion, 90% stenosed.  Third Obtuse Marginal Branch  Vessel is small in size.  Right Coronary Artery  Mid RCA lesion, 80% stenosed.  Dist RCA lesion, 60% stenosed.  Wall Motion              Left Heart   Left Ventricle The left ventricular size is normal. The left ventricular systolic function is normal. LV end diastolic pressure is normal. The left ventricular ejection fraction is 50-55% by visual estimate. No regional wall motion abnormalities. There is no evidence of mitral regurgitation.    Coronary Diagrams   Diagnostic Diagram       Implants     No implant documentation for this case.  MERGE Images   Show images for CARDIAC CATHETERIZATION   Link to Procedure Log   Procedure Log    Hemo Data    Most Recent Value  AO Systolic  Pressure 798 mmHg  AO Diastolic Pressure 67 mmHg  AO Mean 99 mmHg  LV Systolic Pressure 102 mmHg  LV Diastolic Pressure 10 mmHg  LV EDP 20 mmHg  Arterial Occlusion Pressure Extended Systolic Pressure 548 mmHg  Arterial Occlusion Pressure Extended Diastolic Pressure 67 mmHg  Arterial Occlusion Pressure Extended Mean Pressure 102 mmHg  Left Ventricular Apex Extended Systolic Pressure 628 mmHg  Left Ventricular Apex Extended Diastolic Pressure 9 mmHg  Left Ventricular Apex Extended EDP Pressure 22 mmHg    Assessment/Plan:  This 76 year old gentleman has recent onset of exertional dyspnea which has been his anginal equivalent in the past. His cath shows  severe 3-vessel CAD with normal LV function. I agree that CABG is the best treatment for this gentleman. He had Plavix yesterday am but none since. I will plan to do surgery on Monday afternoon. Cardiology will decide if he needs to stay in the hospital until surgery. He has a history of carotid artery stenosis and prior right CEA in 2008. He had carotid duplex done in 11/2016 which showed 60-79% restenosis in the right ICA which had progressed since the prior study in 05/2016 when it was 40-59% stenoses. The left ICA had 60-79% stenosis and was unchanged from the prior study. He will have a repeat now to be sure that there has not been further progression over the past 4 months. I discussed the operative procedure with the patient and his wife including alternatives, benefits and risks; including but not limited to bleeding, blood transfusion, infection, stroke, myocardial infarction, graft failure, heart block requiring a permanent pacemaker, organ dysfunction, and death.  Lyndon Code understands and agrees to proceed.    I spent 60 minutes performing this consultation and > 50% of this time was spent face to face counseling and coordinating the care of this patient's severe multi-vessel coronary artery disease.  Gaye Pollack 04/20/2017, 3:35 PM

## 2017-04-20 NOTE — Progress Notes (Signed)
Progress Note  Patient Name: Kevin Reeves Date of Encounter: 04/20/2017  Primary Cardiologist: Wyline Mood  Subjective   Resting in bed comfortably. No chest pain or pressure. He really only noted exertional dyspnea.  Inpatient Medications    Scheduled Meds: . aspirin EC  81 mg Oral QPM  . metoprolol succinate  25 mg Oral Daily  . multivitamin with minerals  1 tablet Oral Daily  . pantoprazole  40 mg Oral Daily  . sodium chloride flush  3 mL Intravenous Q12H   Continuous Infusions: . sodium chloride     PRN Meds: sodium chloride, acetaminophen, nitroGLYCERIN, ondansetron (ZOFRAN) IV, sodium chloride flush   Vital Signs    Vitals:   04/19/17 1734 04/19/17 2211 04/20/17 0601 04/20/17 0925  BP: 121/68 121/62 (!) 105/56 110/81  Pulse: 71 60 72 (!) 103  Resp: 17 14 16    Temp: 97.8 F (36.6 C) 98.2 F (36.8 C) 98 F (36.7 C)   TempSrc: Oral Oral Oral   SpO2: 100% 100% 96%   Weight: 183 lb 9.6 oz (83.3 kg)  182 lb 4.8 oz (82.7 kg)   Height: 5\' 8"  (1.727 m)       Intake/Output Summary (Last 24 hours) at 04/20/17 1301 Last data filed at 04/19/17 1834  Gross per 24 hour  Intake           468.75 ml  Output                0 ml  Net           468.75 ml   Filed Weights   04/19/17 1110 04/19/17 1734 04/20/17 0601  Weight: 180 lb (81.6 kg) 183 lb 9.6 oz (83.3 kg) 182 lb 4.8 oz (82.7 kg)    Telemetry    Sinus rhythm - Personally Reviewed  ECG    None - Personally Reviewed  Physical Exam  * General: Well developed, well nourished, male appearing in no acute distress. Head: Normocephalic, atraumatic.  Neck: Supple without bruits, JVD or carotid bruit Lungs:  Resp regular and unlabored, CTA. Heart: RRR, S1, S2, no S3, S4, or murmur; no rub. Abdomen: Soft, non-tender, non-distended with normoactive bowel sounds. No hepatomegaly. No rebound/guarding. No obvious abdominal masses. Extremities: No clubbing, cyanosis, or  edema. Distal pedal pulses are 2+ bilaterally.  Right radial cath site with mild bruising Neuro: Alert and oriented X 3. Moves all extremities spontaneously. Psych: Normal affect.  Labs    Chemistry Recent Labs Lab 04/18/17 0925 04/20/17 0340  NA 138 136  K 4.4 4.2  CL 102 106  CO2 28 22  GLUCOSE 105* 106*  BUN 15 16  CREATININE 1.12 1.06  CALCIUM 9.4 8.6*  GFRNONAA >60 >60  GFRAA >60 >60  ANIONGAP 8 8     Hematology Recent Labs Lab 04/18/17 0925 04/20/17 0340  WBC 10.0 9.2  RBC 4.53 4.20*  HGB 12.6* 11.3*  HCT 39.8 36.3*  MCV 87.9 86.4  MCH 27.8 26.9  MCHC 31.7 31.1  RDW 15.0 15.4  PLT 337 301    Cardiac EnzymesNo results for input(s): TROPONINI in the last 168 hours. No results for input(s): TROPIPOC in the last 168 hours.   BNPNo results for input(s): BNP, PROBNP in the last 168 hours.   DDimer No results for input(s): DDIMER in the last 168 hours.    Radiology    No results found.  Cardiac Studies   Cath: 04/19/17  Conclusion     Mid RCA lesion, 80 %  stenosed.  Dist RCA lesion, 60 %stenosed.  Ost Cx to Prox Cx lesion, 99 %stenosed.  Ramus-2 lesion, 95 %stenosed.  Ramus-1 lesion, 10 %stenosed.  Ost 1st Mrg to 1st Mrg lesion, 80 %stenosed.  Ost 2nd Mrg to 2nd Mrg lesion, 90 %stenosed.  Ost 2nd Diag to 2nd Diag lesion, 99 %stenosed.  Prox LAD to Mid LAD lesion, 90 %stenosed.  Mid LAD lesion, 80 %stenosed.  Dist LAD lesion, 70 %stenosed.  The left ventricular systolic function is normal.  LV end diastolic pressure is normal.  The left ventricular ejection fraction is 50-55% by visual estimate.  There is no mitral valve regurgitation.   1. Severe triple vessel CAD 2. Preserved LV systolic function  Recommendations: Will consult CT surgery for CABG. He has been on Plavix. Will need Plavix washout prior to surgery. He may be able to go home tomorrow if CABG is planned for next week.    Patient Profile     76 y.o. male with PMH of CAD s/p multiple stents, ICM, HTN,  Carotid stenosis, and HL who was referred for outpatient cath by Dr. Wyline Mood. Underwent cath with Dr. Clifton James with severe 3v disease. TCTS consulted.  Assessment & Plan    1. CAD with Hx of multiple stents: Underwent cath with Dr. Clifton James yesterday showing severe 3v disease with preserved LV function. TCTS consulted for recommendations. Has been on plavix prior to admission. Further dispo pending TCTS recommendation.  --> Given the severity of his CAD with almost all major arteries and branches having at least 80-90% stenosis, he is probably safer to stay here in the hospital during the Plavix washout.  As long as he does not have active anginal symptoms, I think we are okay holding off on IV heparin.  Will need at least 5 day washout for Plavix.  Anticipate that he will be having his preop tests performed today and tomorrow.  He is on aspirin and Toprol.  2. HTN: stable with current therapy  3. Hx of ICM: 40-45% back in 2007 via cath. LV gram this admission showed normal EF - Will order 2-D echocardiogram  4. HL: statin stopped by GI 2/2 abnormal LFTs - may need to consider PCSK9 inhibitor postop  5. Carotid stenosis: prior right CEA; he will likely have carotid Dopplers prior to his CABG   Signed, Bryan Lemma, MD 04/20/2017, 1:01 PM  Pager # [phone]   For questions or updates, please contact [facility] Please consult [email or web address] for contact info under Cardiology/STEMI. Daytime calls, contact the Day Call APP (6a-8a) or assigned team (Teams A-D) provider (7:30a - 5p). All other daytime calls (7:30-5p), contact the Card Master @ [phone].   Nighttime calls, contact the assigned APP (5p-8p) or MD (6:30p-8p). Overnight calls (8p-6a), contact the on call Fellow @ [phone].

## 2017-04-21 ENCOUNTER — Inpatient Hospital Stay (HOSPITAL_COMMUNITY): Payer: Medicare HMO

## 2017-04-21 DIAGNOSIS — I340 Nonrheumatic mitral (valve) insufficiency: Secondary | ICD-10-CM

## 2017-04-21 LAB — PULMONARY FUNCTION TEST
DL/VA % pred: 78 %
DL/VA: 3.5 ml/min/mmHg/L
DLCO COR % PRED: 69 %
DLCO UNC % PRED: 62 %
DLCO UNC: 18.5 ml/min/mmHg
DLCO cor: 20.73 ml/min/mmHg
FEF 25-75 POST: 2.67 L/s
FEF 25-75 PRE: 3.17 L/s
FEF2575-%Change-Post: -15 %
FEF2575-%PRED-POST: 136 %
FEF2575-%PRED-PRE: 161 %
FEV1-%Change-Post: -6 %
FEV1-%PRED-POST: 107 %
FEV1-%Pred-Pre: 114 %
FEV1-Post: 2.94 L
FEV1-Pre: 3.16 L
FEV1FVC-%Change-Post: -4 %
FEV1FVC-%PRED-PRE: 111 %
FEV6-%CHANGE-POST: -1 %
FEV6-%PRED-POST: 107 %
FEV6-%PRED-PRE: 108 %
FEV6-POST: 3.84 L
FEV6-Pre: 3.88 L
FEV6FVC-%PRED-POST: 107 %
FEV6FVC-%Pred-Pre: 107 %
FVC-%CHANGE-POST: -2 %
FVC-%Pred-Post: 99 %
FVC-%Pred-Pre: 102 %
FVC-PRE: 3.94 L
FVC-Post: 3.84 L
POST FEV6/FVC RATIO: 100 %
PRE FEV1/FVC RATIO: 80 %
Post FEV1/FVC ratio: 77 %
Pre FEV6/FVC Ratio: 100 %
RV % pred: 112 %
RV: 2.79 L
TLC % PRED: 101 %
TLC: 6.72 L

## 2017-04-21 LAB — ECHOCARDIOGRAM LIMITED
Height: 68 in
Weight: 2881.6 oz

## 2017-04-21 MED ORDER — ALBUTEROL SULFATE (2.5 MG/3ML) 0.083% IN NEBU
2.5000 mg | INHALATION_SOLUTION | Freq: Once | RESPIRATORY_TRACT | Status: AC
  Administered 2017-04-21: 2.5 mg via RESPIRATORY_TRACT

## 2017-04-21 MED ORDER — ENOXAPARIN SODIUM 80 MG/0.8ML ~~LOC~~ SOLN
80.0000 mg | Freq: Two times a day (BID) | SUBCUTANEOUS | Status: AC
  Administered 2017-04-21 – 2017-04-23 (×5): 80 mg via SUBCUTANEOUS
  Filled 2017-04-21 (×4): qty 0.8

## 2017-04-21 NOTE — Progress Notes (Signed)
ANTICOAGULATION CONSULT NOTE - Initial Consult  Pharmacy Consult for enoxaparin Indication: ACS / NSTEMI / CP  No Known Allergies  Patient Measurements: Height: 5\' 8"  (172.7 cm) Weight: 180 lb 1.6 oz (81.7 kg) IBW/kg (Calculated) : 68.4  Assessment: 76 yo M presents on 10/24 for cardiac cath due to unstable angina. Hx of CAD with multiple stents. Cards consulted and recommend treatment dose Lovenox through the weekend. Hgb 11.3, plts wnl.  Goal of Therapy:  Anti-Xa level 0.6-1 units/ml 4hrs after LMWH dose given Monitor platelets by anticoagulation protocol: Yes   Plan:  Start enoxaparin 80mg  [place] Q12h Monitor CBC, s/s of bleed   11/24, PharmD, BCPS Clinical Pharmacist Pager [phone] 04/21/2017 10:02 PM

## 2017-04-21 NOTE — Progress Notes (Signed)
  2D Echocardiogram has been performed.  Delcie Roch 04/21/2017, 10:35 AM

## 2017-04-21 NOTE — Progress Notes (Signed)
2 Days Post-Op Procedure(s) (LRB): LEFT HEART CATH AND CORONARY ANGIOGRAPHY (N/A) Subjective:  Had a good night No chest pain or shortness of breath  Objective: Vital signs in last 24 hours: Temp:  [97.9 F (36.6 C)-98.1 F (36.7 C)] 98.1 F (36.7 C) (10/26 0148) Pulse Rate:  [66-103] 66 (10/26 0148) Cardiac Rhythm: Normal sinus rhythm (10/26 0750) Resp:  [18-20] 18 (10/26 0148) BP: (91-113)/(54-81) 113/61 (10/26 0611) SpO2:  [98 %-100 %] 98 % (10/26 0148) Weight:  [81.7 kg (180 lb 1.6 oz)] 81.7 kg (180 lb 1.6 oz) (10/26 0342)  Hemodynamic parameters for last 24 hours:    Intake/Output from previous day: 10/25 0701 - 10/26 0700 In: 900 [P.O.:900] Out: -  Intake/Output this shift: No intake/output data recorded.  General appearance: alert and cooperative Heart: regular rate and rhythm, S1, S2 normal, no murmur, click, rub or gallop Lungs: clear to auscultation bilaterally  Lab Results:  Recent Labs  04/18/17 0925 04/20/17 0340  WBC 10.0 9.2  HGB 12.6* 11.3*  HCT 39.8 36.3*  PLT 337 301   BMET:  Recent Labs  04/18/17 0925 04/20/17 0340  NA 138 136  K 4.4 4.2  CL 102 106  CO2 28 22  GLUCOSE 105* 106*  BUN 15 16  CREATININE 1.12 1.06  CALCIUM 9.4 8.6*    PT/INR:  Recent Labs  04/19/17 1128  LABPROT 13.8  INR 1.07   ABG No results found for: PHART, HCO3, TCO2, ACIDBASEDEF, O2SAT CBG (last 3)  No results for input(s): GLUCAP in the last 72 hours.  Assessment/Plan:  Severe multivessel coronary artery disease Plan CABG on Monday afternoon. He has no further questions at this time.  LOS: 2 days    Alleen Borne 04/21/2017

## 2017-04-21 NOTE — Progress Notes (Signed)
Progress Note  Patient Name: Kevin Reeves Date of Encounter: 04/21/2017  Primary Cardiologist: Wyline Mood  Subjective   Resting in bed comfortably. No chest pain or pressure. He really only noted exertional dyspnea.  Inpatient Medications    Scheduled Meds: . aspirin EC  81 mg Oral QPM  . metoprolol succinate  25 mg Oral Daily  . multivitamin with minerals  1 tablet Oral Daily  . pantoprazole  40 mg Oral Daily  . sodium chloride flush  3 mL Intravenous Q12H   Continuous Infusions: . sodium chloride     PRN Meds: sodium chloride, acetaminophen, nitroGLYCERIN, ondansetron (ZOFRAN) IV, sodium chloride flush   Vital Signs    Vitals:   04/21/17 0342 04/21/17 0611 04/21/17 1001 04/21/17 1400  BP:  113/61 119/67 105/68  Pulse:   82 97  Resp:    18  Temp:    97.6 F (36.4 C)  TempSrc:    Oral  SpO2:    100%  Weight: 180 lb 1.6 oz (81.7 kg)     Height:       No intake or output data in the 24 hours ending 04/21/17 2025 Filed Weights   04/19/17 1734 04/20/17 0601 04/21/17 0342  Weight: 183 lb 9.6 oz (83.3 kg) 182 lb 4.8 oz (82.7 kg) 180 lb 1.6 oz (81.7 kg)    Telemetry    Sinus rhythm - Personally Reviewed  ECG    None - Personally Reviewed  Physical Exam  * General: Well developed, well nourished, male appearing in no acute distress. Head: Normocephalic, atraumatic.  Neck: Supple without bruits, JVD or carotid bruit Lungs:  Resp regular and unlabored, CTA. Heart: RRR, S1, S2, no S3, S4, or murmur; no rub. Abdomen: Soft, non-tender, non-distended with normoactive bowel sounds. No hepatomegaly. No rebound/guarding. No obvious abdominal masses. Extremities: No clubbing, cyanosis, or  edema. Distal pedal pulses are 2+ bilaterally. Right radial cath site with mild bruising Neuro: Alert and oriented X 3. Moves all extremities spontaneously. Psych: Normal affect.  Labs    Chemistry  Recent Labs Lab 04/18/17 0925 04/20/17 0340  NA 138 136  K 4.4 4.2  CL  102 106  CO2 28 22  GLUCOSE 105* 106*  BUN 15 16  CREATININE 1.12 1.06  CALCIUM 9.4 8.6*  GFRNONAA >60 >60  GFRAA >60 >60  ANIONGAP 8 8     Hematology  Recent Labs Lab 04/18/17 0925 04/20/17 0340  WBC 10.0 9.2  RBC 4.53 4.20*  HGB 12.6* 11.3*  HCT 39.8 36.3*  MCV 87.9 86.4  MCH 27.8 26.9  MCHC 31.7 31.1  RDW 15.0 15.4  PLT 337 301    Cardiac EnzymesNo results for input(s): TROPONINI in the last 168 hours. No results for input(s): TROPIPOC in the last 168 hours.   BNPNo results for input(s): BNP, PROBNP in the last 168 hours.   DDimer No results for input(s): DDIMER in the last 168 hours.    Radiology    No results found.  Cardiac Studies   Cath: 04/19/17  Conclusion     Mid RCA lesion, 80 %stenosed.  Dist RCA lesion, 60 %stenosed.  Ost Cx to Prox Cx lesion, 99 %stenosed.  Ramus-2 lesion, 95 %stenosed.  Ramus-1 lesion, 10 %stenosed.  Ost 1st Mrg to 1st Mrg lesion, 80 %stenosed.  Ost 2nd Mrg to 2nd Mrg lesion, 90 %stenosed.  Ost 2nd Diag to 2nd Diag lesion, 99 %stenosed.  Prox LAD to Mid LAD lesion, 90 %stenosed.  Mid LAD lesion,  80 %stenosed.  Dist LAD lesion, 70 %stenosed.  The left ventricular systolic function is normal.  LV end diastolic pressure is normal.  The left ventricular ejection fraction is 50-55% by visual estimate.  There is no mitral valve regurgitation.   1. Severe triple vessel CAD 2. Preserved LV systolic function  Recommendations: Will consult CT surgery for CABG. He has been on Plavix. Will need Plavix washout prior to surgery. He may be able to go home tomorrow if CABG is planned for next week.    2D Echo 04/21/2017: Normal LV size and function.  EF 55-60%.  Akinesis of the basal-inferior myocardium.  GR 1 DD.  The wall motion abnormalities seen today is new  Patient Profile     76 y.o. male with PMH of CAD s/p multiple stents, ICM, HTN, Carotid stenosis, and HL who was referred for outpatient cath by Dr.  Wyline Mood. Underwent cath with Dr. Clifton James with severe 3v disease. TCTS consulted.  Assessment & Plan    1. CAD with Hx of multiple stents: Cath on October 24 showed multivessel disease -best suited for CABG.  Currently waiting for Plavix washout planned CABG on Monday  He is currently having his preop test done  Stable dose of aspirin and Toprol.  Discussed the case with Dr. Clifton James.  He was concerned about the extent of disease and feels like it would be appropriate to treat the patient with treatment dose enoxaparin over the weekend since he is no longer on Plavix.  This in conjunction with a new wall motion noted on echo is concerning for relatively recent ACS.  2. HTN: Stable on current meds  3. Hx of ICM: Echo just read today shows EF of 55-60%.  Grade 1 diastolic dysfunction.  4. HL: Statin was stopped due to GI abnormalities with abnormal LFTs.  We will need to consider PCSK9 as an outpatient  5. Carotid stenosis: Cardiac surgeons aware.   Signed, Bryan Lemma, MD 04/21/2017, 8:25 PM  Pager # [phone]   For questions or updates, please contact [facility] Please consult [email or web address] for contact info under Cardiology/STEMI. Daytime calls, contact the Day Call APP (6a-8a) or assigned team (Teams A-D) provider (7:30a - 5p). All other daytime calls (7:30-5p), contact the Card Master @ [phone].   Nighttime calls, contact the assigned APP (5p-8p) or MD (6:30p-8p). Overnight calls (8p-6a), contact the on call Fellow @ [phone].

## 2017-04-21 NOTE — Evaluation (Signed)
Physical Therapy Evaluation Patient Details Name: Kevin Reeves MRN: [id number] DOB: 26-Nov-1940 Today's Date: 04/21/2017   History of Present Illness  Patient is a 76 y/o male who presents for outpatient cardiac cath. Found to have severe multivessel CAD. Plan for CABG Monday. PMH includes MI, HTN, HLD. CAD, CVA.  Clinical Impression  Patient tolerated gait training and stair training with Mod I for safety. Pt with 2/4 DOE and HR stayed in low 100s bpm throughout mobility. Pt independent PTA. Education re: sternal precautions, mobility, exercise, etc. Pt has support of spouse at home. PLans to have CABG on Monday. Will follow up post op to make appropriate discharge recommendations. Encouraged ambulation over the weekend to maintain strength and to try to incorporate sternal precautions early to make it easier post op.    Follow Up Recommendations No PT follow up;Supervision - Intermittent (likely will change post surgery)    Equipment Recommendations  Rolling walker with 5" wheels    Recommendations for Other Services       Precautions / Restrictions Precautions Precautions: None Restrictions Weight Bearing Restrictions: No      Mobility  Bed Mobility Overal bed mobility: Modified Independent             General bed mobility comments: Able to get to EOB performing log roll technique and minimizing use of UEs to prepare for CABG. Performed x3. No assist needed, no use of rail.  Transfers Overall transfer level: Modified independent Equipment used: None             General transfer comment: Stood from EOB x5, from chair x1 without use of UEs to prepare for CABG.  Ambulation/Gait Ambulation/Gait assistance: Modified independent (Device/Increase time) Ambulation Distance (Feet): 400 Feet Assistive device: None Gait Pattern/deviations: Step-through pattern;Decreased stride length   Gait velocity interpretation: at or above normal speed for age/gender General Gait  Details: Slow, steady gait. HR in low 100s bpm during session. 2/4 DOE after stairs  Stairs Stairs: Yes Stairs assistance: Modified independent (Device/Increase time) Stair Management: One rail Left;Alternating pattern Number of Stairs: 8 General stair comments: USe of rail. MIld DOE present.  Wheelchair Mobility    Modified Rankin (Stroke Patients Only)       Balance Overall balance assessment: No apparent balance deficits (not formally assessed)                                           Pertinent Vitals/Pain Pain Assessment: No/denies pain    Home Living Family/patient expects to be discharged to:: Private residence Living Arrangements: Spouse/significant other Available Help at Discharge: Family;Available PRN/intermittently Type of Home: House Home Access: Stairs to enter Entrance Stairs-Rails: None Entrance Stairs-Number of Steps: 2 Home Layout: One level Home Equipment: Shower seat - built in      Prior Function Level of Independence: Independent         Comments: Rides tractor a lot, works on farm. Drives. Wife assists with some dressing.     Hand Dominance        Extremity/Trunk Assessment   Upper Extremity Assessment Upper Extremity Assessment: Defer to OT evaluation    Lower Extremity Assessment Lower Extremity Assessment: Overall WFL for tasks assessed       Communication   Communication: No difficulties  Cognition Arousal/Alertness: Awake/alert Behavior During Therapy: WFL for tasks assessed/performed Overall Cognitive Status: Within Functional Limits for tasks assessed  General Comments      Exercises     Assessment/Plan    PT Assessment Patient needs continued PT services  PT Problem List Decreased mobility;Cardiopulmonary status limiting activity;Decreased activity tolerance;Decreased balance       PT Treatment Interventions Therapeutic activities;Gait  training;Therapeutic exercise;Patient/family education;Balance training;Functional mobility training;Stair training    PT Goals (Current goals can be found in the Care Plan section)  Acute Rehab PT Goals Patient Stated Goal: 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 : None Difficulty sitting down on and standing up from a chair with arms (e.g., wheelchair, bedside commode, etc,.)?: None Help needed moving to and from a bed to chair (including a wheelchair)?: None Help needed walking in hospital room?: None Help needed climbing 3-5 steps with a railing? : A Little 6 Click Score: 23    End of Session   Activity Tolerance: Patient tolerated treatment well Patient left: in bed;with call bell/phone within reach Nurse Communication: Mobility status PT Visit Diagnosis: Other abnormalities of gait and mobility (R26.89)    Time: 2620-3559 PT Time Calculation (min) (ACUTE ONLY): 19 min   Charges:   PT Evaluation $PT Eval Low Complexity: 1 Low     PT G CodesMylo Red, PT, DPT [phone]    Emy Angevine A Thomas Mabry 04/21/2017, 1:10 PM

## 2017-04-22 ENCOUNTER — Inpatient Hospital Stay (HOSPITAL_COMMUNITY): Payer: Medicare HMO

## 2017-04-22 DIAGNOSIS — Z01810 Encounter for preprocedural cardiovascular examination: Secondary | ICD-10-CM

## 2017-04-22 NOTE — Progress Notes (Signed)
Progress Note  Patient Name: Kevin Reeves Date of Encounter: 04/22/2017  Primary Cardiologist: Kevin Reeves  Subjective   Currently no chest pain or shortness of breath. Is up walking around his hospital room without complaint.  Inpatient Medications    Scheduled Meds: . aspirin EC  81 mg Oral QPM  . enoxaparin (LOVENOX) injection  80 mg Subcutaneous Q12H  . metoprolol succinate  25 mg Oral Daily  . multivitamin with minerals  1 tablet Oral Daily  . pantoprazole  40 mg Oral Daily  . sodium chloride flush  3 mL Intravenous Q12H   Continuous Infusions: . sodium chloride     PRN Meds: sodium chloride, acetaminophen, nitroGLYCERIN, ondansetron (ZOFRAN) IV, sodium chloride flush   Vital Signs    Vitals:   04/21/17 2029 04/22/17 0500 04/22/17 0814 04/22/17 1017  BP: 108/66 (!) 104/50 117/72 (!) 105/55  Pulse: 72 77 84 72  Resp: 18 18 18 18   Temp: 98 F (36.7 C) 97.9 F (36.6 C) 97.8 F (36.6 C)   TempSrc: Oral Oral Oral   SpO2: 98% 99% 97% 98%  Weight:  179 lb 14.4 oz (81.6 kg)    Height:       No intake or output data in the 24 hours ending 04/22/17 1043 Filed Weights   04/20/17 0601 04/21/17 0342 04/22/17 0500  Weight: 182 lb 4.8 oz (82.7 kg) 180 lb 1.6 oz (81.7 kg) 179 lb 14.4 oz (81.6 kg)    Telemetry    sinus rhythm - Personally Reviewed  ECG    None new - Personally Reviewed  Physical Exam   GEN: Well nourished, well developed, in no acute distress  HEENT: normal  Neck: no JVD, carotid bruits, or masses Cardiac: RRR; no murmurs, rubs, or gallops,no edema  Respiratory:  clear to auscultation bilaterally, normal work of breathing GI: soft, nontender, nondistended, + BS MS: no deformity or atrophy  Skin: warm and dry Neuro:  Strength and sensation are intact Psych: euthymic Reeves, full affect   Labs    Chemistry  Recent Labs Lab 04/18/17 0925 04/20/17 0340  NA 138 136  K 4.4 4.2  CL 102 106  CO2 28 22  GLUCOSE 105* 106*  BUN 15 16    CREATININE 1.12 1.06  CALCIUM 9.4 8.6*  GFRNONAA >60 >60  GFRAA >60 >60  ANIONGAP 8 8     Hematology  Recent Labs Lab 04/18/17 0925 04/20/17 0340  WBC 10.0 9.2  RBC 4.53 4.20*  HGB 12.6* 11.3*  HCT 39.8 36.3*  MCV 87.9 86.4  MCH 27.8 26.9  MCHC 31.7 31.1  RDW 15.0 15.4  PLT 337 301    Cardiac EnzymesNo results for input(s): TROPONINI in the last 168 hours. No results for input(s): TROPIPOC in the last 168 hours.   BNPNo results for input(s): BNP, PROBNP in the last 168 hours.   DDimer No results for input(s): DDIMER in the last 168 hours.    Radiology    No results found.  Cardiac Studies   Cath: 04/19/17  Conclusion     Mid RCA lesion, 80 %stenosed.  Dist RCA lesion, 60 %stenosed.  Ost Cx to Prox Cx lesion, 99 %stenosed.  Ramus-2 lesion, 95 %stenosed.  Ramus-1 lesion, 10 %stenosed.  Ost 1st Mrg to 1st Mrg lesion, 80 %stenosed.  Ost 2nd Mrg to 2nd Mrg lesion, 90 %stenosed.  Ost 2nd Diag to 2nd Diag lesion, 99 %stenosed.  Prox LAD to Mid LAD lesion, 90 %stenosed.  Mid LAD lesion,  80 %stenosed.  Dist LAD lesion, 70 %stenosed.  The left ventricular systolic function is normal.  LV end diastolic pressure is normal.  The left ventricular ejection fraction is 50-55% by visual estimate.  There is no mitral valve regurgitation.   1. Severe triple vessel CAD 2. Preserved LV systolic function  Recommendations: Kevin Reeves consult CT surgery for CABG. He has been on Kevin Reeves. Kevin Reeves need Kevin Reeves washout prior to surgery. He may be able to go home tomorrow if CABG is planned for next week.    2D Echo 04/21/2017: Normal LV size and function.  EF 55-60%.  Akinesis of the basal-inferior myocardium.  GR 1 DD.  The wall motion abnormalities seen today is new  Patient Profile     76 y.o. male with PMH of CAD s/p multiple stents, ICM, HTN, Carotid stenosis, and HL who was referred for outpatient cath by Dr. Wyline Reeves. Underwent cath with Dr. Clifton Reeves with severe 3v  disease. TCTS consulted.  Assessment & Plan    1. CAD with Hx of multiple stents: Cath on October 24 showed multivessel disease -best suited for CABG.  preop testing completed.  Waiting for Kevin Reeves washout on enoxaparin.  Plan for CABG Monday.  2. HTN: stable on current medications  3. Hx of ICM: ejection fraction is improved to 50-55% with grade 1 diastolic dysfunction  4. HL: statin stopped due to abnormal LFTs. PCSK9 as outpatient.  5. Carotid stenosis: cardiac surgeons aware   Signed, Kevin Fly Jorja Loa, MD 04/22/2017, 10:43 AM  Pager # [phone]   For questions or updates, please contact [facility] Please consult [email or web address] for contact info under Cardiology/STEMI. Daytime calls, contact the Day Call APP (6a-8a) or assigned team (Teams A-D) provider (7:30a - 5p). All other daytime calls (7:30-5p), contact the Card Master @ [phone].   Nighttime calls, contact the assigned APP (5p-8p) or MD (6:30p-8p). Overnight calls (8p-6a), contact the on call Fellow @ [phone].

## 2017-04-22 NOTE — Plan of Care (Signed)
Problem: Pain Management Goal: General experience of comfort will improve Outcome: Progressing Pt denied chest pain this shift. Will continue to monitor

## 2017-04-23 ENCOUNTER — Inpatient Hospital Stay (HOSPITAL_COMMUNITY): Payer: Medicare HMO

## 2017-04-23 MED ORDER — VANCOMYCIN HCL 10 G IV SOLR
1250.0000 mg | INTRAVENOUS | Status: AC
  Administered 2017-04-24: 1250 mg via INTRAVENOUS
  Filled 2017-04-23: qty 1250

## 2017-04-23 MED ORDER — CHLORHEXIDINE GLUCONATE CLOTH 2 % EX PADS
6.0000 | MEDICATED_PAD | Freq: Once | CUTANEOUS | Status: AC
  Administered 2017-04-24: 6 via TOPICAL

## 2017-04-23 MED ORDER — MAGNESIUM SULFATE 50 % IJ SOLN
40.0000 meq | INTRAMUSCULAR | Status: DC
  Filled 2017-04-23 (×3): qty 9.85

## 2017-04-23 MED ORDER — TRANEXAMIC ACID 1000 MG/10ML IV SOLN
1.5000 mg/kg/h | INTRAVENOUS | Status: AC
  Administered 2017-04-24: 1.5 mg/kg/h via INTRAVENOUS
  Filled 2017-04-23: qty 25

## 2017-04-23 MED ORDER — METOPROLOL TARTRATE 12.5 MG HALF TABLET
12.5000 mg | ORAL_TABLET | Freq: Once | ORAL | Status: DC
  Filled 2017-04-23: qty 1

## 2017-04-23 MED ORDER — CHLORHEXIDINE GLUCONATE CLOTH 2 % EX PADS
6.0000 | MEDICATED_PAD | Freq: Once | CUTANEOUS | Status: AC
  Administered 2017-04-23: 6 via TOPICAL

## 2017-04-23 MED ORDER — DEXMEDETOMIDINE HCL IN NACL 400 MCG/100ML IV SOLN
0.1000 ug/kg/h | INTRAVENOUS | Status: AC
  Administered 2017-04-24: .2 ug/kg/h via INTRAVENOUS
  Filled 2017-04-23: qty 100

## 2017-04-23 MED ORDER — TRANEXAMIC ACID (OHS) BOLUS VIA INFUSION
15.0000 mg/kg | INTRAVENOUS | Status: AC
  Administered 2017-04-24: 1228.5 mg via INTRAVENOUS
  Filled 2017-04-23: qty 1229

## 2017-04-23 MED ORDER — SODIUM CHLORIDE 0.9 % IV SOLN
INTRAVENOUS | Status: DC
  Filled 2017-04-23: qty 30

## 2017-04-23 MED ORDER — CEFUROXIME SODIUM 750 MG IJ SOLR
750.0000 mg | INTRAMUSCULAR | Status: DC
  Filled 2017-04-23: qty 750

## 2017-04-23 MED ORDER — EPINEPHRINE PF 1 MG/ML IJ SOLN
0.0000 ug/min | INTRAVENOUS | Status: DC
  Filled 2017-04-23: qty 4

## 2017-04-23 MED ORDER — TRANEXAMIC ACID (OHS) PUMP PRIME SOLUTION
2.0000 mg/kg | INTRAVENOUS | Status: DC
  Filled 2017-04-23 (×2): qty 1.64

## 2017-04-23 MED ORDER — SODIUM CHLORIDE 0.9 % IV SOLN
INTRAVENOUS | Status: AC
  Administered 2017-04-24: .8 [IU]/h via INTRAVENOUS
  Filled 2017-04-23: qty 1

## 2017-04-23 MED ORDER — DIAZEPAM 5 MG PO TABS
5.0000 mg | ORAL_TABLET | Freq: Once | ORAL | Status: AC
  Administered 2017-04-24: 5 mg via ORAL
  Filled 2017-04-23: qty 1

## 2017-04-23 MED ORDER — CHLORHEXIDINE GLUCONATE 0.12 % MT SOLN
15.0000 mL | Freq: Once | OROMUCOSAL | Status: AC
  Administered 2017-04-24: 15 mL via OROMUCOSAL
  Filled 2017-04-23: qty 15

## 2017-04-23 MED ORDER — DEXTROSE 5 % IV SOLN
1.5000 g | INTRAVENOUS | Status: AC
  Administered 2017-04-24: 1.5 g via INTRAVENOUS
  Administered 2017-04-24: .75 g via INTRAVENOUS
  Filled 2017-04-23: qty 1.5

## 2017-04-23 MED ORDER — PLASMA-LYTE 148 IV SOLN
INTRAVENOUS | Status: AC
  Administered 2017-04-24: 500 mL
  Filled 2017-04-23 (×2): qty 2.5

## 2017-04-23 MED ORDER — PHENYLEPHRINE HCL 10 MG/ML IJ SOLN
30.0000 ug/min | INTRAMUSCULAR | Status: DC
  Filled 2017-04-23: qty 2

## 2017-04-23 MED ORDER — BISACODYL 5 MG PO TBEC
5.0000 mg | DELAYED_RELEASE_TABLET | Freq: Once | ORAL | Status: AC
  Administered 2017-04-23: 5 mg via ORAL
  Filled 2017-04-23: qty 1

## 2017-04-23 MED ORDER — NITROGLYCERIN IN D5W 200-5 MCG/ML-% IV SOLN
2.0000 ug/min | INTRAVENOUS | Status: DC
  Filled 2017-04-23: qty 250

## 2017-04-23 MED ORDER — TEMAZEPAM 15 MG PO CAPS: 15.0000 mg | ORAL_CAPSULE | Freq: Once | ORAL | Status: DC | PRN

## 2017-04-23 MED ORDER — DOPAMINE-DEXTROSE 3.2-5 MG/ML-% IV SOLN
0.0000 ug/kg/min | INTRAVENOUS | Status: DC
  Filled 2017-04-23: qty 250

## 2017-04-23 MED ORDER — POTASSIUM CHLORIDE 2 MEQ/ML IV SOLN
80.0000 meq | INTRAVENOUS | Status: DC
  Filled 2017-04-23: qty 40

## 2017-04-23 NOTE — Progress Notes (Signed)
Progress Note  Patient Name: Kevin Reeves Date of Encounter: 04/23/2017  Primary Cardiologist: Wyline Mood  Subjective   Continuing to feel well without chest pain or shortness of breath. This continued to walk around the room without complaint.  Inpatient Medications    Scheduled Meds: . aspirin EC  81 mg Oral QPM  . enoxaparin (LOVENOX) injection  80 mg Subcutaneous Q12H  . metoprolol succinate  25 mg Oral Daily  . multivitamin with minerals  1 tablet Oral Daily  . pantoprazole  40 mg Oral Daily  . sodium chloride flush  3 mL Intravenous Q12H   Continuous Infusions: . sodium chloride     PRN Meds: sodium chloride, acetaminophen, nitroGLYCERIN, ondansetron (ZOFRAN) IV, sodium chloride flush   Vital Signs    Vitals:   04/22/17 1017 04/22/17 1329 04/22/17 1948 04/23/17 0539  BP: (!) 105/55 (!) 115/57 (!) 132/56 (!) 98/59  Pulse: 72 65 65 93  Resp: 18 18 18 18   Temp:  97.8 F (36.6 C) 98.4 F (36.9 C) 98.1 F (36.7 C)  TempSrc:  Oral Oral Oral  SpO2: 98% 99% 100% 100%  Weight:    180 lb 8 oz (81.9 kg)  Height:        Intake/Output Summary (Last 24 hours) at 04/23/17 0932 Last data filed at 04/22/17 1700  Gross per 24 hour  Intake              240 ml  Output                0 ml  Net              240 ml   Filed Weights   04/21/17 0342 04/22/17 0500 04/23/17 0539  Weight: 180 lb 1.6 oz (81.7 kg) 179 lb 14.4 oz (81.6 kg) 180 lb 8 oz (81.9 kg)    Telemetry    Sinus rhythm - Personally Reviewed  ECG    None known - Personally Reviewed  Physical Exam   GEN: Well nourished, well developed, in no acute distress  HEENT: normal  Neck: no JVD Cardiac: RRR; no murmurs, rubs, or gallops,no edema  Respiratory:  clear to auscultation bilaterally, normal work of breathing GI: soft, nontender, nondistended, + BS MS: no deformity or atrophy  Skin: warm and dry Neuro:  Strength and sensation are intact Psych: euthymic mood, full affect  Labs     Chemistry  Recent Labs Lab 04/18/17 0925 04/20/17 0340  NA 138 136  K 4.4 4.2  CL 102 106  CO2 28 22  GLUCOSE 105* 106*  BUN 15 16  CREATININE 1.12 1.06  CALCIUM 9.4 8.6*  GFRNONAA >60 >60  GFRAA >60 >60  ANIONGAP 8 8     Hematology  Recent Labs Lab 04/18/17 0925 04/20/17 0340  WBC 10.0 9.2  RBC 4.53 4.20*  HGB 12.6* 11.3*  HCT 39.8 36.3*  MCV 87.9 86.4  MCH 27.8 26.9  MCHC 31.7 31.1  RDW 15.0 15.4  PLT 337 301    Cardiac EnzymesNo results for input(s): TROPONINI in the last 168 hours. No results for input(s): TROPIPOC in the last 168 hours.   BNPNo results for input(s): BNP, PROBNP in the last 168 hours.   DDimer No results for input(s): DDIMER in the last 168 hours.    Radiology    No results found.  Cardiac Studies   Cath: 04/19/17  Conclusion     Mid RCA lesion, 80 %stenosed.  Dist RCA lesion, 60 %stenosed.  Ost Cx to Prox Cx lesion, 99 %stenosed.  Ramus-2 lesion, 95 %stenosed.  Ramus-1 lesion, 10 %stenosed.  Ost 1st Mrg to 1st Mrg lesion, 80 %stenosed.  Ost 2nd Mrg to 2nd Mrg lesion, 90 %stenosed.  Ost 2nd Diag to 2nd Diag lesion, 99 %stenosed.  Prox LAD to Mid LAD lesion, 90 %stenosed.  Mid LAD lesion, 80 %stenosed.  Dist LAD lesion, 70 %stenosed.  The left ventricular systolic function is normal.  LV end diastolic pressure is normal.  The left ventricular ejection fraction is 50-55% by visual estimate.  There is no mitral valve regurgitation.   1. Severe triple vessel CAD 2. Preserved LV systolic function  Recommendations: Nanami Whitelaw consult CT surgery for CABG. He has been on Plavix. Vanesha Athens need Plavix washout prior to surgery. He may be able to go home tomorrow if CABG is planned for next week.    2D Echo 04/21/2017: Normal LV size and function.  EF 55-60%.  Akinesis of the basal-inferior myocardium.  GR 1 DD.  The wall motion abnormalities seen today is new  Patient Profile     76 y.o. male with PMH of CAD s/p  multiple stents, ICM, HTN, Carotid stenosis, and HL who was referred for outpatient cath by Dr. Wyline Mood. Underwent cath with Dr. Clifton James with severe 3v disease. TCTS consulted.  Assessment & Plan    1. CAD with Hx of multiple stents: Cath on October 24 showed multivessel disease -best suited for CABG.  Preop testing for CABG completed  Currently on a Lovenox awaiting Plavix washout  For CABG tomorrow  2. HTN: Stable on current medications   3. Hx of ICM: His ejection fraction improved to 50-55% with grade 1 diastolic dysfunction. No signs of volume overload.  4. HL: Jamier Urbas need PCSK9 as outpatient  5. Carotid stenosis: The cardiac surgeons aware   Signed, Chrishun Scheer Jorja Loa, MD 04/23/2017, 9:32 AM  Pager # [phone]   For questions or updates, please contact [facility] Please consult [email or web address] for contact info under Cardiology/STEMI. Daytime calls, contact the Day Call APP (6a-8a) or assigned team (Teams A-D) provider (7:30a - 5p). All other daytime calls (7:30-5p), contact the Card Master @ [phone].   Nighttime calls, contact the assigned APP (5p-8p) or MD (6:30p-8p). Overnight calls (8p-6a), contact the on call Fellow @ [phone].

## 2017-04-23 NOTE — Plan of Care (Signed)
Problem: Education: Goal: Knowledge of [facility] Education information/materials will improve Outcome: Progressing Pt and wife educated on what to expect following scheduled procedure. Reading materials were given. Pt and wife verbalized understanding.  Problem: Pain Management Goal: General experience of comfort will improve Outcome: Progressing Pt denies any discomfort or additional needs at this time.

## 2017-04-23 NOTE — Plan of Care (Signed)
Problem: Pain Management Goal: General experience of comfort will improve Outcome: Progressing Pt denies any discomfort or pain at this time.

## 2017-04-24 ENCOUNTER — Inpatient Hospital Stay (HOSPITAL_COMMUNITY): Payer: Medicare HMO | Admitting: Anesthesiology

## 2017-04-24 ENCOUNTER — Encounter (HOSPITAL_COMMUNITY): Payer: Self-pay | Admitting: Certified Registered Nurse Anesthetist

## 2017-04-24 ENCOUNTER — Inpatient Hospital Stay (HOSPITAL_COMMUNITY): Payer: Medicare HMO

## 2017-04-24 ENCOUNTER — Inpatient Hospital Stay (HOSPITAL_COMMUNITY)
Admission: AD | Disposition: A | Discharge status: Home / Self Care | Payer: Self-pay | Source: Ambulatory Visit | Attending: Cardiovascular Disease

## 2017-04-24 DIAGNOSIS — I2510 Atherosclerotic heart disease of native coronary artery without angina pectoris: Secondary | ICD-10-CM | POA: Diagnosis present

## 2017-04-24 DIAGNOSIS — I25110 Atherosclerotic heart disease of native coronary artery with unstable angina pectoris: Secondary | ICD-10-CM

## 2017-04-24 HISTORY — PX: TEE WITHOUT CARDIOVERSION: SHX5443

## 2017-04-24 HISTORY — PX: CORONARY ARTERY BYPASS GRAFT: SHX141

## 2017-04-24 LAB — POCT I-STAT 3, ART BLOOD GAS (G3+)
ACID-BASE DEFICIT: 2 mmol/L (ref 0.0–2.0)
Acid-Base Excess: 2 mmol/L (ref 0.0–2.0)
BICARBONATE: 24 mmol/L (ref 20.0–28.0)
BICARBONATE: 23.4 mmol/L (ref 20.0–28.0)
Bicarbonate: 26 mmol/L (ref 20.0–28.0)
O2 Saturation: 100 %
O2 Saturation: 100 %
O2 Saturation: 100 %
PCO2 ART: 36.4 mmHg (ref 32.0–48.0)
PCO2 ART: 37.1 mmHg (ref 32.0–48.0)
PH ART: 7.462 — AB (ref 7.350–7.450)
PH ART: 7.418 (ref 7.350–7.450)
PH ART: 7.368 (ref 7.350–7.450)
PO2 ART: 350 mmHg — AB (ref 83.0–108.0)
TCO2: 27 mmol/L (ref 22–32)
TCO2: 25 mmol/L (ref 22–32)
TCO2: 25 mmol/L (ref 22–32)
pCO2 arterial: 40.2 mmHg (ref 32.0–48.0)
pO2, Arterial: 412 mmHg — ABNORMAL HIGH (ref 83.0–108.0)
pO2, Arterial: 173 mmHg — ABNORMAL HIGH (ref 83.0–108.0)

## 2017-04-24 LAB — POCT I-STAT, CHEM 8
BUN: 18 mg/dL (ref 6–20)
BUN: 16 mg/dL (ref 6–20)
BUN: 17 mg/dL (ref 6–20)
BUN: 17 mg/dL (ref 6–20)
BUN: 15 mg/dL (ref 6–20)
CALCIUM ION: 1.02 mmol/L — AB (ref 1.15–1.40)
CHLORIDE: 105 mmol/L (ref 101–111)
CHLORIDE: 105 mmol/L (ref 101–111)
CHLORIDE: 104 mmol/L (ref 101–111)
CHLORIDE: 104 mmol/L (ref 101–111)
CREATININE: 0.8 mg/dL (ref 0.61–1.24)
CREATININE: 0.9 mg/dL (ref 0.61–1.24)
Calcium, Ion: 1.22 mmol/L (ref 1.15–1.40)
Calcium, Ion: 1.22 mmol/L (ref 1.15–1.40)
Calcium, Ion: 1.08 mmol/L — ABNORMAL LOW (ref 1.15–1.40)
Calcium, Ion: 1.04 mmol/L — ABNORMAL LOW (ref 1.15–1.40)
Chloride: 105 mmol/L (ref 101–111)
Creatinine, Ser: 0.8 mg/dL (ref 0.61–1.24)
Creatinine, Ser: 0.7 mg/dL (ref 0.61–1.24)
Creatinine, Ser: 0.8 mg/dL (ref 0.61–1.24)
GLUCOSE: 103 mg/dL — AB (ref 65–99)
GLUCOSE: 106 mg/dL — AB (ref 65–99)
Glucose, Bld: 98 mg/dL (ref 65–99)
Glucose, Bld: 138 mg/dL — ABNORMAL HIGH (ref 65–99)
Glucose, Bld: 146 mg/dL — ABNORMAL HIGH (ref 65–99)
HCT: 26 % — ABNORMAL LOW (ref 39.0–52.0)
HCT: 29 % — ABNORMAL LOW (ref 39.0–52.0)
HEMATOCRIT: 35 % — AB (ref 39.0–52.0)
HEMATOCRIT: 25 % — AB (ref 39.0–52.0)
HEMATOCRIT: 26 % — AB (ref 39.0–52.0)
HEMOGLOBIN: 8.8 g/dL — AB (ref 13.0–17.0)
HEMOGLOBIN: 8.5 g/dL — AB (ref 13.0–17.0)
Hemoglobin: 11.9 g/dL — ABNORMAL LOW (ref 13.0–17.0)
Hemoglobin: 9.9 g/dL — ABNORMAL LOW (ref 13.0–17.0)
Hemoglobin: 8.8 g/dL — ABNORMAL LOW (ref 13.0–17.0)
POTASSIUM: 4.2 mmol/L (ref 3.5–5.1)
POTASSIUM: 4.2 mmol/L (ref 3.5–5.1)
POTASSIUM: 5.5 mmol/L — AB (ref 3.5–5.1)
POTASSIUM: 5.5 mmol/L — AB (ref 3.5–5.1)
POTASSIUM: 4.8 mmol/L (ref 3.5–5.1)
SODIUM: 139 mmol/L (ref 135–145)
SODIUM: 138 mmol/L (ref 135–145)
Sodium: 139 mmol/L (ref 135–145)
Sodium: 140 mmol/L (ref 135–145)
Sodium: 137 mmol/L (ref 135–145)
TCO2: 27 mmol/L (ref 22–32)
TCO2: 26 mmol/L (ref 22–32)
TCO2: 27 mmol/L (ref 22–32)
TCO2: 25 mmol/L (ref 22–32)
TCO2: 24 mmol/L (ref 22–32)

## 2017-04-24 LAB — BASIC METABOLIC PANEL
ANION GAP: 8 (ref 5–15)
BUN: 17 mg/dL (ref 6–20)
CALCIUM: 9 mg/dL (ref 8.9–10.3)
CHLORIDE: 104 mmol/L (ref 101–111)
CO2: 25 mmol/L (ref 22–32)
Creatinine, Ser: 1.1 mg/dL (ref 0.61–1.24)
GFR calc non Af Amer: >60 mL/min (ref >60)
GLUCOSE: 99 mg/dL (ref 65–99)
POTASSIUM: 4.4 mmol/L (ref 3.5–5.1)
Sodium: 137 mmol/L (ref 135–145)

## 2017-04-24 LAB — ECHO TEE
AOVTI: 37.3 cm
AV Area VTI: 1.68 cm2
AV Mean grad: 5 mmHg
AV pk vel: 1.51 cm/s
AVAREAMEANV: 1 cm2
AVPG: 9 mmHg
Annulus: 2.4 cm
Ao-prox: 3
LVOTD: 2.4 mm
SINUS: 3.2 cm
STJ: 2.9 cm
Valve area: 1.62 cm2

## 2017-04-24 LAB — PROTIME-INR
INR: 1.41
Prothrombin Time: 17.1 seconds — ABNORMAL HIGH (ref 11.4–15.2)

## 2017-04-24 LAB — CBC
HEMATOCRIT: 38.5 % — AB (ref 39.0–52.0)
HEMATOCRIT: 31 % — AB (ref 39.0–52.0)
HEMOGLOBIN: 11.9 g/dL — AB (ref 13.0–17.0)
Hemoglobin: 9.8 g/dL — ABNORMAL LOW (ref 13.0–17.0)
MCH: 26.9 pg (ref 26.0–34.0)
MCH: 27.1 pg (ref 26.0–34.0)
MCHC: 30.9 g/dL (ref 30.0–36.0)
MCHC: 31.6 g/dL (ref 30.0–36.0)
MCV: 86.9 fL (ref 78.0–100.0)
MCV: 85.6 fL (ref 78.0–100.0)
Platelets: 334 10*3/uL (ref 150–400)
Platelets: 226 10*3/uL (ref 150–400)
RBC: 4.43 MIL/uL (ref 4.22–5.81)
RBC: 3.62 MIL/uL — ABNORMAL LOW (ref 4.22–5.81)
RDW: 15.7 % — ABNORMAL HIGH (ref 11.5–15.5)
RDW: 14.9 % (ref 11.5–15.5)
WBC: 10.3 10*3/uL (ref 4.0–10.5)
WBC: 14.9 10*3/uL — AB (ref 4.0–10.5)

## 2017-04-24 LAB — HEMOGLOBIN AND HEMATOCRIT, BLOOD
HEMATOCRIT: 26.3 % — AB (ref 39.0–52.0)
Hemoglobin: 8.3 g/dL — ABNORMAL LOW (ref 13.0–17.0)

## 2017-04-24 LAB — GLUCOSE, CAPILLARY
GLUCOSE-CAPILLARY: 104 mg/dL — AB (ref 65–99)
GLUCOSE-CAPILLARY: 96 mg/dL (ref 65–99)
GLUCOSE-CAPILLARY: 71 mg/dL (ref 65–99)
Glucose-Capillary: 108 mg/dL — ABNORMAL HIGH (ref 65–99)

## 2017-04-24 LAB — SURGICAL PCR SCREEN
MRSA, PCR: NEGATIVE
STAPHYLOCOCCUS AUREUS: POSITIVE — AB

## 2017-04-24 LAB — PREPARE RBC (CROSSMATCH)

## 2017-04-24 LAB — PLATELET COUNT: PLATELETS: 257 10*3/uL (ref 150–400)

## 2017-04-24 LAB — APTT: APTT: 40 s — AB (ref 24–36)

## 2017-04-24 SURGERY — CORONARY ARTERY BYPASS GRAFTING (CABG)
Anesthesia: General | Site: Chest

## 2017-04-24 MED ORDER — SODIUM CHLORIDE 0.9 % IV SOLN
INTRAVENOUS | Status: DC
  Filled 2017-04-24: qty 1

## 2017-04-24 MED ORDER — ASPIRIN 81 MG PO CHEW
324.0000 mg | CHEWABLE_TABLET | Freq: Every day | ORAL | Status: DC
  Filled 2017-04-24: qty 4

## 2017-04-24 MED ORDER — LACTATED RINGERS IV SOLN: 500.0000 mL | Freq: Once | INTRAVENOUS | Status: DC | PRN

## 2017-04-24 MED ORDER — MAGNESIUM SULFATE 4 GM/100ML IV SOLN
4.0000 g | Freq: Once | INTRAVENOUS | Status: AC
  Administered 2017-04-24: 4 g via INTRAVENOUS
  Filled 2017-04-24: qty 100

## 2017-04-24 MED ORDER — OXYCODONE HCL 5 MG PO TABS
5.0000 mg | ORAL_TABLET | ORAL | Status: DC | PRN
  Administered 2017-04-25 – 2017-04-26 (×4): 5 mg via ORAL
  Filled 2017-04-24 (×5): qty 1

## 2017-04-24 MED ORDER — PROTAMINE SULFATE 10 MG/ML IV SOLN
INTRAVENOUS | Status: AC
  Filled 2017-04-24: qty 5

## 2017-04-24 MED ORDER — DEXMEDETOMIDINE HCL IN NACL 200 MCG/50ML IV SOLN
INTRAVENOUS | Status: AC
  Filled 2017-04-24: qty 50

## 2017-04-24 MED ORDER — MIDAZOLAM HCL 10 MG/2ML IJ SOLN
INTRAMUSCULAR | Status: AC
  Filled 2017-04-24: qty 2

## 2017-04-24 MED ORDER — ACETAMINOPHEN 650 MG RE SUPP
650.0000 mg | Freq: Once | RECTAL | Status: AC
Start: 2017-04-24 — End: 2017-04-24
  Administered 2017-04-24: 650 mg via RECTAL

## 2017-04-24 MED ORDER — HEPARIN SODIUM (PORCINE) 1000 UNIT/ML IJ SOLN
INTRAMUSCULAR | Status: AC
  Filled 2017-04-24: qty 1

## 2017-04-24 MED ORDER — MUPIROCIN 2 % EX OINT
TOPICAL_OINTMENT | CUTANEOUS | Status: AC
  Administered 2017-04-24: 1 via NASAL
  Filled 2017-04-24: qty 22

## 2017-04-24 MED ORDER — LACTATED RINGERS IV SOLN
INTRAVENOUS | Status: DC
  Administered 2017-04-24 (×2): via INTRAVENOUS

## 2017-04-24 MED ORDER — SODIUM CHLORIDE 0.9% FLUSH
3.0000 mL | Freq: Two times a day (BID) | INTRAVENOUS | Status: DC
  Administered 2017-04-25 – 2017-04-28 (×7): 3 mL via INTRAVENOUS

## 2017-04-24 MED ORDER — FAMOTIDINE IN NACL 20-0.9 MG/50ML-% IV SOLN
20.0000 mg | Freq: Two times a day (BID) | INTRAVENOUS | Status: AC
  Administered 2017-04-24 – 2017-04-25 (×2): 20 mg via INTRAVENOUS
  Filled 2017-04-24: qty 50

## 2017-04-24 MED ORDER — PROPOFOL 10 MG/ML IV BOLUS
INTRAVENOUS | Status: DC | PRN
  Administered 2017-04-24: 100 mg via INTRAVENOUS

## 2017-04-24 MED ORDER — INSULIN ASPART 100 UNIT/ML ~~LOC~~ SOLN
0.0000 [IU] | SUBCUTANEOUS | Status: DC
  Administered 2017-04-25 (×2): 2 [IU] via SUBCUTANEOUS

## 2017-04-24 MED ORDER — ALBUMIN HUMAN 5 % IV SOLN
INTRAVENOUS | Status: DC | PRN
  Administered 2017-04-24: 18:00:00 via INTRAVENOUS

## 2017-04-24 MED ORDER — LACTATED RINGERS IV SOLN: INTRAVENOUS | Status: DC

## 2017-04-24 MED ORDER — PHENYLEPHRINE 40 MCG/ML (10ML) SYRINGE FOR IV PUSH (FOR BLOOD PRESSURE SUPPORT)
PREFILLED_SYRINGE | INTRAVENOUS | Status: AC
  Filled 2017-04-24: qty 10

## 2017-04-24 MED ORDER — BISACODYL 5 MG PO TBEC
10.0000 mg | DELAYED_RELEASE_TABLET | Freq: Every day | ORAL | Status: DC
  Administered 2017-04-25 – 2017-04-27 (×3): 10 mg via ORAL
  Filled 2017-04-24 (×4): qty 2

## 2017-04-24 MED ORDER — THROMBIN (RECOMBINANT) 5000 UNITS EX SOLR
CUTANEOUS | Status: AC
  Filled 2017-04-24: qty 5000

## 2017-04-24 MED ORDER — MUPIROCIN 2 % EX OINT
1.0000 "application " | TOPICAL_OINTMENT | Freq: Two times a day (BID) | CUTANEOUS | Status: DC
  Administered 2017-04-24: 1 via NASAL

## 2017-04-24 MED ORDER — VANCOMYCIN HCL IN DEXTROSE 1-5 GM/200ML-% IV SOLN
1000.0000 mg | Freq: Once | INTRAVENOUS | Status: AC
  Administered 2017-04-25: 1000 mg via INTRAVENOUS
  Filled 2017-04-24: qty 200

## 2017-04-24 MED ORDER — FENTANYL CITRATE (PF) 250 MCG/5ML IJ SOLN
INTRAMUSCULAR | Status: AC
  Filled 2017-04-24: qty 20

## 2017-04-24 MED ORDER — ARTIFICIAL TEARS OPHTHALMIC OINT
TOPICAL_OINTMENT | OPHTHALMIC | Status: DC | PRN
  Administered 2017-04-24: 1 via OPHTHALMIC

## 2017-04-24 MED ORDER — SIMVASTATIN 20 MG PO TABS
20.0000 mg | ORAL_TABLET | Freq: Every day | ORAL | Status: DC
  Administered 2017-04-25: 20 mg via ORAL
  Filled 2017-04-24: qty 1

## 2017-04-24 MED ORDER — ONDANSETRON HCL 4 MG/2ML IJ SOLN
4.0000 mg | Freq: Four times a day (QID) | INTRAMUSCULAR | Status: DC | PRN
  Administered 2017-04-25: 4 mg via INTRAVENOUS
  Filled 2017-04-24: qty 2

## 2017-04-24 MED ORDER — ROCURONIUM BROMIDE 10 MG/ML (PF) SYRINGE
PREFILLED_SYRINGE | INTRAVENOUS | Status: AC
  Filled 2017-04-24: qty 5

## 2017-04-24 MED ORDER — ROCURONIUM BROMIDE 10 MG/ML (PF) SYRINGE
PREFILLED_SYRINGE | INTRAVENOUS | Status: DC | PRN
  Administered 2017-04-24 (×4): 50 mg via INTRAVENOUS

## 2017-04-24 MED ORDER — PROPOFOL 10 MG/ML IV BOLUS
INTRAVENOUS | Status: AC
  Filled 2017-04-24: qty 20

## 2017-04-24 MED ORDER — MORPHINE SULFATE (PF) 4 MG/ML IV SOLN: 1.0000 mg | INTRAVENOUS | Status: AC | PRN

## 2017-04-24 MED ORDER — METOPROLOL TARTRATE 12.5 MG HALF TABLET: 12.5000 mg | ORAL_TABLET | Freq: Two times a day (BID) | ORAL | Status: DC

## 2017-04-24 MED ORDER — DEXTROSE 5 % IV SOLN
1.5000 g | Freq: Two times a day (BID) | INTRAVENOUS | Status: AC
  Administered 2017-04-24 – 2017-04-26 (×4): 1.5 g via INTRAVENOUS
  Filled 2017-04-24 (×4): qty 1.5

## 2017-04-24 MED ORDER — PANTOPRAZOLE SODIUM 40 MG PO TBEC
40.0000 mg | DELAYED_RELEASE_TABLET | Freq: Every day | ORAL | Status: DC
  Administered 2017-04-26 – 2017-04-28 (×3): 40 mg via ORAL
  Filled 2017-04-24 (×3): qty 1

## 2017-04-24 MED ORDER — DEXMEDETOMIDINE HCL IN NACL 400 MCG/100ML IV SOLN: 0.0000 ug/kg/h | INTRAVENOUS | Status: DC

## 2017-04-24 MED ORDER — NITROGLYCERIN IN D5W 200-5 MCG/ML-% IV SOLN: 0.0000 ug/min | INTRAVENOUS | Status: DC

## 2017-04-24 MED ORDER — SODIUM CHLORIDE 0.9 % IV SOLN: Freq: Once | INTRAVENOUS | Status: DC

## 2017-04-24 MED ORDER — SODIUM CHLORIDE 0.9 % IV SOLN
0.0000 ug/min | INTRAVENOUS | Status: DC
  Administered 2017-04-25: 25 ug/min via INTRAVENOUS
  Filled 2017-04-24: qty 2

## 2017-04-24 MED ORDER — THROMBIN (RECOMBINANT) 5000 UNITS EX SOLR
CUTANEOUS | Status: AC
  Filled 2017-04-24: qty 10000

## 2017-04-24 MED ORDER — ACETAMINOPHEN 500 MG PO TABS
1000.0000 mg | ORAL_TABLET | Freq: Four times a day (QID) | ORAL | Status: DC
  Administered 2017-04-25 – 2017-04-28 (×11): 1000 mg via ORAL
  Filled 2017-04-24 (×11): qty 2

## 2017-04-24 MED ORDER — BISACODYL 10 MG RE SUPP
10.0000 mg | Freq: Every day | RECTAL | Status: DC
  Filled 2017-04-24: qty 1

## 2017-04-24 MED ORDER — SODIUM CHLORIDE 0.9% FLUSH: 3.0000 mL | INTRAVENOUS | Status: DC | PRN

## 2017-04-24 MED ORDER — PROTAMINE SULFATE 10 MG/ML IV SOLN
INTRAVENOUS | Status: DC | PRN
  Administered 2017-04-24: 70 mg via INTRAVENOUS
  Administered 2017-04-24 (×3): 50 mg via INTRAVENOUS
  Administered 2017-04-24: 30 mg via INTRAVENOUS
  Administered 2017-04-24: 50 mg via INTRAVENOUS

## 2017-04-24 MED ORDER — LACTATED RINGERS IV SOLN
INTRAVENOUS | Status: DC
  Administered 2017-04-25: 09:00:00 via INTRAVENOUS

## 2017-04-24 MED ORDER — FENTANYL CITRATE (PF) 250 MCG/5ML IJ SOLN
INTRAMUSCULAR | Status: AC
  Filled 2017-04-24: qty 5

## 2017-04-24 MED ORDER — PHENYLEPHRINE HCL 10 MG/ML IJ SOLN
INTRAVENOUS | Status: DC | PRN
  Administered 2017-04-24: 20 ug/min via INTRAVENOUS

## 2017-04-24 MED ORDER — MORPHINE SULFATE (PF) 4 MG/ML IV SOLN
2.0000 mg | INTRAVENOUS | Status: DC | PRN
  Administered 2017-04-25: 2 mg via INTRAVENOUS
  Filled 2017-04-24: qty 1

## 2017-04-24 MED ORDER — SODIUM CHLORIDE 0.9 % IV SOLN: INTRAVENOUS | Status: DC

## 2017-04-24 MED ORDER — SODIUM CHLORIDE 0.9 % IV SOLN: 250.0000 mL | INTRAVENOUS | Status: DC

## 2017-04-24 MED ORDER — SODIUM CHLORIDE 0.9 % IV SOLN
INTRAVENOUS | Status: DC | PRN
  Administered 2017-04-24 (×2): via INTRAVENOUS

## 2017-04-24 MED ORDER — HEMOSTATIC AGENTS (NO CHARGE) OPTIME
TOPICAL | Status: DC | PRN
  Administered 2017-04-24: 1 via TOPICAL

## 2017-04-24 MED ORDER — METOPROLOL TARTRATE 5 MG/5ML IV SOLN: 2.5000 mg | INTRAVENOUS | Status: DC | PRN

## 2017-04-24 MED ORDER — THROMBIN (RECOMBINANT) 20000 UNITS EX SOLR
CUTANEOUS | Status: DC | PRN
  Administered 2017-04-24: 20000 [IU] via TOPICAL

## 2017-04-24 MED ORDER — ONDANSETRON HCL 4 MG/2ML IJ SOLN
INTRAMUSCULAR | Status: AC
  Filled 2017-04-24: qty 2

## 2017-04-24 MED ORDER — SODIUM CHLORIDE 0.45 % IV SOLN
INTRAVENOUS | Status: DC | PRN
  Administered 2017-04-24: 20:00:00 via INTRAVENOUS

## 2017-04-24 MED ORDER — ASPIRIN EC 325 MG PO TBEC
325.0000 mg | DELAYED_RELEASE_TABLET | Freq: Every day | ORAL | Status: DC
  Administered 2017-04-25 – 2017-04-28 (×4): 325 mg via ORAL
  Filled 2017-04-24 (×4): qty 1

## 2017-04-24 MED ORDER — FENTANYL CITRATE (PF) 250 MCG/5ML IJ SOLN
INTRAMUSCULAR | Status: DC | PRN
  Administered 2017-04-24: 100 ug via INTRAVENOUS
  Administered 2017-04-24: 250 ug via INTRAVENOUS
  Administered 2017-04-24: 50 ug via INTRAVENOUS
  Administered 2017-04-24: 150 ug via INTRAVENOUS
  Administered 2017-04-24: 100 ug via INTRAVENOUS
  Administered 2017-04-24: 50 ug via INTRAVENOUS
  Administered 2017-04-24 (×2): 100 ug via INTRAVENOUS
  Administered 2017-04-24: 200 ug via INTRAVENOUS
  Administered 2017-04-24: 150 ug via INTRAVENOUS

## 2017-04-24 MED ORDER — PROTAMINE SULFATE 10 MG/ML IV SOLN
INTRAVENOUS | Status: AC
  Filled 2017-04-24: qty 25

## 2017-04-24 MED ORDER — ACETAMINOPHEN 160 MG/5ML PO SOLN
1000.0000 mg | Freq: Four times a day (QID) | ORAL | Status: DC
  Administered 2017-04-24: 1000 mg
  Filled 2017-04-24: qty 40.6

## 2017-04-24 MED ORDER — CHLORHEXIDINE GLUCONATE 0.12 % MT SOLN
15.0000 mL | OROMUCOSAL | Status: AC
  Administered 2017-04-24: 15 mL via OROMUCOSAL

## 2017-04-24 MED ORDER — MIDAZOLAM HCL 5 MG/5ML IJ SOLN
INTRAMUSCULAR | Status: DC | PRN
  Administered 2017-04-24: 3 mg via INTRAVENOUS
  Administered 2017-04-24: 2 mg via INTRAVENOUS
  Administered 2017-04-24: 4 mg via INTRAVENOUS
  Administered 2017-04-24: 3 mg via INTRAVENOUS

## 2017-04-24 MED ORDER — MIDAZOLAM HCL 2 MG/2ML IJ SOLN: 2.0000 mg | INTRAMUSCULAR | Status: DC | PRN

## 2017-04-24 MED ORDER — LACTATED RINGERS IV SOLN
INTRAVENOUS | Status: DC | PRN
  Administered 2017-04-24: 13:00:00 via INTRAVENOUS

## 2017-04-24 MED ORDER — TRAMADOL HCL 50 MG PO TABS: 50.0000 mg | ORAL_TABLET | Freq: Four times a day (QID) | ORAL | Status: DC

## 2017-04-24 MED ORDER — DOCUSATE SODIUM 100 MG PO CAPS
200.0000 mg | ORAL_CAPSULE | Freq: Every day | ORAL | Status: DC
  Administered 2017-04-25 – 2017-04-27 (×3): 200 mg via ORAL
  Filled 2017-04-24 (×4): qty 2

## 2017-04-24 MED ORDER — MIDAZOLAM HCL 2 MG/2ML IJ SOLN
INTRAMUSCULAR | Status: AC
  Filled 2017-04-24: qty 2

## 2017-04-24 MED ORDER — ACETAMINOPHEN 160 MG/5ML PO SOLN: 650.0000 mg | Freq: Once | ORAL | Status: AC

## 2017-04-24 MED ORDER — HEPARIN SODIUM (PORCINE) 1000 UNIT/ML IJ SOLN
INTRAMUSCULAR | Status: DC | PRN
  Administered 2017-04-24: 29000 [IU] via INTRAVENOUS

## 2017-04-24 MED ORDER — THROMBIN (RECOMBINANT) 20000 UNITS EX SOLR
OROMUCOSAL | Status: DC | PRN
  Administered 2017-04-24: 4 mL via TOPICAL

## 2017-04-24 MED ORDER — METOPROLOL TARTRATE 25 MG/10 ML ORAL SUSPENSION: 12.5000 mg | Freq: Two times a day (BID) | ORAL | Status: DC

## 2017-04-24 MED ORDER — POTASSIUM CHLORIDE 10 MEQ/50ML IV SOLN: 10.0000 meq | INTRAVENOUS | Status: AC

## 2017-04-24 MED ORDER — ALBUMIN HUMAN 5 % IV SOLN
250.0000 mL | INTRAVENOUS | Status: AC | PRN
  Administered 2017-04-24 – 2017-04-25 (×4): 250 mL via INTRAVENOUS
  Filled 2017-04-24 (×3): qty 250

## 2017-04-24 MED ORDER — INSULIN REGULAR BOLUS VIA INFUSION
0.0000 [IU] | Freq: Three times a day (TID) | INTRAVENOUS | Status: DC
  Filled 2017-04-24: qty 10

## 2017-04-24 SURGICAL SUPPLY — 111 items
BAG DECANTER FOR FLEXI CONT (MISCELLANEOUS) ×3 IMPLANT
BANDAGE ACE 4X5 VEL STRL LF (GAUZE/BANDAGES/DRESSINGS) ×6 IMPLANT
BANDAGE ACE 6X5 VEL STRL LF (GAUZE/BANDAGES/DRESSINGS) ×3 IMPLANT
BANDAGE ELASTIC 4 VELCRO ST LF (GAUZE/BANDAGES/DRESSINGS) ×3 IMPLANT
BANDAGE ELASTIC 6 VELCRO ST LF (GAUZE/BANDAGES/DRESSINGS) ×6 IMPLANT
BASKET HEART (ORDER IN 25'S) (MISCELLANEOUS) ×1
BASKET HEART (ORDER IN 25S) (MISCELLANEOUS) ×2 IMPLANT
BLADE NEEDLE 3 SS STRL (BLADE) ×3 IMPLANT
BLADE STERNUM SYSTEM 6 (BLADE) ×3 IMPLANT
BNDG GAUZE ELAST 4 BULKY (GAUZE/BANDAGES/DRESSINGS) ×6 IMPLANT
CABLE PACING FASLOC BLUE (MISCELLANEOUS) ×3 IMPLANT
CANISTER SUCT 3000ML PPV (MISCELLANEOUS) ×3 IMPLANT
CATH ROBINSON RED A/P 18FR (CATHETERS) ×6 IMPLANT
CATH THORACIC 28FR (CATHETERS) ×3 IMPLANT
CATH THORACIC 36FR (CATHETERS) ×3 IMPLANT
CATH THORACIC 36FR RT ANG (CATHETERS) ×3 IMPLANT
CLIP FOGARTY SPRING 6M (CLIP) ×3 IMPLANT
CLIP VESOCCLUDE MED 24/CT (CLIP) ×6 IMPLANT
CLIP VESOCCLUDE SM WIDE 24/CT (CLIP) ×12 IMPLANT
COVER SURGICAL LIGHT HANDLE (MISCELLANEOUS) ×3 IMPLANT
CRADLE DONUT ADULT HEAD (MISCELLANEOUS) ×3 IMPLANT
DRAPE CARDIOVASCULAR INCISE (DRAPES) ×1
DRAPE SLUSH/WARMER DISC (DRAPES) ×3 IMPLANT
DRAPE SRG 135X102X78XABS (DRAPES) ×2 IMPLANT
DRSG COVADERM 4X14 (GAUZE/BANDAGES/DRESSINGS) ×3 IMPLANT
ELECT CAUTERY BLADE 6.4 (BLADE) ×3 IMPLANT
ELECT REM PT RETURN 9FT ADLT (ELECTROSURGICAL) ×6
ELECTRODE REM PT RTRN 9FT ADLT (ELECTROSURGICAL) ×4 IMPLANT
FELT TEFLON 1X6 (MISCELLANEOUS) ×3 IMPLANT
GAUZE SPONGE 4X4 12PLY STRL (GAUZE/BANDAGES/DRESSINGS) ×6 IMPLANT
GAUZE SPONGE 4X4 12PLY STRL LF (GAUZE/BANDAGES/DRESSINGS) ×9 IMPLANT
GLOVE BIO SURGEON STRL SZ 6 (GLOVE) IMPLANT
GLOVE BIO SURGEON STRL SZ 6.5 (GLOVE) ×18 IMPLANT
GLOVE BIO SURGEON STRL SZ7 (GLOVE) IMPLANT
GLOVE BIO SURGEON STRL SZ7.5 (GLOVE) IMPLANT
GLOVE BIOGEL PI IND STRL 6 (GLOVE) IMPLANT
GLOVE BIOGEL PI IND STRL 6.5 (GLOVE) ×16 IMPLANT
GLOVE BIOGEL PI IND STRL 7.0 (GLOVE) IMPLANT
GLOVE BIOGEL PI INDICATOR 6 (GLOVE)
GLOVE BIOGEL PI INDICATOR 6.5 (GLOVE) ×8
GLOVE BIOGEL PI INDICATOR 7.0 (GLOVE)
GLOVE EUDERMIC 7 POWDERFREE (GLOVE) ×6 IMPLANT
GLOVE ORTHO TXT STRL SZ7.5 (GLOVE) IMPLANT
GOWN STRL REUS W/ TWL LRG LVL3 (GOWN DISPOSABLE) ×16 IMPLANT
GOWN STRL REUS W/ TWL XL LVL3 (GOWN DISPOSABLE) ×2 IMPLANT
GOWN STRL REUS W/TWL LRG LVL3 (GOWN DISPOSABLE) ×8
GOWN STRL REUS W/TWL XL LVL3 (GOWN DISPOSABLE) ×1
HEMOSTAT POWDER SURGIFOAM 1G (HEMOSTASIS) ×9 IMPLANT
HEMOSTAT SURGICEL 2X14 (HEMOSTASIS) ×3 IMPLANT
INSERT FOGARTY 61MM (MISCELLANEOUS) IMPLANT
INSERT FOGARTY XLG (MISCELLANEOUS) IMPLANT
KIT BASIN OR (CUSTOM PROCEDURE TRAY) ×3 IMPLANT
KIT CATH CPB BARTLE (MISCELLANEOUS) ×3 IMPLANT
KIT ROOM TURNOVER OR (KITS) ×3 IMPLANT
KIT SUCTION CATH 14FR (SUCTIONS) ×3 IMPLANT
KIT VASOVIEW HEMOPRO VH 3000 (KITS) ×3 IMPLANT
NS IRRIG 1000ML POUR BTL (IV SOLUTION) ×18 IMPLANT
PACK OPEN HEART (CUSTOM PROCEDURE TRAY) ×3 IMPLANT
PAD ARMBOARD 7.5X6 YLW CONV (MISCELLANEOUS) ×6 IMPLANT
PAD ELECT DEFIB RADIOL ZOLL (MISCELLANEOUS) ×3 IMPLANT
PENCIL BUTTON HOLSTER BLD 10FT (ELECTRODE) ×3 IMPLANT
PUNCH AORTIC ROTATE 4.0MM (MISCELLANEOUS) IMPLANT
PUNCH AORTIC ROTATE 4.5MM 8IN (MISCELLANEOUS) ×3 IMPLANT
PUNCH AORTIC ROTATE 5MM 8IN (MISCELLANEOUS) IMPLANT
SET CARDIOPLEGIA MPS 5001102 (MISCELLANEOUS) ×3 IMPLANT
SPONGE INTESTINAL PEANUT (DISPOSABLE) IMPLANT
SPONGE LAP 18X18 X RAY DECT (DISPOSABLE) ×3 IMPLANT
SPONGE LAP 4X18 X RAY DECT (DISPOSABLE) ×3 IMPLANT
SUT BONE WAX W31G (SUTURE) ×3 IMPLANT
SUT MNCRL AB 4-0 PS2 18 (SUTURE) ×6 IMPLANT
SUT PROLENE 3 0 SH DA (SUTURE) IMPLANT
SUT PROLENE 3 0 SH1 36 (SUTURE) ×3 IMPLANT
SUT PROLENE 4 0 RB 1 (SUTURE) ×1
SUT PROLENE 4 0 SH DA (SUTURE) IMPLANT
SUT PROLENE 4-0 RB1 .5 CRCL 36 (SUTURE) ×2 IMPLANT
SUT PROLENE 5 0 C 1 36 (SUTURE) ×6 IMPLANT
SUT PROLENE 6 0 C 1 30 (SUTURE) ×12 IMPLANT
SUT PROLENE 7 0 BV 1 (SUTURE) IMPLANT
SUT PROLENE 7 0 BV1 MDA (SUTURE) ×9 IMPLANT
SUT PROLENE 8 0 BV175 6 (SUTURE) IMPLANT
SUT SILK  1 MH (SUTURE) ×3
SUT SILK 1 MH (SUTURE) ×6 IMPLANT
SUT SILK 1 TIES 10X30 (SUTURE) ×3 IMPLANT
SUT SILK 2 0 SH CR/8 (SUTURE) ×6 IMPLANT
SUT SILK 2 0 TIES 10X30 (SUTURE) ×6 IMPLANT
SUT SILK 3 0 SH CR/8 (SUTURE) ×3 IMPLANT
SUT SILK 4 0 TIE 10X30 (SUTURE) ×6 IMPLANT
SUT STEEL STERNAL CCS#1 18IN (SUTURE) IMPLANT
SUT STEEL SZ 6 DBL 3X14 BALL (SUTURE) ×9 IMPLANT
SUT TEM PAC WIRE 2 0 SH (SUTURE) ×3 IMPLANT
SUT VIC AB 1 CTX 36 (SUTURE) ×2
SUT VIC AB 1 CTX36XBRD ANBCTR (SUTURE) ×4 IMPLANT
SUT VIC AB 2-0 CT1 27 (SUTURE) ×2
SUT VIC AB 2-0 CT1 TAPERPNT 27 (SUTURE) ×4 IMPLANT
SUT VIC AB 2-0 CTX 27 (SUTURE) ×6 IMPLANT
SUT VIC AB 3-0 SH 27 (SUTURE)
SUT VIC AB 3-0 SH 27X BRD (SUTURE) IMPLANT
SUT VIC AB 3-0 X1 27 (SUTURE) ×9 IMPLANT
SUT VICRYL 4-0 PS2 18IN ABS (SUTURE) IMPLANT
SUTURE E-PAK OPEN HEART (SUTURE) IMPLANT
SYSTEM SAHARA CHEST DRAIN ATS (WOUND CARE) ×3 IMPLANT
TAPE CLOTH SURG 4X10 WHT LF (GAUZE/BANDAGES/DRESSINGS) ×3 IMPLANT
TAPE PAPER 2X10 WHT MICROPORE (GAUZE/BANDAGES/DRESSINGS) ×3 IMPLANT
TOWEL GREEN STERILE (TOWEL DISPOSABLE) IMPLANT
TOWEL GREEN STERILE FF (TOWEL DISPOSABLE) ×3 IMPLANT
TOWEL OR 17X24 6PK STRL BLUE (TOWEL DISPOSABLE) ×3 IMPLANT
TOWEL OR 17X26 10 PK STRL BLUE (TOWEL DISPOSABLE) ×3 IMPLANT
TRAY FOLEY SILVER 16FR TEMP (SET/KITS/TRAYS/PACK) ×3 IMPLANT
TUBING INSUFFLATION (TUBING) ×3 IMPLANT
UNDERPAD 30X30 (UNDERPADS AND DIAPERS) ×3 IMPLANT
WATER STERILE IRR 1000ML POUR (IV SOLUTION) ×6 IMPLANT

## 2017-04-24 NOTE — Anesthesia Procedure Notes (Signed)
Central Venous Catheter Insertion Performed by: Kipp Brood, anesthesiologist Start/End10/29/2018 12:40 PM, 04/24/2017 12:50 PM Patient location: Pre-op. Preanesthetic checklist: patient identified, IV checked, site marked, risks and benefits discussed, surgical consent, monitors and equipment checked, pre-op evaluation, timeout performed and anesthesia consent Lidocaine 1% used for infiltration and patient sedated Hand hygiene performed  and maximum sterile barriers used  Catheter size: 9 Fr Sheath introducer Procedure performed using ultrasound guided technique. Ultrasound Notes:anatomy identified, needle tip was noted to be adjacent to the nerve/plexus identified, no ultrasound evidence of intravascular and/or intraneural injection and image(s) printed for medical record Attempts: 1 Following insertion, line sutured and dressing applied. Post procedure assessment: blood return through all ports, free fluid flow and no air  Patient tolerated the procedure well with no immediate complications.

## 2017-04-24 NOTE — Anesthesia Procedure Notes (Signed)
Procedure Name: Intubation Date/Time: 04/24/2017 1:50 PM Performed by: Rejeana Brock L Pre-anesthesia Checklist: Patient identified, Emergency Drugs available, Suction available and Patient being monitored Patient Re-evaluated:Patient Re-evaluated prior to induction Oxygen Delivery Method: Circle System Utilized Preoxygenation: Pre-oxygenation with 100% oxygen Induction Type: IV induction Ventilation: Mask ventilation without difficulty Laryngoscope Size: Mac and 4 Grade View: Grade I Tube type: Oral Tube size: 8.0 mm Number of attempts: 1 Airway Equipment and Method: Stylet and Oral airway Placement Confirmation: ETT inserted through vocal cords under direct vision,  positive ETCO2 and breath sounds checked- equal and bilateral Secured at: 22 cm Tube secured with: Tape Dental Injury: Teeth and Oropharynx as per pre-operative assessment

## 2017-04-24 NOTE — Transfer of Care (Signed)
Immediate Anesthesia Transfer of Care Note  Patient: Kevin Reeves  Procedure(s) Performed: CORONARY ARTERY BYPASS GRAFTING (CABG)x3 using left internal mammary artery and left greater saphenous vein, bilateral saphenous vein harvested endoscopically (N/A Chest) TRANSESOPHAGEAL ECHOCARDIOGRAM (TEE) (N/A )  Patient Location: SICU  Anesthesia Type:General  Level of Consciousness: Patient remains intubated per anesthesia plan  Airway & Oxygen Therapy: Patient remains intubated per anesthesia plan and Patient placed on Ventilator (see vital sign flow sheet for setting)  Post-op Assessment: Report given to RN and Post -op Vital signs reviewed and stable  Post vital signs: Reviewed and stable  Last Vitals:  Vitals:   04/24/17 0558 04/24/17 0819  BP:    Pulse: (!) 58 (!) 102  Resp:    Temp:    SpO2:      Last Pain:  Vitals:   04/24/17 0549  TempSrc: Oral  PainSc:       Patients Stated Pain Goal: 0 (04/23/17 0825)  Complications: No apparent anesthesia complications

## 2017-04-24 NOTE — Anesthesia Procedure Notes (Signed)
Central Venous Catheter Insertion Performed by: Kipp Brood, anesthesiologist Start/End10/29/2018 12:40 PM, 04/24/2017 12:50 PM Patient location: Pre-op. Preanesthetic checklist: patient identified, IV checked, site marked, risks and benefits discussed, surgical consent, monitors and equipment checked, pre-op evaluation, timeout performed and anesthesia consent Position: supine Hand hygiene performed  and maximum sterile barriers used  Catheter size: 9 Fr PA cath was placed.Swan type:thermodilution Procedure performed without using ultrasound guided technique. Ultrasound Notes:anatomy identified, needle tip was noted to be adjacent to the nerve/plexus identified, no ultrasound evidence of intravascular and/or intraneural injection and image(s) printed for medical record Attempts: 1 Following insertion, line sutured, dressing applied and Biopatch. Post procedure assessment: blood return through all ports, free fluid flow and no air  Patient tolerated the procedure well with no immediate complications.

## 2017-04-24 NOTE — Progress Notes (Signed)
[id number] Gave pt's wife [facility] care guide. Already had [facility] booklet. Discussed the importance of IS and walking to improve pulmonary status. Discussed sternal precautions. Offered to put on pre op video but family declined as stated too close to surgery. Wife will be available after surgery discharge to be with pt 24/7. Will follow up after surgery. Luetta Nutting RN BSN 04/24/2017 12:08 PM

## 2017-04-24 NOTE — Op Note (Signed)
CARDIOVASCULAR SURGERY OPERATIVE NOTE  04/24/2017  Surgeon:  Alleen Borne, MD  First Assistant: Doree Fudge,  PA-C   Preoperative Diagnosis:  Severe multi-vessel coronary artery disease   Postoperative Diagnosis:  Same   Procedure:  1. Median Sternotomy 2. Extracorporeal circulation 3.   Coronary artery bypass grafting x 3   Left internal mammary graft to the LAD  SVG to Ramus  SVG to RCA  4.   Endoscopic vein harvest from both legs   Anesthesia:  General Endotracheal   Clinical History/Surgical Indication:  The patient is a 76 year old gentleman with hypertension, hyperlipidemia, carotid artery disease s/p right CEA in 2008, and CAD s/p stent to RI in 2004 followed by posterior MI in 09/2005 after plavix stopped due to in stent thrombosis. This was opened and he has been maintained on Plavix since. He now presents with recent exertional shortness of breath which was his prior symptom before his stent. He has the SOB with walking any distance, doing dishes, taking trash out. He denies any chest pain or pressure, shortness of breath, or peripheral edema. He had an echo on 10/19 which showed normal LV systolic function, grade 2 diastolic dysfunction. The aortic valve was moderately calcified but leaflets appear to move fairly well and no gradient measured, trivial AI. Cardiac cath yesterday shows severe 3-vessel CAD. There is 90% proximal LAD, 80% mid LAD and 70% mid LAD stenoses. The large ramus  Branch has 95% stenosis beyond the previous stent which is patent. The LCX is non-dominant and small with two small marginal branches that have high grade stenoses. The RCA is dominant with 80% mid-LAD stenosis and 60% distal stenosis. LVEF is 50-55%.   Preparation:  The patient was seen in the preoperative holding area and the correct patient, correct operation were confirmed with the  patient after reviewing the medical record and catheterization. The consent was signed by me. Preoperative antibiotics were given. A pulmonary arterial line and radial arterial line were placed by the anesthesia team. The patient was taken back to the operating room and positioned supine on the operating room table. After being placed under general endotracheal anesthesia by the anesthesia team a foley catheter was placed. The neck, chest, abdomen, and both legs were prepped with betadine soap and solution and draped in the usual sterile manner. A surgical time-out was taken and the correct patient and operative procedure were confirmed with the nursing and anesthesia staff.  TEE: performed by Dr. Kipp Brood  Conclusions   Result status: Final result   Aortic valve: The valve is trileaflet. Moderate valve thickening present. Moderate valve calcification present. No stenosis. No regurgitation. No AV vegetation.  Mitral valve: Non-specific thickening. Mild leaflet thickening is present. Mild regurgitation.  Right ventricle: Normal wall thickness and ejection fraction. Cavity is mildly dilated. No thrombus present. No mass present.  Tricuspid valve: Trace regurgitation. The tricuspid valve regurgitation jet is central.  Pulmonic valve: Trace regurgitation.  Septum: No Patent Foramen Ovale present.  Left atrium: Patent foramen ovale not present.  Left ventricle: Normal cavity size. LV systolic function is normal with an EF of 55-60%. There are no obvious wall motion abnormalities. No thrombus present. No mass present.  Left atrium: No spontaneous echo contrast.    MERGE Images   Show images for ECHO TEE  Patient Information   Patient Name Coolidge, Coffing Sex Male DOB 05/30/1941 SSN [id number]  Reason For Exam  Priority: Routine  Comments: To be done by anesthesia MD  intra-operatively  Surgical History   Surgical History   Procedure Laterality Date Comment Source  CARDIAC  CATHETERIZATION   cardiac stent placed Provider  CAROTID ENDARTERECTOMY  2008 Right Provider    Other Surgical History   Procedure Laterality Date Comment Source  CHOLECYSTECTOMY N/A 03/28/2014 Procedure: LAPAROSCOPIC CHOLECYSTECTOMY; Surgeon: Dalia Heading, MD; Location: [facility]; Service: General; Laterality: N/A; Provider  COLONOSCOPY  2008 adenoma resected in 1999; no intervention in 2008 Provider  COLONOSCOPY WITH ESOPHAGOGASTRODUODENOSCOPY (EGD)  Feb 2010 Dr. Jena Gauss: mild erosive reflux esophagitis, non-critical Schatzki's ring without dilation, moderate sized hiatal hernia, bulbar erosions. Negative H.pylori serology. Colonoscopy with normal rectum, pancolonic diverticula, needs surveillance every 5 years due to history of adenomas.  Provider  LEFT HEART CATH AND CORONARY ANGIOGRAPHY N/A 04/19/2017 Procedure: LEFT HEART CATH AND CORONARY ANGIOGRAPHY; Surgeon: Kathleene Hazel, MD; Location: [facility]; Service: Cardiovascular; Laterality: N/A; Provider    Echo Findings   Left Ventricle Normal cavity size. LV systolic function is normal with an EF of 55-60%. There are no obvious wall motion abnormalities. No thrombus present. No mass present.    Septum Normal atrial and ventricular septum with no septal defect and normal septal motion. No Patent Foramen Ovale present.    Left Atrium Normal cavity size. There was no thrombus within the LA appendage or LA cavity. Pulmonary veins are normal size.    Aortic Valve The valve is trileaflet. The aortic valve leaflets were moderately thickened and moderately calcified. There was mildly reduced leaflet excursion. The mean gradient was 5 mm of hg. The aortic valve area by the VTI method was 1.68 cm2. The ratio of the LVOT VTI /Aortic valve VTI was 0.36. Moderate valve thickening present. Moderate valve calcification present. No stenosis. No regurgitation. No AV vegetation.    Aorta Dilation comments: There was a well defined  aortic root and sino-tubular junction without dilatation or effacement. There was non-specific thickening of the aortic wall without protruding atheromatous disease. No aneurysm present.    Mitral Valve Non-specific thickening. Mild leaflet thickening is present. Mild regurgitation. The jet direction is centrally-directed. No prolapse present. No vegetation present. No abscess present.    Right Ventricle Normal wall thickness and ejection fraction. Cavity is mildly dilated. No thrombus present. No mass present.    Right Atrium Normal right atrial size.    Tricuspid Valve Normal tricuspid valve structure. No stenosis. Trace regurgitation. The tricuspid valve regurgitation jet is central.    Pulmonic Valve Normal pulmonic valve structure. No stenosis. Trace regurgitation.    Pericardium Normal pericardium with no pericardial effusion.    Post-Op TEE AORTA Aorta unchanged from pre-bypass.  LEFT VENTRICLE Left ventricle unchanged from pre-bypass.  RIGHT VENTRICLE Right ventricle unchanged from pre-bypass.  AORTIC VALVE Aortic valve unchanged from pre-bypass MITRAL VALVE Mitral valve unchanged from pre-bypass.  TRICUSPID VALVE Tricuspid valve unchanged from pre-bypass.    Performing Technologist/Nurse   Performing Technologist/Nurse: Pieter Partridge        Aortic Valve Measurements   Stenosis  LVOT diameter 2.4 mm    VTI 37.3 cm    Valve area 1.62 cm2    Mean grad 5 mmHg    Peak grad 9 mmHg     Regurgitation  VTI 37.3 cm          Aortic Root Measurements - End Diastolic   Annulus 2.4 cm    Sinus 3.2 cm    STJ 2.9 cm    Ao-prox 3  Cardiopulmonary Bypass:  A median sternotomy was performed. The pericardium was opened in the midline. Right ventricular function appeared normal. The ascending aorta was of normal size and had no palpable plaque.   There were no contraindications to aortic cannulation or cross-clamping. The patient was fully  systemically heparinized and the ACT was maintained > 400 sec. The proximal aortic arch was cannulated with a 20 F aortic cannula for arterial inflow. Venous cannulation was performed via the right atrial appendage using a two-staged venous cannula. An antegrade cardioplegia/vent cannula was inserted into the mid-ascending aorta. Aortic occlusion was performed with a single cross-clamp. Systemic cooling to 32 degrees Centigrade and topical cooling of the heart with iced saline were used. Hyperkalemic antegrade cold blood cardioplegia was used to induce diastolic arrest and was then given at about 20 minute intervals throughout the period of arrest to maintain myocardial temperature at or below 10 degrees centigrade. A temperature probe was inserted into the interventricular septum and an insulating pad was placed in the pericardium.   Left internal mammary harvest:  The left side of the sternum was retracted using the Rultract retractor. The left internal mammary artery was harvested as a pedicle graft. All side branches were clipped. It was a medium-sized vessel of good quality with excellent blood flow. It was ligated distally and divided. It was sprayed with topical papaverine solution to prevent vasospasm.   Endoscopic vein harvest:  The right greater saphenous vein was harvested endoscopically through a 2 cm incision medial to the right knee. It was harvested from the upper thigh to below the knee. It was a medium-sized vein of good quality above the knee but below it was small and fragile due to branching. The side branches were all ligated with 4-0 silk ties. A second segment of greater saphenous vein was harvested from the left thigh. It was a medium caliber vein of good quality.  Coronary arteries:  The coronary arteries were examined.   LAD:  Large vessel with severe proximal and mid vessel disease but no distal disease.   LCX:  The Ramus was large and intramyocardial but exited to the  surface distally where is was a large graft-able vessel with no distal disease. The OM1 and OM2 were small and diffusely diseased and not graft-able.  RCA:  Large vessel that was graft-able distally.   Grafts:  1. LIMA to the LAD: 2.5 mm. It was sewn end to side using 8-0 prolene continuous suture. 2. SVG to Ramus:  2.0 mm. It was sewn end to side using 7-0 prolene continuous suture. 3. SVG to RCA:  2.5 mm. It was sewn end to side using 7-0 prolene continuous suture.   The proximal vein graft anastomoses were performed to the mid-ascending aorta using continuous 6-0 prolene suture. The ascending aorta was thick-walled. Graft markers were placed around the proximal anastomoses.   Completion:  The patient was rewarmed to 37 degrees Centigrade. The clamp was removed from the LIMA pedicle and there was rapid warming of the septum and return of ventricular fibrillation. The crossclamp was removed with a time of 73 minutes. There was spontaneous return of sinus rhythm. The distal and proximal anastomoses were checked for hemostasis. The position of the grafts was satisfactory. Two temporary epicardial pacing wires were placed on the right atrium and two on the right ventricle. The patient was weaned from CPB without difficulty on no inotropes. CPB time was 93 minutes. Cardiac output was 5 LPM. Heparin was fully reversed with protamine  and the aortic and venous cannulas removed. Hemostasis was achieved. Mediastinal and left pleural drainage tubes were placed. The sternum was closed with double #6 stainless steel wires. The fascia was closed with continuous # 1 vicryl suture. The subcutaneous tissue was closed with 2-0 vicryl continuous suture. The skin was closed with 3-0 vicryl subcuticular suture. All sponge, needle, and instrument counts were reported correct at the end of the case. Dry sterile dressings were placed over the incisions and around the chest tubes which were connected to pleurevac suction.  The patient was then transported to the surgical intensive care unit in critical but stable condition.

## 2017-04-24 NOTE — Care Management Important Message (Signed)
Important Message  Patient Details  Name: Kevin Reeves MRN: [id number] Date of Birth: [birth year]   Medicare Important Message Given:  Yes    Kyla Balzarine 04/24/2017, 9:36 AM

## 2017-04-24 NOTE — Progress Notes (Signed)
Progress Note  Patient Name: Kevin Reeves Date of Encounter: 04/24/2017  Primary Cardiologist: Wyline Mood  Subjective   No chest pain or dyspnea.  Family at bedside.  Feels well today.  Awaiting CABG this afternoon.  Inpatient Medications    Scheduled Meds: . aspirin EC  81 mg Oral QPM  . diazepam  5 mg Oral Once  . heparin-papaverine-plasmalyte irrigation   Irrigation To OR  . magnesium sulfate  40 mEq Other To OR  . metoprolol succinate  25 mg Oral Daily  . metoprolol tartrate  12.5 mg Oral Once  . multivitamin with minerals  1 tablet Oral Daily  . pantoprazole  40 mg Oral Daily  . potassium chloride  80 mEq Other To OR  . sodium chloride flush  3 mL Intravenous Q12H  . tranexamic acid  15 mg/kg Intravenous To OR  . tranexamic acid  2 mg/kg Intracatheter To OR   Continuous Infusions: . sodium chloride    . cefUROXime (ZINACEF)  IV    . cefUROXime (ZINACEF)  IV    . dexmedetomidine    . DOPamine    . epinephrine    . heparin 30,000 units/NS 1000 mL solution for CELLSAVER    . insulin (NOVOLIN-R) infusion    . nitroGLYCERIN    . phenylephrine 20mg /245mL NS (0.08mg /ml) infusion    . tranexamic acid (CYKLOKAPRON) infusion ([facility])    . vancomycin     PRN Meds: sodium chloride, acetaminophen, nitroGLYCERIN, ondansetron (ZOFRAN) IV, sodium chloride flush, temazepam   Vital Signs    Vitals:   04/23/17 2145 04/24/17 0549 04/24/17 0558 04/24/17 0819  BP: 111/62 116/65    Pulse: 62  (!) 58 (!) 102  Resp: 18 16    Temp: 97.8 F (36.6 C) (!) 97.4 F (36.3 C)    TempSrc: Oral Oral    SpO2: 100% 99%    Weight:  179 lb (81.2 kg)    Height:       No intake or output data in the 24 hours ending 04/24/17 1004 Filed Weights   04/22/17 0500 04/23/17 0539 04/24/17 0549  Weight: 179 lb 14.4 oz (81.6 kg) 180 lb 8 oz (81.9 kg) 179 lb (81.2 kg)    Telemetry    Sinus rhythm - Personally Reviewed   Physical Exam  Alert, oriented male in NAD, sitting in chair at  bedside GEN: No acute distress.   Neck: No JVD Cardiac: RRR, no murmurs, rubs, or gallops.  Respiratory: Clear to auscultation bilaterally. GI: Soft, nontender, non-distended  MS: No edema; No deformity. Neuro:  Nonfocal  Psych: Normal affect   Labs    Chemistry Recent Labs Lab 04/18/17 0925 04/20/17 0340 04/24/17 0407  NA 138 136 137  K 4.4 4.2 4.4  CL 102 106 104  CO2 28 22 25   GLUCOSE 105* 106* 99  BUN 15 16 17   CREATININE 1.12 1.06 1.10  CALCIUM 9.4 8.6* 9.0  GFRNONAA >60 >60 >60  GFRAA >60 >60 >60  ANIONGAP 8 8 8      Hematology Recent Labs Lab 04/18/17 0925 04/20/17 0340 04/24/17 0407  WBC 10.0 9.2 10.3  RBC 4.53 4.20* 4.43  HGB 12.6* 11.3* 11.9*  HCT 39.8 36.3* 38.5*  MCV 87.9 86.4 86.9  MCH 27.8 26.9 26.9  MCHC 31.7 31.1 30.9  RDW 15.0 15.4 15.7*  PLT 337 301 334    Cardiac EnzymesNo results for input(s): TROPONINI in the last 168 hours. No results for input(s): TROPIPOC in the last 168  hours.   BNPNo results for input(s): BNP, PROBNP in the last 168 hours.   DDimer No results for input(s): DDIMER in the last 168 hours.   Radiology    Dg Chest 2 View  Result Date: 04/23/2017 CLINICAL DATA:  Coronary artery disease.  Preop, CABG tomorrow. EXAM: CHEST  2 VIEW COMPARISON:  Radiographs 05/09/2014 FINDINGS: The cardiomediastinal contours are normal. Mild left lung base scarring. Pulmonary vasculature is normal. No consolidation, pleural effusion, or pneumothorax. No acute osseous abnormalities are seen. IMPRESSION: No acute abnormality.  Mild left lung base scarring. Electronically Signed   By: Rubye Oaks M.D.   On: 04/23/2017 22:00    Cardiac Studies   Cardiac Cath 04-19-2017: Conclusion     Mid RCA lesion, 80 %stenosed.  Dist RCA lesion, 60 %stenosed.  Ost Cx to Prox Cx lesion, 99 %stenosed.  Ramus-2 lesion, 95 %stenosed.  Ramus-1 lesion, 10 %stenosed.  Ost 1st Mrg to 1st Mrg lesion, 80 %stenosed.  Ost 2nd Mrg to 2nd Mrg  lesion, 90 %stenosed.  Ost 2nd Diag to 2nd Diag lesion, 99 %stenosed.  Prox LAD to Mid LAD lesion, 90 %stenosed.  Mid LAD lesion, 80 %stenosed.  Dist LAD lesion, 70 %stenosed.  The left ventricular systolic function is normal.  LV end diastolic pressure is normal.  The left ventricular ejection fraction is 50-55% by visual estimate.  There is no mitral valve regurgitation.   1. Severe triple vessel CAD 2. Preserved LV systolic function  Recommendations: Will consult CT surgery for CABG. He has been on Plavix. Will need Plavix washout prior to surgery. He may be able to go home tomorrow if CABG is planned for next week.     Echo 04-21-2017: Study Conclusions  - Left ventricle: The cavity size was normal. Systolic function was   normal. The estimated ejection fraction was in the range of 55%   to 60%. There is akinesis of the basalinferior myocardium.   Doppler parameters are consistent with abnormal left ventricular   relaxation (grade 1 diastolic dysfunction). - Aortic valve: Trileaflet; moderately thickened, moderately   calcified leaflets. Valve mobility was restricted. - Mitral valve: There was mild regurgitation.  Impressions:  - Basal inferior wall akinesis appears new since prior study.  Patient Profile     76 y.o. male with PMH of CAD s/p multiple stents, ICM, HTN, Carotid stenosis, and HL who was referred for outpatient cath by Dr. Wyline Mood. Underwent cath with Dr. Clifton James with severe 3v disease. TCTS consulted. Plans for CABG 10-29 after plavix washout  Assessment & Plan    1.  Coronary artery disease with angina: Severe multivessel coronary artery disease identified a cardiac catheterization.  Patient awaiting CABG this afternoon.  He is stable with no resting angina.  2.  Hypertension: Blood pressures controlled on current regimen with most recent blood pressure is 111-116/62-65  3.  Hyperlipidemia: Statin intolerant.  Need to consider PCSK9 inhibitor  at discharge.  Dispo: will FU after CABG this afternoon  For questions or updates, please contact [facility] Please consult [email or web address] for contact info under Cardiology/STEMI.   Enzo Bi, MD  04/24/2017, 10:04 AM

## 2017-04-24 NOTE — Anesthesia Postprocedure Evaluation (Signed)
Anesthesia Post Note  Patient: SAADIQ POCHE  Procedure(s) Performed: CORONARY ARTERY BYPASS GRAFTING (CABG)x3 using left internal mammary artery and left greater saphenous vein, bilateral saphenous vein harvested endoscopically (N/A Chest) TRANSESOPHAGEAL ECHOCARDIOGRAM (TEE) (N/A )     Patient location during evaluation: SICU Anesthesia Type: General Level of consciousness: sedated and patient remains intubated per anesthesia plan Vital Signs Assessment: post-procedure vital signs reviewed and stable Respiratory status: patient on ventilator - see flowsheet for VS and patient remains intubated per anesthesia plan Cardiovascular status: blood pressure returned to baseline Anesthetic complications: no    Last Vitals:  Vitals:   04/24/17 2036 04/24/17 2045  BP:    Pulse: 88 88  Resp: (!) 9 14  Temp: (!) 35.4 C (!) 35.4 C  SpO2: 100% 100%    Last Pain:  Vitals:   04/24/17 2000  TempSrc: Core (Comment)  PainSc:                  Deandrea Vanpelt COKER

## 2017-04-24 NOTE — Brief Op Note (Signed)
04/19/2017 - 04/24/2017  5:13 PM  PATIENT:  Kevin Reeves  76 y.o. male  PRE-OPERATIVE DIAGNOSIS:  CAD  POST-OPERATIVE DIAGNOSIS:  CAD  PROCEDURE: TRANSESOPHAGEAL ECHOCARDIOGRAM (TEE), MEDIAN STERNOTOMY for CORONARY ARTERY BYPASS GRAFTING (CABG )x 3 (LIMA to LAD, SVG to RAMUS INTERMEDIATE, SVG to RCA)  using left internal mammary artery, left thigh greater saphenous vein, and right greater saphenous vein harvested endoscopically  SURGEON:  Surgeon(s) and Role:    * Alleen Borne, MD - Primary  PHYSICIAN ASSISTANT: Doree Fudge PA-C  ANESTHESIA:   general  DRAINS: Chest tubes placed in the mediastinal and pleural spaces   COUNTS CORRECT:  YES  DICTATION: .Dragon Dictation  PLAN OF CARE: Admit to inpatient   PATIENT DISPOSITION:  ICU - intubated and hemodynamically stable.   Delay start of Pharmacological VTE agent (>24hrs) due to surgical blood loss or risk of bleeding: yes  BASELINE WEIGHT: 81.2 kg

## 2017-04-24 NOTE — Anesthesia Procedure Notes (Signed)
Arterial Line Insertion Start/End10/29/2018 12:50 PM, 04/24/2017 12:53 PM Performed by: Epifanio Lesches, CRNA  Patient location: PACU. Preanesthetic checklist: patient identified, IV checked, site marked, risks and benefits discussed, surgical consent, monitors and equipment checked, pre-op evaluation, timeout performed and anesthesia consent Lidocaine 1% used for infiltration and patient sedated Left, radial was placed Catheter size: 20 G Hand hygiene performed  and maximum sterile barriers used  Allen's test indicative of satisfactory collateral circulation Attempts: 1 Procedure performed without using ultrasound guided technique. Ultrasound Notes:anatomy identified Following insertion, dressing applied and Biopatch. Post procedure assessment: normal  Patient tolerated the procedure well with no immediate complications.

## 2017-04-24 NOTE — Plan of Care (Signed)
Problem: Pain Management Goal: General experience of comfort will improve Outcome: Progressing Pt denies any CP.

## 2017-04-24 NOTE — Progress Notes (Signed)
Pt had Pause of 3.69, per Tele. Paged MD on call. No new orders placed. Pt is asymptomatic. Will continue to monitor.

## 2017-04-24 NOTE — Progress Notes (Signed)
Patient ID: Kevin Reeves, male   DOB: 1941-04-05, 76 y.o.   MRN: [id number] Cardiothoracic Surgery  The patient has been stable over the weekend without chest pain or shortness of breath. Ready for CABG today.

## 2017-04-24 NOTE — Anesthesia Preprocedure Evaluation (Signed)
Anesthesia Evaluation  Patient identified by MRN, date of birth, ID band Patient awake    Reviewed: Allergy & Precautions, NPO status , Patient's Chart, lab work & pertinent test results  Airway Mallampati: II  TM Distance: >3 FB Neck ROM: Full    Dental  (+) Teeth Intact, Dental Advisory Given   Pulmonary    breath sounds clear to auscultation       Cardiovascular hypertension,  Rhythm:Regular Rate:Normal     Neuro/Psych    GI/Hepatic   Endo/Other    Renal/GU      Musculoskeletal   Abdominal   Peds  Hematology   Anesthesia Other Findings   Reproductive/Obstetrics                             Anesthesia Physical Anesthesia Plan  ASA: III  Anesthesia Plan: General   Post-op Pain Management:    Induction: Intravenous  PONV Risk Score and Plan: Ondansetron and Dexamethasone  Airway Management Planned: Oral ETT  Additional Equipment: PA Cath, Arterial line, 3D TEE and Ultrasound Guidance Line Placement  Intra-op Plan:   Post-operative Plan: Post-operative intubation/ventilation  Informed Consent: I have reviewed the patients History and Physical, chart, labs and discussed the procedure including the risks, benefits and alternatives for the proposed anesthesia with the patient or authorized representative who has indicated his/her understanding and acceptance.   Dental advisory given  Plan Discussed with: CRNA and Anesthesiologist  Anesthesia Plan Comments:         Anesthesia Quick Evaluation

## 2017-04-25 ENCOUNTER — Inpatient Hospital Stay (HOSPITAL_COMMUNITY): Payer: Medicare HMO

## 2017-04-25 ENCOUNTER — Encounter (HOSPITAL_COMMUNITY): Payer: Self-pay | Admitting: Surgery

## 2017-04-25 DIAGNOSIS — Z951 Presence of aortocoronary bypass graft: Secondary | ICD-10-CM

## 2017-04-25 LAB — MAGNESIUM
Magnesium: 2.3 mg/dL (ref 1.7–2.4)
Magnesium: 2.3 mg/dL (ref 1.7–2.4)

## 2017-04-25 LAB — CBC
HCT: 23.7 % — ABNORMAL LOW (ref 39.0–52.0)
HEMATOCRIT: 25.8 % — AB (ref 39.0–52.0)
HEMOGLOBIN: 8.3 g/dL — AB (ref 13.0–17.0)
HEMOGLOBIN: 7.6 g/dL — AB (ref 13.0–17.0)
MCH: 27.5 pg (ref 26.0–34.0)
MCH: 27.1 pg (ref 26.0–34.0)
MCHC: 32.2 g/dL (ref 30.0–36.0)
MCHC: 32.1 g/dL (ref 30.0–36.0)
MCV: 85.4 fL (ref 78.0–100.0)
MCV: 84.6 fL (ref 78.0–100.0)
PLATELETS: 201 10*3/uL (ref 150–400)
Platelets: 203 10*3/uL (ref 150–400)
RBC: 3.02 MIL/uL — ABNORMAL LOW (ref 4.22–5.81)
RBC: 2.8 MIL/uL — AB (ref 4.22–5.81)
RDW: 15.2 % (ref 11.5–15.5)
RDW: 15.6 % — ABNORMAL HIGH (ref 11.5–15.5)
WBC: 8.6 10*3/uL (ref 4.0–10.5)
WBC: 10.7 10*3/uL — AB (ref 4.0–10.5)

## 2017-04-25 LAB — POCT I-STAT 3, ART BLOOD GAS (G3+)
Acid-base deficit: 3 mmol/L — ABNORMAL HIGH (ref 0.0–2.0)
Acid-base deficit: 4 mmol/L — ABNORMAL HIGH (ref 0.0–2.0)
BICARBONATE: 20.8 mmol/L (ref 20.0–28.0)
BICARBONATE: 21.5 mmol/L (ref 20.0–28.0)
O2 Saturation: 99 %
O2 Saturation: 99 %
PCO2 ART: 35.3 mmHg (ref 32.0–48.0)
PCO2 ART: 37.2 mmHg (ref 32.0–48.0)
PH ART: 7.393 (ref 7.350–7.450)
PO2 ART: 130 mmHg — AB (ref 83.0–108.0)
TCO2: 22 mmol/L (ref 22–32)
TCO2: 23 mmol/L (ref 22–32)
pH, Arterial: 7.356 (ref 7.350–7.450)
pO2, Arterial: 153 mmHg — ABNORMAL HIGH (ref 83.0–108.0)

## 2017-04-25 LAB — POCT I-STAT, CHEM 8
BUN: 15 mg/dL (ref 6–20)
BUN: 12 mg/dL (ref 6–20)
CALCIUM ION: 1.1 mmol/L — AB (ref 1.15–1.40)
CALCIUM ION: 1.17 mmol/L (ref 1.15–1.40)
CHLORIDE: 105 mmol/L (ref 101–111)
CHLORIDE: 103 mmol/L (ref 101–111)
Creatinine, Ser: 0.9 mg/dL (ref 0.61–1.24)
Creatinine, Ser: 0.7 mg/dL (ref 0.61–1.24)
GLUCOSE: 117 mg/dL — AB (ref 65–99)
GLUCOSE: 154 mg/dL — AB (ref 65–99)
HCT: 28 % — ABNORMAL LOW (ref 39.0–52.0)
HCT: 23 % — ABNORMAL LOW (ref 39.0–52.0)
Hemoglobin: 9.5 g/dL — ABNORMAL LOW (ref 13.0–17.0)
Hemoglobin: 7.8 g/dL — ABNORMAL LOW (ref 13.0–17.0)
Potassium: 4.3 mmol/L (ref 3.5–5.1)
Potassium: 4 mmol/L (ref 3.5–5.1)
Sodium: 139 mmol/L (ref 135–145)
Sodium: 136 mmol/L (ref 135–145)
TCO2: 22 mmol/L (ref 22–32)
TCO2: 21 mmol/L — ABNORMAL LOW (ref 22–32)

## 2017-04-25 LAB — GLUCOSE, CAPILLARY
GLUCOSE-CAPILLARY: 105 mg/dL — AB (ref 65–99)
Glucose-Capillary: 120 mg/dL — ABNORMAL HIGH (ref 65–99)
Glucose-Capillary: 126 mg/dL — ABNORMAL HIGH (ref 65–99)
Glucose-Capillary: 138 mg/dL — ABNORMAL HIGH (ref 65–99)
Glucose-Capillary: 153 mg/dL — ABNORMAL HIGH (ref 65–99)

## 2017-04-25 LAB — BASIC METABOLIC PANEL
Anion gap: 6 (ref 5–15)
BUN: 12 mg/dL (ref 6–20)
CO2: 20 mmol/L — AB (ref 22–32)
CREATININE: 0.97 mg/dL (ref 0.61–1.24)
Calcium: 7.6 mg/dL — ABNORMAL LOW (ref 8.9–10.3)
Chloride: 109 mmol/L (ref 101–111)
GFR calc Af Amer: >60 mL/min (ref >60)
GFR calc non Af Amer: >60 mL/min (ref >60)
Glucose, Bld: 149 mg/dL — ABNORMAL HIGH (ref 65–99)
Potassium: 4.3 mmol/L (ref 3.5–5.1)
Sodium: 135 mmol/L (ref 135–145)

## 2017-04-25 LAB — HEMOGLOBIN A1C
HEMOGLOBIN A1C: 5.8 % — AB (ref 4.8–5.6)
Mean Plasma Glucose: 119.76 mg/dL

## 2017-04-25 LAB — CREATININE, SERUM
CREATININE: 0.9 mg/dL (ref 0.61–1.24)
GFR calc non Af Amer: >60 mL/min (ref >60)

## 2017-04-25 MED ORDER — CHLORHEXIDINE GLUCONATE CLOTH 2 % EX PADS
6.0000 | MEDICATED_PAD | Freq: Every day | CUTANEOUS | Status: DC
  Administered 2017-04-25 – 2017-04-28 (×4): 6 via TOPICAL

## 2017-04-25 MED ORDER — TRAMADOL HCL 50 MG PO TABS: 50.0000 mg | ORAL_TABLET | Freq: Four times a day (QID) | ORAL | Status: DC | PRN

## 2017-04-25 MED ORDER — INSULIN ASPART 100 UNIT/ML ~~LOC~~ SOLN
0.0000 [IU] | SUBCUTANEOUS | Status: DC
  Administered 2017-04-25 – 2017-04-26 (×5): 2 [IU] via SUBCUTANEOUS

## 2017-04-25 MED ORDER — MUPIROCIN 2 % EX OINT
1.0000 "application " | TOPICAL_OINTMENT | Freq: Two times a day (BID) | CUTANEOUS | Status: DC
  Administered 2017-04-25 – 2017-04-28 (×8): 1 via NASAL
  Filled 2017-04-25 (×2): qty 22

## 2017-04-25 MED FILL — Heparin Sodium (Porcine) Inj 1000 Unit/ML: INTRAMUSCULAR | Qty: 10 | Status: AC

## 2017-04-25 MED FILL — Electrolyte-R (PH 7.4) Solution: INTRAVENOUS | Qty: 4000 | Status: AC

## 2017-04-25 MED FILL — Sodium Chloride IV Soln 0.9%: INTRAVENOUS | Qty: 2000 | Status: AC

## 2017-04-25 MED FILL — Sodium Bicarbonate IV Soln 8.4%: INTRAVENOUS | Qty: 50 | Status: AC

## 2017-04-25 MED FILL — Mannitol IV Soln 20%: INTRAVENOUS | Qty: 500 | Status: AC

## 2017-04-25 MED FILL — Lidocaine HCl IV Inj 20 MG/ML: INTRAVENOUS | Qty: 5 | Status: AC

## 2017-04-25 MED FILL — Thrombin (Recombinant) For Soln 5000 Unit: CUTANEOUS | Qty: 3 | Status: AC

## 2017-04-25 NOTE — Procedures (Signed)
Extubation Procedure Note  Patient Details:   Name: MARKEVION LATTIN DOB: 08-Apr-1941 MRN: [id number]   Airway Documentation:     Evaluation  O2 sats: stable throughout Complications: No apparent complications Patient did tolerate procedure well. Bilateral Breath Sounds: Clear, Diminished   Yes  Aleksandar Duve A Tranell Wojtkiewicz 04/25/2017, 12:42 AM

## 2017-04-25 NOTE — Plan of Care (Signed)
Problem: Activity: Goal: Risk for activity intolerance will decrease Outcome: Progressing Patient stable POD 1, was OOB and up to chair for 3 hours. Walked 270 feet in the hall with assistance. Progressing well post op.  Problem: Nutritional: Goal: Risk for body nutrition deficit will decrease Outcome: Progressing Tolerating clears, will advance diet per protocol.   Problem: Pain Management: Goal: Pain level will decrease Outcome: Progressing Patient using appropriate splinting and sternal precautions to minimize pain. Patient taking pain medication as needed and tolerating activity well.

## 2017-04-25 NOTE — Care Management Note (Signed)
Case Management Note Donn Pierini RN, BSN Unit 4E-Case Manager- 2H coverage [phone]  Patient Details  Name: Kevin Reeves MRN: [id number] Date of Birth: [birth year]  Subjective/Objective:   Pt admitted with CAD- s/p CABG on 10/29                 Action/Plan: PTA pt lived at home with spouse- anticipate return home- CM to follow for d/c needs  Expected Discharge Date:                  Expected Discharge Plan:  Home/Self Care  In-House Referral:     Discharge planning Services  CM Consult  Post Acute Care Choice:    Choice offered to:     DME Arranged:    DME Agency:     HH Arranged:    HH Agency:     Status of Service:  In process, will continue to follow  If discussed at Long Length of Stay Meetings, dates discussed:    Discharge Disposition:   Additional Comments:  Darrold Span, RN 04/25/2017, 11:17 AM

## 2017-04-25 NOTE — Anesthesia Postprocedure Evaluation (Signed)
Anesthesia Post Note  Patient: HALEY ROZA  Procedure(s) Performed: CORONARY ARTERY BYPASS GRAFTING (CABG)x3 using left internal mammary artery and left greater saphenous vein, bilateral saphenous vein harvested endoscopically (N/A Chest) TRANSESOPHAGEAL ECHOCARDIOGRAM (TEE) (N/A )     Patient location during evaluation: SICU Anesthesia Type: General Level of consciousness: patient remains intubated per anesthesia plan and sedated Vital Signs Assessment: post-procedure vital signs reviewed and stable Respiratory status: patient on ventilator - see flowsheet for VS and patient remains intubated per anesthesia plan Anesthetic complications: no    Last Vitals:  Vitals:   04/25/17 1900 04/25/17 2000  BP: 104/64 132/73  Pulse: 88 97  Resp: (!) 22 (!) 24  Temp:  36.8 C  SpO2: 96% 96%    Last Pain:  Vitals:   04/25/17 2000  TempSrc: Oral  PainSc:                  Anjannette Gauger COKER

## 2017-04-25 NOTE — Progress Notes (Signed)
Patient examined and record reviewed.Hemodynamics stable,labs satisfactory.Patient had stable day.Continue current care. Kathlee Nations Trigt III 04/25/2017

## 2017-04-25 NOTE — Procedures (Signed)
FVC 1.2 and  NIF -20

## 2017-04-25 NOTE — Progress Notes (Signed)
1 Day Post-Op Procedure(s) (LRB): CORONARY ARTERY BYPASS GRAFTING (CABG)x3 using left internal mammary artery and left greater saphenous vein, bilateral saphenous vein harvested endoscopically (N/A) TRANSESOPHAGEAL ECHOCARDIOGRAM (TEE) (N/A) Subjective: No complaints  Objective: Vital signs in last 24 hours: Temp:  [95.7 F (35.4 C)-98.6 F (37 C)] 97.7 F (36.5 C) (10/30 0700) Pulse Rate:  [80-102] 88 (10/30 0700) Cardiac Rhythm: Atrial paced (10/30 0400) Resp:  [7-23] 11 (10/30 0700) BP: (107-156)/(69-93) 114/69 (10/30 0700) SpO2:  [98 %-100 %] 100 % (10/30 0700) Arterial Line BP: (78-147)/(50-80) 109/56 (10/30 0700) FiO2 (%):  [40 %-50 %] 40 % (10/30 0000) Weight:  [88.3 kg (194 lb 10.7 oz)] 88.3 kg (194 lb 10.7 oz) (10/30 0500)  Hemodynamic parameters for last 24 hours: PAP: (18-31)/(7-19) 21/9 CO:  [3.2 L/min-4.6 L/min] 4.6 L/min CI:  [1.7 L/min/m2-2.4 L/min/m2] 2.4 L/min/m2  Intake/Output from previous day: 10/29 0701 - 10/30 0700 In: 5427.8 [I.V.:3399.8; Blood:378; IV Piggyback:1650] Out: 3020 [Urine:1695; Blood:875; Chest Tube:450] Intake/Output this shift: No intake/output data recorded.  General appearance: alert and cooperative Neurologic: intact Heart: regular rate and rhythm, S1, S2 normal, no murmur, click, rub or gallop Lungs: clear to auscultation bilaterally Extremities: edema mild Wound: dressings dry  Lab Results:  Recent Labs  04/24/17 1900 04/25/17 0340  WBC 14.9* 8.6  HGB 9.8* 8.3*  HCT 31.0* 25.8*  PLT 226 203   BMET:  Recent Labs  04/24/17 0407  04/24/17 1744 04/25/17 0340  NA 137  < > 138 135  K 4.4  < > 4.8 4.3  CL 104  < > 104 109  CO2 25  --   --  20*  GLUCOSE 99  < > 146* 149*  BUN 17  < > 15 12  CREATININE 1.10  < > 0.80 0.97  CALCIUM 9.0  --   --  7.6*  < > = values in this interval not displayed.  PT/INR:  Recent Labs  04/24/17 1900  LABPROT 17.1*  INR 1.41   ABG    Component Value Date/Time   PHART 7.356  04/25/2017 0128   HCO3 20.8 04/25/2017 0128   TCO2 22 04/25/2017 0128   ACIDBASEDEF 4.0 (H) 04/25/2017 0128   O2SAT 99.0 04/25/2017 0128   CBG (last 3)   Recent Labs  04/24/17 2059 04/24/17 2322 04/25/17 0323  GLUCAP 71 108* 153*   CXR: ok  ECG: sinus,  no acute changes, prolonged QTc  Assessment/Plan: S/P Procedure(s) (LRB): CORONARY ARTERY BYPASS GRAFTING (CABG)x3 using left internal mammary artery and left greater saphenous vein, bilateral saphenous vein harvested endoscopically (N/A) TRANSESOPHAGEAL ECHOCARDIOGRAM (TEE) (N/A)  POD 1  He is hemodynamically stable on neo with good CI. Will wean neo as tolerated. Hold beta blocker for now until stable off neo.  Expected acute postop blood loss anemia: observe  Volume excess: hold on diuresis while on neo.  DC swan  Keep chest tubes for now but can be OOB.   LOS: 6 days    Alleen Borne 04/25/2017

## 2017-04-25 NOTE — Progress Notes (Signed)
Foley bag leaking from hole in bag, foley seal broken and bag replaced.

## 2017-04-26 ENCOUNTER — Inpatient Hospital Stay (HOSPITAL_COMMUNITY): Payer: Medicare HMO

## 2017-04-26 LAB — BASIC METABOLIC PANEL
ANION GAP: 6 (ref 5–15)
BUN: 9 mg/dL (ref 6–20)
CALCIUM: 8 mg/dL — AB (ref 8.9–10.3)
CO2: 24 mmol/L (ref 22–32)
Chloride: 105 mmol/L (ref 101–111)
Creatinine, Ser: 0.94 mg/dL (ref 0.61–1.24)
Glucose, Bld: 138 mg/dL — ABNORMAL HIGH (ref 65–99)
Potassium: 3.8 mmol/L (ref 3.5–5.1)
SODIUM: 135 mmol/L (ref 135–145)

## 2017-04-26 LAB — CBC
HCT: 23.2 % — ABNORMAL LOW (ref 39.0–52.0)
HEMOGLOBIN: 7.3 g/dL — AB (ref 13.0–17.0)
MCH: 26.8 pg (ref 26.0–34.0)
MCHC: 31.5 g/dL (ref 30.0–36.0)
MCV: 85.3 fL (ref 78.0–100.0)
PLATELETS: 185 10*3/uL (ref 150–400)
RBC: 2.72 MIL/uL — AB (ref 4.22–5.81)
RDW: 15.7 % — ABNORMAL HIGH (ref 11.5–15.5)
WBC: 9.8 10*3/uL (ref 4.0–10.5)

## 2017-04-26 LAB — GLUCOSE, CAPILLARY
GLUCOSE-CAPILLARY: 128 mg/dL — AB (ref 65–99)
GLUCOSE-CAPILLARY: 119 mg/dL — AB (ref 65–99)
Glucose-Capillary: 125 mg/dL — ABNORMAL HIGH (ref 65–99)

## 2017-04-26 MED ORDER — SIMVASTATIN 40 MG PO TABS
40.0000 mg | ORAL_TABLET | Freq: Every day | ORAL | Status: DC
  Administered 2017-04-26 – 2017-04-28 (×3): 40 mg via ORAL
  Filled 2017-04-26 (×3): qty 1

## 2017-04-26 MED ORDER — POTASSIUM CHLORIDE CRYS ER 20 MEQ PO TBCR
40.0000 meq | EXTENDED_RELEASE_TABLET | Freq: Once | ORAL | Status: AC
  Administered 2017-04-26: 40 meq via ORAL
  Filled 2017-04-26: qty 2

## 2017-04-26 MED ORDER — FERROUS GLUCONATE 324 (38 FE) MG PO TABS
324.0000 mg | ORAL_TABLET | Freq: Two times a day (BID) | ORAL | Status: DC
  Administered 2017-04-26 – 2017-04-29 (×6): 324 mg via ORAL
  Filled 2017-04-26 (×6): qty 1

## 2017-04-26 MED ORDER — FUROSEMIDE 10 MG/ML IJ SOLN
40.0000 mg | Freq: Once | INTRAMUSCULAR | Status: AC
  Administered 2017-04-26: 40 mg via INTRAVENOUS
  Filled 2017-04-26: qty 4

## 2017-04-26 MED FILL — Magnesium Sulfate Inj 50%: INTRAMUSCULAR | Qty: 10 | Status: AC

## 2017-04-26 MED FILL — Heparin Sodium (Porcine) Inj 1000 Unit/ML: INTRAMUSCULAR | Qty: 30 | Status: AC

## 2017-04-26 MED FILL — Potassium Chloride Inj 2 mEq/ML: INTRAVENOUS | Qty: 40 | Status: AC

## 2017-04-26 NOTE — Progress Notes (Signed)
Progress Note  Patient Name: Kevin Reeves Date of Encounter: 04/26/2017  Primary Cardiologist: Dr. Wyline Mood  Subjective   Feels okay.  No chest pain or shortness of breath  Inpatient Medications    Scheduled Meds: . acetaminophen  1,000 mg Oral Q6H   Or  . acetaminophen (TYLENOL) oral liquid 160 mg/5 mL  1,000 mg Per Tube Q6H  . aspirin EC  325 mg Oral Daily   Or  . aspirin  324 mg Per Tube Daily  . bisacodyl  10 mg Oral Daily   Or  . bisacodyl  10 mg Rectal Daily  . Chlorhexidine Gluconate Cloth  6 each Topical Daily  . docusate sodium  200 mg Oral Daily  . insulin aspart  0-24 Units Subcutaneous Q4H  . multivitamin with minerals  1 tablet Oral Daily  . mupirocin ointment  1 application Nasal BID  . pantoprazole  40 mg Oral Daily  . simvastatin  20 mg Oral q1800  . sodium chloride flush  3 mL Intravenous Q12H   Continuous Infusions: . sodium chloride Stopped (04/25/17 0805)  . sodium chloride    . sodium chloride Stopped (04/25/17 0805)  . cefUROXime (ZINACEF)  IV Stopped (04/25/17 2217)  . lactated ringers Stopped (04/25/17 0000)  . lactated ringers 20 mL/hr at 04/25/17 1900  . phenylephrine (NEO-SYNEPHRINE) Adult infusion Stopped (04/25/17 1810)   PRN Meds: sodium chloride, metoprolol tartrate, morphine injection, ondansetron (ZOFRAN) IV, oxyCODONE, sodium chloride flush, traMADol   Vital Signs    Vitals:   04/26/17 0500 04/26/17 0600 04/26/17 0700 04/26/17 0806  BP: 107/63 113/61 105/65   Pulse: 90 90 96   Resp: 20 18 20    Temp:    98.2 F (36.8 C)  TempSrc:    Oral  SpO2: 98% 99% 94%   Weight: 192 lb 3.2 oz (87.2 kg)     Height:        Intake/Output Summary (Last 24 hours) at 04/26/17 0941 Last data filed at 04/26/17 0700  Gross per 24 hour  Intake          1154.93 ml  Output             1540 ml  Net          -385.07 ml   Filed Weights   04/24/17 0549 04/25/17 0500 04/26/17 0500  Weight: 179 lb (81.2 kg) 194 lb 10.7 oz (88.3 kg) 192 lb 3.2  oz (87.2 kg)    Telemetry    Sinus rhythm/sinus tachycardia - Personally Reviewed   Physical Exam  Pleasant male, sitting up in chair, no distress GEN: No acute distress.   Neck: No JVD Cardiac:  Tachycardic and regular, no murmurs, rubs, or gallops.  Respiratory: Clear to auscultation bilaterally. GI: Soft, nontender, non-distended  MS:  2+ bilateral pretibial edema; No deformity. Neuro:  Nonfocal  Psych: Normal affect   Labs    Chemistry Recent Labs Lab 04/24/17 0407  04/25/17 0340 04/25/17 1631 04/25/17 1639 04/26/17 0403  NA 137  < > 135  --  136 135  K 4.4  < > 4.3  --  4.0 3.8  CL 104  < > 109  --  103 105  CO2 25  --  20*  --   --  24  GLUCOSE 99  < > 149*  --  154* 138*  BUN 17  < > 12  --  12 9  CREATININE 1.10  < > 0.97 0.90 0.70 0.94  CALCIUM  9.0  --  7.6*  --   --  8.0*  GFRNONAA >60  --  >60 >60  --  >60  GFRAA >60  --  >60 >60  --  >60  ANIONGAP 8  --  6  --   --  6  < > = values in this interval not displayed.   Hematology Recent Labs Lab 04/25/17 0340 04/25/17 1631 04/25/17 1639 04/26/17 0403  WBC 8.6 10.7*  --  9.8  RBC 3.02* 2.80*  --  2.72*  HGB 8.3* 7.6* 7.8* 7.3*  HCT 25.8* 23.7* 23.0* 23.2*  MCV 85.4 84.6  --  85.3  MCH 27.5 27.1  --  26.8  MCHC 32.2 32.1  --  31.5  RDW 15.2 15.6*  --  15.7*  PLT 203 201  --  185    Cardiac EnzymesNo results for input(s): TROPONINI in the last 168 hours. No results for input(s): TROPIPOC in the last 168 hours.   BNPNo results for input(s): BNP, PROBNP in the last 168 hours.   DDimer No results for input(s): DDIMER in the last 168 hours.   Radiology    Dg Chest Port 1 View  Result Date: 04/26/2017 CLINICAL DATA:  Post CABG EXAM: PORTABLE CHEST 1 VIEW COMPARISON:  04/25/2017 FINDINGS: Interval removal of Swan-Ganz catheter and left chest tube. No visible pneumothorax. Changes of CABG. For perihilar and bibasilar airspace opacities are again noted, unchanged. Possible small layering effusions.  IMPRESSION: No significant change in the bilateral perihilar and bibasilar opacities. Suspect small effusions. No pneumothorax. Electronically Signed   By: Charlett Nose M.D.   On: 04/26/2017 08:32   Dg Chest Port 1 View  Result Date: 04/25/2017 CLINICAL DATA:  Status post coronary artery bypass graft. EXAM: PORTABLE CHEST 1 VIEW COMPARISON:  Radiograph of April 24, 2017. FINDINGS: Stable cardiomegaly. Status post coronary artery bypass graft. Endotracheal and nasogastric tubes have been removed. Stable position of right internal jugular Swan-Ganz catheter. Left-sided chest tube is unchanged position. No definite pneumothorax is noted. Mild right basilar subsegmental atelectasis is noted. Stable left basilar atelectasis is noted with associated pleural effusion. Bony thorax is unremarkable. IMPRESSION: Endotracheal and nasogastric tubes have been removed. Left-sided chest tube is unchanged in position without evidence of pneumothorax. Stable left basilar atelectasis is noted with associated pleural effusion. Electronically Signed   By: Lupita Raider, M.D.   On: 04/25/2017 08:56   Dg Chest Port 1 View  Result Date: 04/24/2017 CLINICAL DATA:  Status post CABG. EXAM: PORTABLE CHEST 1 VIEW COMPARISON:  04/23/2017 FINDINGS: 1951 hours. Endotracheal tube tip is 3.7 cm above the base of the carina. Right IJ pulmonary artery catheter tip is in the right main pulmonary artery. Left chest tube evident without features of left pneumothorax. Two mediastinal/pericardial drains overlie the heart. Retrocardiac atelectasis evident with probable tiny left pleural effusion. There is streaky atelectasis in the right lung base. The NG tube passes into the stomach although the distal tip position is not included on the film. IMPRESSION: Status post CABG with apparent appropriate position of support apparatus. Bibasilar atelectasis, left greater than right with tiny left pleural effusion. Electronically Signed   By: Kennith Center M.D.   On: 04/24/2017 20:22     Patient Profile     76 y.o. male coronary artery disease status post multiple PCI procedures presenting with progressive angina found to have severe multivessel disease referred for CABG, multivessel CABG performed 10/29 after Plavix washout  Assessment & Plan  1.  Coronary artery disease with angina: Now postoperative day #2 from CABG.  Appears hemodynamically stable.  Heart rhythm is stable.  No beta-blocker yet because of borderline blood pressure.  2.  Postoperative volume overload: Diuresis per TCTS team  3.  Hyperlipidemia: We discussed his past history of statin intolerance with high intensity statin drugs.  Will increase simvastatin to 40 mg daily as he has tolerated this medication in the past.  4.  Postoperative blood loss anemia: Hemoglobin has slowly trended down, 7.3 mg/dL this morning.  Overall stable from a cardiac perspective with stable heart rhythm.  Will follow from periphery and make sure that he has outpatient cardiology follow-up arranged after discharge with Dr. Wyline Mood.  For questions or updates, please contact [facility] Please consult [email or web address] for contact info under Cardiology/STEMI.      Enzo Bi, MD  04/26/2017, 9:41 AM

## 2017-04-26 NOTE — Plan of Care (Signed)
Problem: Activity: Goal: Risk for activity intolerance will decrease Outcome: Progressing Patient ambulated 3 total laps today. No pain or shortness of breath.  Problem: Bowel/Gastric: Goal: Gastrointestinal status for postoperative course will improve Outcome: Progressing Not passing gas, however active bowel sounds and eating cardiac diet.  Problem: Pain Management: Goal: Pain level will decrease Outcome: Progressing No complaints of pain. Discomfort controlled with scheduled tylenol

## 2017-04-26 NOTE — Progress Notes (Signed)
2 Days Post-Op Procedure(s) (LRB): CORONARY ARTERY BYPASS GRAFTING (CABG)x3 using left internal mammary artery and left greater saphenous vein, bilateral saphenous vein harvested endoscopically (N/A) TRANSESOPHAGEAL ECHOCARDIOGRAM (TEE) (N/A) Subjective: No complaints  Objective: Vital signs in last 24 hours: Temp:  [97.6 F (36.4 C)-98.7 F (37.1 C)] 97.6 F (36.4 C) (10/31 1227) Pulse Rate:  [86-110] 96 (10/31 0700) Cardiac Rhythm: Normal sinus rhythm (10/31 0400) Resp:  [18-32] 20 (10/31 0700) BP: (89-133)/(48-73) 105/65 (10/31 0700) SpO2:  [89 %-99 %] 94 % (10/31 0700) Arterial Line BP: (100-137)/(46-77) 109/46 (10/30 2100) Weight:  [87.2 kg (192 lb 3.2 oz)] 87.2 kg (192 lb 3.2 oz) (10/31 0500)  Hemodynamic parameters for last 24 hours:    Intake/Output from previous day: 10/30 0701 - 10/31 0700 In: 1799.8 [P.O.:880; I.V.:769.8; IV Piggyback:150] Out: 1835 [Urine:1565; Emesis/NG output:100; Chest Tube:170] Intake/Output this shift: No intake/output data recorded.  General appearance: alert and cooperative Heart: regular rate and rhythm, S1, S2 normal, no murmur, click, rub or gallop Lungs: clear to auscultation bilaterally Extremities: edema mild Wound: incisions ok  Lab Results:  Recent Labs  04/25/17 1631 04/25/17 1639 04/26/17 0403  WBC 10.7*  --  9.8  HGB 7.6* 7.8* 7.3*  HCT 23.7* 23.0* 23.2*  PLT 201  --  185   BMET:  Recent Labs  04/25/17 0340  04/25/17 1639 04/26/17 0403  NA 135  --  136 135  K 4.3  --  4.0 3.8  CL 109  --  103 105  CO2 20*  --   --  24  GLUCOSE 149*  --  154* 138*  BUN 12  --  12 9  CREATININE 0.97  < > 0.70 0.94  CALCIUM 7.6*  --   --  8.0*  < > = values in this interval not displayed.  PT/INR:  Recent Labs  04/24/17 1900  LABPROT 17.1*  INR 1.41   ABG    Component Value Date/Time   PHART 7.356 04/25/2017 0128   HCO3 20.8 04/25/2017 0128   TCO2 21 (L) 04/25/2017 1639   ACIDBASEDEF 4.0 (H) 04/25/2017 0128   O2SAT 99.0 04/25/2017 0128   CBG (last 3)   Recent Labs  04/26/17 0337 04/26/17 0759 04/26/17 1226  GLUCAP 128* 125* 119*   CXR: ok  Assessment/Plan: S/P Procedure(s) (LRB): CORONARY ARTERY BYPASS GRAFTING (CABG)x3 using left internal mammary artery and left greater saphenous vein, bilateral saphenous vein harvested endoscopically (N/A) TRANSESOPHAGEAL ECHOCARDIOGRAM (TEE) (N/A)  POD 2 He is hemodynamically stable off neo but BP is still on low normal side so will hold off on Lopressor.  Volume overload. Start diuresis  DM: glucose under good control. Will stop CBG's  DC sleeve and foley  Continue IS and ambulation.   LOS: 7 days    Kevin Reeves 04/26/2017

## 2017-04-26 NOTE — Progress Notes (Signed)
      [address]       [place] [address]             [phone]     POD # 2 CABG  Stable day  No new issues  BP remains relatively low and HR mildly elevated  Beta blocker on hold due to BP  Kevin Reeves Fetch, MD [facility] [phone]

## 2017-04-27 LAB — BPAM RBC
BLOOD PRODUCT EXPIRATION DATE: 201811152359
Blood Product Expiration Date: 201811272359
ISSUE DATE / TIME: 201810291317
ISSUE DATE / TIME: 201810291317
UNIT TYPE AND RH: 5100
Unit Type and Rh: 5100

## 2017-04-27 LAB — TYPE AND SCREEN
ABO/RH(D): O POS
Antibody Screen: NEGATIVE
Unit division: 0
Unit division: 0

## 2017-04-27 MED ORDER — POTASSIUM CHLORIDE CRYS ER 20 MEQ PO TBCR
20.0000 meq | EXTENDED_RELEASE_TABLET | Freq: Two times a day (BID) | ORAL | Status: DC
  Administered 2017-04-27 – 2017-04-29 (×5): 20 meq via ORAL
  Filled 2017-04-27 (×5): qty 1

## 2017-04-27 MED ORDER — FUROSEMIDE 40 MG PO TABS
40.0000 mg | ORAL_TABLET | Freq: Every day | ORAL | Status: AC
  Administered 2017-04-27 – 2017-04-29 (×3): 40 mg via ORAL
  Filled 2017-04-27 (×3): qty 1

## 2017-04-27 NOTE — Progress Notes (Signed)
3 Days Post-Op Procedure(s) (LRB): CORONARY ARTERY BYPASS GRAFTING (CABG)x3 using left internal mammary artery and left greater saphenous vein, bilateral saphenous vein harvested endoscopically (N/A) TRANSESOPHAGEAL ECHOCARDIOGRAM (TEE) (N/A) Subjective: No complaints. Ambulated this am. Passing gas but no BM yet  Objective: Vital signs in last 24 hours: Temp:  [97.6 F (36.4 C)-98.7 F (37.1 C)] 98 F (36.7 C) (11/01 0700) Pulse Rate:  [89-118] 114 (11/01 0700) Cardiac Rhythm: Normal sinus rhythm (11/01 0400) Resp:  [16-26] 25 (11/01 0700) BP: (88-119)/(56-74) 99/60 (11/01 0700) SpO2:  [90 %-99 %] 90 % (11/01 0700) Weight:  [85.8 kg (189 lb 2.5 oz)] 85.8 kg (189 lb 2.5 oz) (11/01 0600)  Hemodynamic parameters for last 24 hours:    Intake/Output from previous day: 10/31 0701 - 11/01 0700 In: 160 [I.V.:160] Out: 2125 [Urine:2125] Intake/Output this shift: No intake/output data recorded.  General appearance: alert and cooperative Heart: regular rate and rhythm, S1, S2 normal, no murmur, click, rub or gallop Lungs: clear to auscultation bilaterally Extremities: edema mild in legs Wound: incisions ok  Lab Results:  Recent Labs  04/25/17 1631 04/25/17 1639 04/26/17 0403  WBC 10.7*  --  9.8  HGB 7.6* 7.8* 7.3*  HCT 23.7* 23.0* 23.2*  PLT 201  --  185   BMET:  Recent Labs  04/25/17 0340  04/25/17 1639 04/26/17 0403  NA 135  --  136 135  K 4.3  --  4.0 3.8  CL 109  --  103 105  CO2 20*  --   --  24  GLUCOSE 149*  --  154* 138*  BUN 12  --  12 9  CREATININE 0.97  < > 0.70 0.94  CALCIUM 7.6*  --   --  8.0*  < > = values in this interval not displayed.  PT/INR:  Recent Labs  04/24/17 1900  LABPROT 17.1*  INR 1.41   ABG    Component Value Date/Time   PHART 7.356 04/25/2017 0128   HCO3 20.8 04/25/2017 0128   TCO2 21 (L) 04/25/2017 1639   ACIDBASEDEF 4.0 (H) 04/25/2017 0128   O2SAT 99.0 04/25/2017 0128   CBG (last 3)   Recent Labs  04/26/17 0337  04/26/17 0759 04/26/17 1226  GLUCAP 128* 125* 119*    Assessment/Plan: S/P Procedure(s) (LRB): CORONARY ARTERY BYPASS GRAFTING (CABG)x3 using left internal mammary artery and left greater saphenous vein, bilateral saphenous vein harvested endoscopically (N/A) TRANSESOPHAGEAL ECHOCARDIOGRAM (TEE) (N/A)  POD 3 He is hemodynamically stable in sinus rhythm. BP is low normal so will hold off on beta blocker for now.  Volume excess: diuresed well yesterday. Still probably 10 lbs over preop. Continue oral lasix and KCL.  Expected acute postop blood loss anemia: on iron and will repeat Hgb tomorrow.  Will continue ASA 81 mg and Plavix 75 mg.  Transfer to 4E   LOS: 8 days    Alleen Borne 04/27/2017

## 2017-04-27 NOTE — Progress Notes (Signed)
Patient ID: Kevin Reeves, male   DOB: Apr 09, 1941, 76 y.o.   MRN: [id number] TCTS evening rounds:  Hemodynamically stable Ambulated  Diuresing well  Waiting for a bed on 4E.

## 2017-04-27 NOTE — Plan of Care (Signed)
Problem: Physical Regulation: Goal: Postoperative complications will be avoided or minimized Outcome: Progressing Progressing well.

## 2017-04-28 LAB — CBC
HCT: 28.4 % — ABNORMAL LOW (ref 39.0–52.0)
HEMOGLOBIN: 8.9 g/dL — AB (ref 13.0–17.0)
MCH: 27.3 pg (ref 26.0–34.0)
MCHC: 31.3 g/dL (ref 30.0–36.0)
MCV: 87.1 fL (ref 78.0–100.0)
Platelets: 332 10*3/uL (ref 150–400)
RBC: 3.26 MIL/uL — AB (ref 4.22–5.81)
RDW: 16.3 % — ABNORMAL HIGH (ref 11.5–15.5)
WBC: 12.3 10*3/uL — ABNORMAL HIGH (ref 4.0–10.5)

## 2017-04-28 LAB — BASIC METABOLIC PANEL
Anion gap: 10 (ref 5–15)
BUN: 17 mg/dL (ref 6–20)
CHLORIDE: 105 mmol/L (ref 101–111)
CO2: 21 mmol/L — AB (ref 22–32)
CREATININE: 1.05 mg/dL (ref 0.61–1.24)
Calcium: 8.7 mg/dL — ABNORMAL LOW (ref 8.9–10.3)
GFR calc Af Amer: >60 mL/min (ref >60)
GFR calc non Af Amer: >60 mL/min (ref >60)
GLUCOSE: 145 mg/dL — AB (ref 65–99)
POTASSIUM: 3.8 mmol/L (ref 3.5–5.1)
Sodium: 136 mmol/L (ref 135–145)

## 2017-04-28 MED ORDER — MOVING RIGHT ALONG BOOK
Freq: Once | Status: AC
  Administered 2017-04-28: 14:00:00
  Filled 2017-04-28: qty 1

## 2017-04-28 MED ORDER — ONDANSETRON HCL 4 MG/2ML IJ SOLN: 4.0000 mg | Freq: Four times a day (QID) | INTRAMUSCULAR | Status: DC | PRN

## 2017-04-28 MED ORDER — PANTOPRAZOLE SODIUM 40 MG PO TBEC
40.0000 mg | DELAYED_RELEASE_TABLET | Freq: Every day | ORAL | Status: DC
  Administered 2017-04-29: 40 mg via ORAL
  Filled 2017-04-28: qty 1

## 2017-04-28 MED ORDER — OXYCODONE HCL 5 MG PO TABS: 5.0000 mg | ORAL_TABLET | ORAL | Status: DC | PRN

## 2017-04-28 MED ORDER — SODIUM CHLORIDE 0.9% FLUSH
3.0000 mL | Freq: Two times a day (BID) | INTRAVENOUS | Status: DC
  Administered 2017-04-28 (×2): 3 mL via INTRAVENOUS

## 2017-04-28 MED ORDER — ACETAMINOPHEN 325 MG PO TABS
650.0000 mg | ORAL_TABLET | Freq: Four times a day (QID) | ORAL | Status: DC | PRN
  Filled 2017-04-28: qty 2

## 2017-04-28 MED ORDER — ONDANSETRON HCL 4 MG PO TABS: 4.0000 mg | ORAL_TABLET | Freq: Four times a day (QID) | ORAL | Status: DC | PRN

## 2017-04-28 MED ORDER — ASPIRIN EC 81 MG PO TBEC
81.0000 mg | DELAYED_RELEASE_TABLET | Freq: Every day | ORAL | Status: DC
  Administered 2017-04-29: 81 mg via ORAL
  Filled 2017-04-28: qty 1

## 2017-04-28 MED ORDER — ENOXAPARIN SODIUM 40 MG/0.4ML ~~LOC~~ SOLN
40.0000 mg | SUBCUTANEOUS | Status: DC
  Administered 2017-04-28: 40 mg via SUBCUTANEOUS
  Filled 2017-04-28: qty 0.4

## 2017-04-28 MED ORDER — BISACODYL 10 MG RE SUPP: 10.0000 mg | Freq: Every day | RECTAL | Status: DC | PRN

## 2017-04-28 MED ORDER — BISACODYL 5 MG PO TBEC: 10.0000 mg | DELAYED_RELEASE_TABLET | Freq: Every day | ORAL | Status: DC | PRN

## 2017-04-28 MED ORDER — CLOPIDOGREL BISULFATE 75 MG PO TABS
75.0000 mg | ORAL_TABLET | Freq: Every day | ORAL | Status: DC
  Administered 2017-04-28 – 2017-04-29 (×2): 75 mg via ORAL
  Filled 2017-04-28 (×2): qty 1

## 2017-04-28 MED ORDER — DOCUSATE SODIUM 100 MG PO CAPS
200.0000 mg | ORAL_CAPSULE | Freq: Every day | ORAL | Status: DC
  Administered 2017-04-28: 200 mg via ORAL
  Filled 2017-04-28 (×2): qty 2

## 2017-04-28 MED ORDER — TRAMADOL HCL 50 MG PO TABS
50.0000 mg | ORAL_TABLET | Freq: Four times a day (QID) | ORAL | Status: DC | PRN
Start: 2017-04-28 — End: 2017-04-29

## 2017-04-28 MED ORDER — SODIUM CHLORIDE 0.9% FLUSH: 3.0000 mL | INTRAVENOUS | Status: DC | PRN

## 2017-04-28 MED ORDER — SODIUM CHLORIDE 0.9 % IV SOLN: 250.0000 mL | INTRAVENOUS | Status: DC | PRN

## 2017-04-28 NOTE — Progress Notes (Signed)
Pt transported to 4E19 via wheelchair accompanied by RN. Transferred to chair independently. Tolerated well. Pt switched to current unit monitor. No s/s of distress at this time.

## 2017-04-28 NOTE — Discharge Summary (Signed)
Physician Discharge Summary  Patient ID: Kevin Reeves MRN: [id number] DOB/AGE: Nov 04, 1940 76 y.o.  Admit date: 04/19/2017 Discharge date: 04/29/2017  Admission Diagnoses: 1. Unstable angina (HCC) 2. History of coronary artery disease and MI-Stent to RI in 2004. Posterior MI in 4/07 with thrombosis of RI at stent site while off Plavix --> reintervention; do not d/c plavix  Active Diagnoses:  1. Hyperlipidemia 2. Hypertension 3. Cerebrovascular disease-Left carotid bruit with 40-59% ICA stenosis; right carotid endarectomy in 2008 4. Cholelithiasis-Asymptomatic 5. Colonic polyp-Adenoma resected in 1999; history of diverticulosis 6. Gastroesophageal reflux disease-Dilation of esophagel stricture in 2008- 7. ABL anemia  Discharged Condition: good  HPI:  The patient is a 76 year old gentleman with hypertension, hyperlipidemia, carotid artery disease s/p right CEA in 2008, and CAD s/p stent to RI in 2004 followed by posterior MI in 09/2005 after plavix stopped due to in stent thrombosis. This was opened and he has been maintained on Plavix since. He now presents with recent exertional shortness of breath which was his prior symptom before his stent. He has the SOB with walking any distance, doing dishes, taking trash out. He denies any chest pain or pressure, shortness of breath, or peripheral edema. He had an echo on 10/19 which showed normal LV systolic function, grade 2 diastolic dysfunction. The aortic valve was moderately calcified but leaflets appear to move fairly well and no gradient measured, trivial AI. Cardiac cath yesterday shows severe 3-vessel CAD as noted below.   Hospital Course:   on 04/24/2017 Kevin Reeves underwent a CABG x 3 with Dr. Laneta Simmers. He tolerated the procedure well and was transferred to the ICU for continued care. He was extubated in a timely manner. POD 1 he remained hemodynamically stable. We were weaning New as tolerated. He did have some expected acute blood loss  anemia which we trended closely. We discontinued his swan ganz catheter. He worked on getting the patient out of bed to the chair. POD 2 he continued to progress. We started a diuretic regimen since he was off Neo for fluid overload. We discontinued his foley catheter. We encouraged mobilization. BP remained loss off drips on POD 3 therefore we continued to hold off on a beta blocker. We continued Lasix for volume overload. He will need Lasix for a short time after discharge. We started Plavix and he remained on ASA. He was stable to transfer to 4E for continued care. POD 4 he remained in the ICU waiting for a bed on 4E. His volume overload was improving with diuretics. Pacing wires and chest tube sutures were removed on 04/28/2017. He continued to improve with his ambulation and incentive spirometer use. He is more tachycardic of late and SBP in the 100's. Will try to restart BB. Hr improved with BB but SBP is in the 90's. As discussed with Dr. Cornelius Moras, will stop BB and try to start after discharge. Also, he has been anemic post op. H and H on 11/03 was 7.1 and 22. He is not symptomatic and has not been transfused. He has been on oral iron as well. He will continue to take oral iron for one month after discharge. Today, he is ambulating with limited assistance, is tolerating room air, his incisions are healing well, and he is ready for discharge.   Significant Diagnostic Studies:   CLINICAL DATA:  Post CABG  EXAM: PORTABLE CHEST 1 VIEW  COMPARISON:  04/25/2017  FINDINGS: Interval removal of Swan-Ganz catheter and left chest tube. No visible pneumothorax. Changes  of CABG. For perihilar and bibasilar airspace opacities are again noted, unchanged. Possible small layering effusions.  IMPRESSION: No significant change in the bilateral perihilar and bibasilar opacities. Suspect small effusions. No pneumothorax.   Electronically Signed   By: Charlett Nose M.D.   On: 04/26/2017  08:32  Treatments:   CARDIOVASCULAR SURGERY OPERATIVE NOTE  04/24/2017  Surgeon:  Kevin Borne, MD  First Assistant: Doree Fudge,  PA-C   Preoperative Diagnosis:  Severe multi-vessel coronary artery disease   Postoperative Diagnosis:  Same   Procedure:  1. Median Sternotomy 2. Extracorporeal circulation 3.   Coronary artery bypass grafting x 3   Left internal mammary graft to the LAD  SVG to Ramus  SVG to RCA  4.   Endoscopic vein harvest from both legs   Anesthesia:  General Endotracheal   Discharge Exam: Blood pressure 95/66, pulse (!) 105, temperature 98.1 F (36.7 C), temperature source Oral, resp. rate (!) 23, height 5\' 8"  (1.727 m), weight 187 lb 4.8 oz (85 kg), SpO2 99 %.   Cardiovascular: Tachycardia Pulmonary: Diminished at bases. Abdomen: Soft, non tender, bowel sounds present. Extremities: Mild bilateral lower extremity edema. Wounds: Clean and dry.  No erythema or signs of infection.   Disposition: 01-Home or Self Care  Discharge Instructions    Amb Referral to Cardiac Rehabilitation    Complete by:  As directed    Diagnosis:  CABG   CABG X ___:  3     Allergies as of 04/29/2017   No Known Allergies     Medication List    STOP taking these medications   metoprolol succinate 25 MG 24 hr tablet Commonly known as:  TOPROL-XL   NITROSTAT 0.4 MG SL tablet Generic drug:  nitroGLYCERIN     TAKE these medications   acetaminophen 325 MG tablet Commonly known as:  TYLENOL Take 2 tablets (650 mg total) by mouth every 6 (six) hours as needed for mild pain.   aspirin EC 81 MG tablet Take 81 mg by mouth every evening.   clopidogrel 75 MG tablet Commonly known as:  PLAVIX Take 75 mg by mouth daily.   ferrous sulfate 325 (65 FE) MG tablet Take 1 tablet (325 mg total) by mouth daily with breakfast. For one month then stop.   furosemide 40 MG tablet Commonly known as:  LASIX Take 1 tablet (40 mg total) by  mouth daily. For 5 days then stop.   Krill Oil 300 MG Caps Take 1 capsule by mouth daily.   multivitamin tablet Take 1 tablet by mouth daily. Start taking on:  05/29/2017 What changed:  These instructions start on 05/29/2017. If you are unsure what to do until then, ask your doctor or other care provider.   omeprazole 20 MG capsule Commonly known as:  PRILOSEC Take 20 mg by mouth daily.   potassium chloride SA 20 MEQ tablet Commonly known as:  K-DUR,KLOR-CON Take 1 tablet (20 mEq total) by mouth daily. For 5 days then stop.   simvastatin 40 MG tablet Commonly known as:  ZOCOR Take 1 tablet (40 mg total) by mouth daily at 6 PM. What changed:  medication strength  how much to take  when to take this   traMADol 50 MG tablet Commonly known as:  ULTRAM Take 1 tablet (50 mg total) by mouth every 6 (six) hours as needed for moderate pain or severe pain.      Follow-up Information    14/08/2016, MD. Call.   Specialty:  Family Medicine Why:  for a follow up appointment regarding further surveillance of HGA1C 5.8 (pre diabetes) Contact information: [address] [place] [address] [phone]        Kevin Borne, MD Follow up.   Specialty:  Cardiothoracic Surgery Why:  Your appointment is on 05/24/2018 at 1:00pm. Please arrive at 12:30pm for a chest xray located at [facility] which is on the first floor of our building.  Contact information: [address] [place] [address] [phone]        Jodelle Gross, NP Follow up.   Specialties:  Nurse Practitioner, Radiology, Cardiology Why:    Joni Reining, NP 11/16 @1 :30 pm ([facility])    Contact information: [address]  [place] [phone]          The patient has been discharged on:   1.Beta Blocker:  Yes [   ]                              No   [ x  ]                              If No, reason: hypotension   2.Ace Inhibitor/ARB:  Yes [   ]                                     No  [  x  ]                                     If No, reason: hypotension  3.Statin:   Yes [ x  ]                  No  [   ]                  If No, reason:  4.Ecasa:  Yes  [ x  ]                  No   [   ]                  If No, reason:   Signed: Tynan Boesel M PA-C 04/29/2017, 11:45 AM

## 2017-04-28 NOTE — Progress Notes (Signed)
RT assessed patient per order, patient is 98% on room air, breath sounds are clear throughout, in no distress, patient is being transferred to 4E19. No respiratory interventions needed at this time. RT will continue to monitor.

## 2017-04-28 NOTE — Progress Notes (Signed)
pts pacer wires and sutures removed at 2:15pm, pt tolerated well. No drainage was noted from the incisions. Vitals were stable throughout. Pt is agreeable to 1 hour bedrest. Will ctm

## 2017-04-28 NOTE — Progress Notes (Signed)
4 Days Post-Op Procedure(s) (LRB): CORONARY ARTERY BYPASS GRAFTING (CABG)x3 using left internal mammary artery and left greater saphenous vein, bilateral saphenous vein harvested endoscopically (N/A) TRANSESOPHAGEAL ECHOCARDIOGRAM (TEE) (N/A) Subjective: No complaints. Ambulating well. Says he is walking better than he was preop with no chest pain or shortness of breath. Bowels working.  Objective: Vital signs in last 24 hours: Temp:  [90.3 F (32.4 C)-98.2 F (36.8 C)] 90.3 F (32.4 C) (11/02 0500) Pulse Rate:  [89-110] 110 (11/01 2034) Cardiac Rhythm: Normal sinus rhythm (11/02 0500) Resp:  [15-26] 21 (11/02 0500) BP: (83-111)/(58-74) 96/58 (11/02 0500) SpO2:  [86 %-99 %] 95 % (11/02 0500) Weight:  [85 kg (187 lb 4.8 oz)] 85 kg (187 lb 4.8 oz) (11/02 0500)  Hemodynamic parameters for last 24 hours:    Intake/Output from previous day: 11/01 0701 - 11/02 0700 In: 603 [P.O.:600; I.V.:3] Out: 1200 [Urine:1200] Intake/Output this shift: No intake/output data recorded.  General appearance: alert and cooperative Neurologic: intact Heart: regular rate and rhythm, S1, S2 normal, no murmur, click, rub or gallop Lungs: clear to auscultation bilaterally Extremities: edema mild edema in lower legs but improved from yesterday Wound: incisions ok  Lab Results:  Recent Labs  04/25/17 1631 04/25/17 1639 04/26/17 0403  WBC 10.7*  --  9.8  HGB 7.6* 7.8* 7.3*  HCT 23.7* 23.0* 23.2*  PLT 201  --  185   BMET:  Recent Labs  04/25/17 1639 04/26/17 0403  NA 136 135  K 4.0 3.8  CL 103 105  CO2  --  24  GLUCOSE 154* 138*  BUN 12 9  CREATININE 0.70 0.94  CALCIUM  --  8.0*    PT/INR: No results for input(s): LABPROT, INR in the last 72 hours. ABG    Component Value Date/Time   PHART 7.356 04/25/2017 0128   HCO3 20.8 04/25/2017 0128   TCO2 21 (L) 04/25/2017 1639   ACIDBASEDEF 4.0 (H) 04/25/2017 0128   O2SAT 99.0 04/25/2017 0128   CBG (last 3)   Recent Labs   04/26/17 0337 04/26/17 0759 04/26/17 1226  GLUCAP 128* 125* 119*    Assessment/Plan: S/P Procedure(s) (LRB): CORONARY ARTERY BYPASS GRAFTING (CABG)x3 using left internal mammary artery and left greater saphenous vein, bilateral saphenous vein harvested endoscopically (N/A) TRANSESOPHAGEAL ECHOCARDIOGRAM (TEE) (N/A)  POD 4  Still waiting on 4E bed  Hemodynamically stable with SBP 100. Hold off on beta blocker for now and will start when he comes back to office.  Expected acute postop blood loss anemia: last Hgb on 10/31 was 7.3 but he is asymptomatic so will continue iron. Repeat in am prior to discharge.  Volume excess: improving but still 8 lbs over preop with some leg edema. Continue daily lasix and KCL.  Continue IS, ambulation  DC pacing wires today  Plan home tomorrow if no changes and labs stable in am.   LOS: 9 days    Alleen Borne 04/28/2017

## 2017-04-28 NOTE — Progress Notes (Signed)
CARDIAC REHAB PHASE I   PRE:  Rate/Rhythm: 102 ST  BP:  Sitting: 105/53      SaO2: 97% RA  MODE:  Ambulation: 470 ft   POST:  Rate/Rhythm: 138 ST    Resting: 96 SR  BP:  Sitting: 128/60      SaO2: 99% RA  Pt ambulated 470 ft at a steady gait. Heart rate was elevated during walk, but pt denied palpitations. Pt had mild SOB when returning to recliner after walk. Pt denied c/o of CP. Ed was completed with wife at beside. Reviewed sternal precautions, IS, restrictions, heart healthy diet, exercise guidelines, and CRPII. Will send referral to [facility] CRPII.   [id number]    York Cerise MS, ACSM CEP  1:40 PM 04/28/2017

## 2017-04-29 LAB — BASIC METABOLIC PANEL
Anion gap: 7 (ref 5–15)
BUN: 15 mg/dL (ref 6–20)
CALCIUM: 8.3 mg/dL — AB (ref 8.9–10.3)
CO2: 25 mmol/L (ref 22–32)
CREATININE: 0.98 mg/dL (ref 0.61–1.24)
Chloride: 104 mmol/L (ref 101–111)
GFR calc Af Amer: >60 mL/min (ref >60)
GLUCOSE: 103 mg/dL — AB (ref 65–99)
Potassium: 4 mmol/L (ref 3.5–5.1)
Sodium: 136 mmol/L (ref 135–145)

## 2017-04-29 LAB — CBC
HCT: 22 % — ABNORMAL LOW (ref 39.0–52.0)
Hemoglobin: 7.1 g/dL — ABNORMAL LOW (ref 13.0–17.0)
MCH: 27.4 pg (ref 26.0–34.0)
MCHC: 32.3 g/dL (ref 30.0–36.0)
MCV: 84.9 fL (ref 78.0–100.0)
PLATELETS: 352 10*3/uL (ref 150–400)
RBC: 2.59 MIL/uL — ABNORMAL LOW (ref 4.22–5.81)
RDW: 16.6 % — AB (ref 11.5–15.5)
WBC: 9.5 10*3/uL (ref 4.0–10.5)

## 2017-04-29 MED ORDER — SIMVASTATIN 40 MG PO TABS: 40.0000 mg | ORAL_TABLET | Freq: Every day | ORAL | 1 refills | Status: DC

## 2017-04-29 MED ORDER — TRAMADOL HCL 50 MG PO TABS: 50.0000 mg | ORAL_TABLET | Freq: Four times a day (QID) | ORAL | 0 refills | Status: DC | PRN

## 2017-04-29 MED ORDER — METOPROLOL TARTRATE 12.5 MG HALF TABLET
12.5000 mg | ORAL_TABLET | Freq: Two times a day (BID) | ORAL | Status: DC
  Administered 2017-04-29: 12.5 mg via ORAL
  Filled 2017-04-29: qty 1

## 2017-04-29 MED ORDER — ONE-DAILY MULTI VITAMINS PO TABS: 1.0000 | ORAL_TABLET | Freq: Every day | ORAL | Status: AC

## 2017-04-29 MED ORDER — FUROSEMIDE 40 MG PO TABS: 40.0000 mg | ORAL_TABLET | Freq: Every day | ORAL | 0 refills | Status: DC

## 2017-04-29 MED ORDER — POTASSIUM CHLORIDE CRYS ER 20 MEQ PO TBCR: 20.0000 meq | EXTENDED_RELEASE_TABLET | Freq: Every day | ORAL | 0 refills | Status: DC

## 2017-04-29 MED ORDER — ACETAMINOPHEN 325 MG PO TABS: 650.0000 mg | ORAL_TABLET | Freq: Four times a day (QID) | ORAL | Status: DC | PRN

## 2017-04-29 MED ORDER — FERROUS SULFATE 325 (65 FE) MG PO TABS: 325.0000 mg | ORAL_TABLET | Freq: Every day | ORAL | 3 refills | Status: DC

## 2017-04-29 NOTE — Progress Notes (Signed)
Spoke w patient and wife, they decline RW, state they will get one from neighbor at home. No other CM needs identified. CM signing off.

## 2017-04-29 NOTE — Discharge Instructions (Signed)

## 2017-04-29 NOTE — Plan of Care (Signed)
Problem: Education: Goal: Knowledge of [facility] Education information/materials will improve Outcome: Progressing POC reviewed with pt.   

## 2017-04-29 NOTE — Progress Notes (Addendum)
      [address]       [place] [address]             [phone]        5 Days Post-Op Procedure(s) (LRB): CORONARY ARTERY BYPASS GRAFTING (CABG)x3 using left internal mammary artery and left greater saphenous vein, bilateral saphenous vein harvested endoscopically (N/A) TRANSESOPHAGEAL ECHOCARDIOGRAM (TEE) (N/A)  Subjective: Patient eating breakfast and he has no complaints.  Objective: Vital signs in last 24 hours: Temp:  [97.5 F (36.4 C)-98.7 F (37.1 C)] 98.1 F (36.7 C) (11/03 0648) Pulse Rate:  [99-130] 102 (11/03 0648) Cardiac Rhythm: Normal sinus rhythm (11/02 2002) Resp:  [17-28] 28 (11/03 0648) BP: (95-128)/(55-90) 118/63 (11/03 0648) SpO2:  [93 %-100 %] 100 % (11/03 0648)  Pre op weight 81.2 kg Current Weight  04/28/17 187 lb 4.8 oz (85 kg)      Intake/Output from previous day: 11/02 0701 - 11/03 0700 In: 480 [P.O.:480] Out: -    Physical Exam:  Cardiovascular: Tachycardia Pulmonary: Diminished at bases. Abdomen: Soft, non tender, bowel sounds present. Extremities: Mild bilateral lower extremity edema. Wounds: Clean and dry.  No erythema or signs of infection.  Lab Results: CBC: Recent Labs  04/28/17 1103 04/29/17 0419  WBC 12.3* 9.5  HGB 8.9* 7.1*  HCT 28.4* 22.0*  PLT 332 352   BMET:  Recent Labs  04/28/17 1103 04/29/17 0419  NA 136 136  K 3.8 4.0  CL 105 104  CO2 21* 25  GLUCOSE 145* 103*  BUN 17 15  CREATININE 1.05 0.98  CALCIUM 8.7* 8.3*    PT/INR:  Lab Results  Component Value Date   INR 1.41 04/24/2017   INR 1.07 04/19/2017   INR 1.41 05/13/2014   ABG:  INR: Will add last result for INR, ABG once components are confirmed Will add last 4 CBG results once components are confirmed  Assessment/Plan:  1. CV - Had run of ST, PVCs. On Plavix 75 mg daily. Not on BB and BP slightly improved so will start. 2.  Pulmonary - On room air. Encourage incentive spirometer. 3. Volume Overload - On Lasix 40  mg daily. 4.  Acute blood loss anemia - H and H decreased to 7.1 and 22. No obvious blood. H and H on 10/31/ was 7.3 and 23.2. He is not symptomatic. On Fergon. 5. As discussed with Dr. Cornelius Moras, will discharge later today.  ZIMMERMAN,DONIELLE MPA-C 04/29/2017,7:37 AM

## 2017-05-11 NOTE — Progress Notes (Signed)
Cardiology Office Note   Date:  05/12/2017   ID:  Kevin Reeves, DOB December 12, 1940, MRN [id number]  PCP:  Gareth Morgan, MD  Cardiologist:Jonathan Wyline Mood, MD     Chief Complaint  Patient presents with  . Coronary Artery Disease  . Hypertension  . Congestive Heart Failure      History of Present Illness: Kevin Reeves is a 76 y.o. male who presents for post hospital follow up after admission for unstable angina, requiring cardiac cath. He has a known history of ICM, chronic systolic CHF, HTN,, carotid artery stenosis, and hyperlipidemia.   He underwent a cardiac catheterization on 04/19/2017 which revealed severe triple vessel disease. He was seen by CVTS and was felt to be a candidate for CABG. On 04/24/2017 he underwent 3 vessel CABG ( LIMA to LAD, SVG to Ramus, SVG to RCA.   He was started on Plavix, ASA, and was continued on lasix for short term post hospitalization. BB was not started due to hypotension. He is here for cardiology follow up after discharge on 04/28/2017.  He is here today without any complaints.  He has some soreness from his sternotomy incision site but otherwise is doing okay.  He is slowly beginning to get his energy back.  He is no longer taking Lasix.  He is interested in cardiac rehab, and is been advised that they will be calling him within the next couple of weeks.  He denies any bleeding, melena, or hemoptysis on Plavix and aspirin.   Past Medical History:  Diagnosis Date  . Arteriosclerotic cardiovascular disease (ASCVD)    Stent to RI in 2004. Posterior MI in 4/07 with thrombosis of RI at stent site while off Plavix --> reintervention; do not d/c plavix  . Arthritis   . Cerebrovascular disease    Left carotid bruit with 40-59% ICA stenosis; right carotid endarectomy in 2008  . Cholelithiasis 07/21/2009   Asymptomatic  . Colonic polyp 1999   Adenoma resected in 1999; history of diverticulosis  . Coronary artery disease   . Gastroesophageal reflux  disease    Dilation of esophagel stricture in 2008  . Hyperlipidemia   . Hypertension   . Myocardial infarction [facility]) 09/2005    Past Surgical History:  Procedure Laterality Date  . CARDIAC CATHETERIZATION     cardiac stent placed  . CAROTID ENDARTERECTOMY  2008   Right  . COLONOSCOPY  2008   adenoma resected in 1999; no intervention in 2008  . COLONOSCOPY WITH ESOPHAGOGASTRODUODENOSCOPY (EGD)  Feb 2010   Dr. Jena Gauss: mild erosive reflux esophagitis, non-critical Schatzki's ring without dilation, moderate sized hiatal hernia, bulbar erosions. Negative H.pylori serology. Colonoscopy with normal rectum, pancolonic diverticula, needs surveillance every 5 years due to history of adenomas.   . CORONARY ARTERY BYPASS GRAFTING (CABG)x3 using left internal mammary artery and left greater saphenous vein, bilateral saphenous vein harvested endoscopically N/A 04/24/2017   Performed by Alleen Borne, MD at [facility] OR  . LAPAROSCOPIC CHOLECYSTECTOMY N/A 03/28/2014   Performed by Dalia Heading, MD at [facility] ORS  . LEFT HEART CATH AND CORONARY ANGIOGRAPHY N/A 04/19/2017   Performed by Kathleene Hazel, MD at [facility] INVASIVE CV LAB  . TRANSESOPHAGEAL ECHOCARDIOGRAM (TEE) N/A 04/24/2017   Performed by Alleen Borne, MD at [facility] OR     Current Outpatient Medications  Medication Sig Dispense Refill  . acetaminophen (TYLENOL) 325 MG tablet Take 2 tablets (650 mg total) by mouth every 6 (six) hours as needed for mild pain.    Marland Kitchen  aspirin EC 81 MG tablet Take 81 mg by mouth every evening.     . clopidogrel (PLAVIX) 75 MG tablet Take 75 mg by mouth daily.    . ferrous sulfate 325 (65 FE) MG tablet Take 1 tablet (325 mg total) by mouth daily with breakfast. For one month then stop. 30 tablet 3  . Krill Oil 300 MG CAPS Take 1 capsule by mouth daily.     Melene Muller ON 05/29/2017] Multiple Vitamin (MULTIVITAMIN) tablet Take 1 tablet by mouth daily.    Marland Kitchen omeprazole (PRILOSEC) 20 MG capsule Take 20 mg by mouth daily.      . simvastatin (ZOCOR) 40 MG tablet Take 1 tablet (40 mg total) by mouth daily at 6 PM. 30 tablet 1   No current facility-administered medications for this visit.     Allergies:   Patient has no known allergies.    Social History:  The patient  reports that  has never smoked. he has never used smokeless tobacco. He reports that he does not drink alcohol or use drugs.   Family History:  The patient's family history includes Heart attack in his brother, brother, and other; Heart block in his unknown relative; Heart disease in his brother, brother, father, paternal grandfather, and paternal grandmother; Hyperlipidemia in his brother and father; Hypertension in his brother and father; Stroke in his other.    ROS: All other systems are reviewed and negative. Unless otherwise mentioned in H&P    PHYSICAL EXAM: VS:  BP (!) 100/58   Ht 5\' 8"  (1.727 m)   Wt 183 lb (83 kg)   BMI 27.83 kg/m  , BMI Body mass index is 27.83 kg/m. GEN: Well nourished, well developed, in no acute distress  HEENT: normal  Neck: no JVD, carotid bruits, or masses Cardiac: RRR; no murmurs, rubs, or gallops,no edema midline sternotomy site well-healed. Respiratory:  Clear to auscultation bilaterally, normal work of breathing GI: soft, nontender, nondistended, + BS MS: no deformity or atrophy saphenous vein harvest sites in bilateral lower extremities are healing well without evidence of infection, bleeding, or drainage. Skin: warm and dry, no rash Neuro:  Strength and sensation are intact. Psych: euthymic mood, full affect   Recent Labs: 04/25/2017: Magnesium 2.3 04/29/2017: BUN 15; Creatinine, Ser 0.98; Hemoglobin 7.1; Platelets 352; Potassium 4.0; Sodium 136    Lipid Panel    Component Value Date/Time   CHOL 194 01/27/2015 0926   TRIG 131 01/27/2015 0926   HDL 35 (L) 01/27/2015 0926   CHOLHDL 5.5 (H) 01/27/2015 0926   VLDL 26 01/27/2015 0926   LDLCALC 133 (H) 01/27/2015 0926      Wt Readings from  Last 3 Encounters:  05/12/17 183 lb (83 kg)  04/28/17 187 lb 4.8 oz (85 kg)  04/13/17 184 lb (83.5 kg)      Other studies Reviewed: Procedures   LEFT HEART CATH AND CORONARY ANGIOGRAPHY  Conclusion     Mid RCA lesion, 80 %stenosed.  Dist RCA lesion, 60 %stenosed.  Ost Cx to Prox Cx lesion, 99 %stenosed.  Ramus-2 lesion, 95 %stenosed.  Ramus-1 lesion, 10 %stenosed.  Ost 1st Mrg to 1st Mrg lesion, 80 %stenosed.  Ost 2nd Mrg to 2nd Mrg lesion, 90 %stenosed.  Ost 2nd Diag to 2nd Diag lesion, 99 %stenosed.  Prox LAD to Mid LAD lesion, 90 %stenosed.  Mid LAD lesion, 80 %stenosed.  Dist LAD lesion, 70 %stenosed.  The left ventricular systolic function is normal.  LV end diastolic pressure is normal.  The left ventricular ejection fraction is 50-55% by visual estimate.  There is no mitral valve regurgitation.   1. Severe triple vessel CAD 2. Preserved LV systolic function    ASSESSMENT AND PLAN:  1.  Coronary artery disease: Status post 3 vessel coronary artery bypass grafting.  He is recovering well.  He offers no new complaints.  He continues on current medication regimen.  Heart rate is in the 70s.  I am not going to add beta-blocker at this time due to soft blood pressure 100/58.  Once he becomes more active may need to consider adding back low-dose beta-blocker.  2.  Anemia: Hemoglobin on discharge was 7.7.  Will repeat CBC for reevaluation and status.  3.  History of hypertension: He is not on any antihypertensive medications.  In fact he is hypotensive today.  Checking CBC  4.  Hypercholesterolemia: Continue simvastatin 40 mg in the evening.   Current medicines are reviewed at length with the patient today.    Labs/ tests ordered today include:  Bettey Mare. Liborio Nixon, ANP, AACC   05/12/2017 2:03 PM    [facility] [address] [place], [place] [address] Phone: [phone]; Fax: [phone]

## 2017-05-12 ENCOUNTER — Other Ambulatory Visit (HOSPITAL_COMMUNITY)
Admission: RE | Admit: 2017-05-12 | Discharge: 2017-05-12 | Disposition: A | Discharge status: Home / Self Care | Payer: Medicare HMO | Source: Ambulatory Visit | Attending: Adult Health | Admitting: Adult Health

## 2017-05-12 ENCOUNTER — Ambulatory Visit: Payer: Medicare HMO | Admitting: Adult Health

## 2017-05-12 ENCOUNTER — Encounter: Payer: Self-pay | Admitting: Adult Health

## 2017-05-12 VITALS — BP 100/58 | Ht 68.0 in | Wt 183.0 lb

## 2017-05-12 DIAGNOSIS — I10 Essential (primary) hypertension: Secondary | ICD-10-CM | POA: Diagnosis not present

## 2017-05-12 DIAGNOSIS — I2510 Atherosclerotic heart disease of native coronary artery without angina pectoris: Secondary | ICD-10-CM

## 2017-05-12 DIAGNOSIS — D508 Other iron deficiency anemias: Secondary | ICD-10-CM | POA: Insufficient documentation

## 2017-05-12 LAB — CBC WITH DIFFERENTIAL/PLATELET
BASOS ABS: 0 10*3/uL (ref 0.0–0.1)
Basophils Relative: 0 %
EOS ABS: 0.8 10*3/uL — AB (ref 0.0–0.7)
EOS PCT: 9 %
HCT: 33.6 % — ABNORMAL LOW (ref 39.0–52.0)
HEMOGLOBIN: 10 g/dL — AB (ref 13.0–17.0)
LYMPHS PCT: 31 %
Lymphs Abs: 2.7 10*3/uL (ref 0.7–4.0)
MCH: 27.3 pg (ref 26.0–34.0)
MCHC: 29.8 g/dL — ABNORMAL LOW (ref 30.0–36.0)
MCV: 91.8 fL (ref 78.0–100.0)
Monocytes Absolute: 1.2 10*3/uL — ABNORMAL HIGH (ref 0.1–1.0)
Monocytes Relative: 14 %
NEUTROS PCT: 46 %
Neutro Abs: 3.9 10*3/uL (ref 1.7–7.7)
PLATELETS: 675 10*3/uL — AB (ref 150–400)
RBC: 3.66 MIL/uL — AB (ref 4.22–5.81)
RDW: 17.4 % — ABNORMAL HIGH (ref 11.5–15.5)
WBC: 8.6 10*3/uL (ref 4.0–10.5)

## 2017-05-12 NOTE — Patient Instructions (Addendum)
Medication Instructions:  Your physician recommends that you continue on your current medications as directed. Please refer to the Current Medication list given to you today.   Labwork: Your physician recommends that you return for lab work in: Today    Testing/Procedures: NONE   Follow-Up: NONE   Any Other Special Instructions Will Be Listed Below (If Applicable).     If you need a refill on your cardiac medications before your next appointment, please call your pharmacy.  Thank you for choosing [facility]!

## 2017-05-12 NOTE — Addendum Note (Signed)
Addended by: Kerney Elbe on: 05/12/2017 02:15 PM   Modules accepted: Orders

## 2017-05-24 ENCOUNTER — Ambulatory Visit: Payer: Medicare HMO | Admitting: Surgery

## 2017-05-26 ENCOUNTER — Other Ambulatory Visit: Payer: Self-pay | Admitting: Surgery

## 2017-05-26 DIAGNOSIS — I25110 Atherosclerotic heart disease of native coronary artery with unstable angina pectoris: Secondary | ICD-10-CM

## 2017-05-29 ENCOUNTER — Ambulatory Visit (INDEPENDENT_AMBULATORY_CARE_PROVIDER_SITE_OTHER): Payer: Self-pay | Admitting: Surgical

## 2017-05-29 ENCOUNTER — Ambulatory Visit
Admission: RE | Admit: 2017-05-29 | Discharge: 2017-05-29 | Disposition: A | Discharge status: Home / Self Care | Payer: Medicare HMO | Source: Ambulatory Visit | Attending: Surgery | Admitting: Surgery

## 2017-05-29 ENCOUNTER — Other Ambulatory Visit: Payer: Self-pay

## 2017-05-29 VITALS — BP 118/78 | HR 98 | Ht 68.0 in | Wt 178.0 lb

## 2017-05-29 DIAGNOSIS — I25110 Atherosclerotic heart disease of native coronary artery with unstable angina pectoris: Secondary | ICD-10-CM

## 2017-05-29 DIAGNOSIS — Z951 Presence of aortocoronary bypass graft: Secondary | ICD-10-CM

## 2017-05-29 DIAGNOSIS — J9811 Atelectasis: Secondary | ICD-10-CM | POA: Diagnosis not present

## 2017-05-29 NOTE — Assessment & Plan Note (Signed)
Excellent surgical recovery following CABG

## 2017-05-29 NOTE — Patient Instructions (Signed)
Discussed activity progression and driving protocols. 

## 2017-05-29 NOTE — Progress Notes (Signed)
[address]       [place] [address]             [phone]      Kevin Reeves [facility] Medical Record #[id number] Date of Birth: Jul 23, 1940  Referring: Kathleene Hazel* Primary Care: Gareth Morgan, MD  Chief Complaint:   POST OP FOLLOW UP                                                                                           CARDIOVASCULAR SURGERY OPERATIVE NOTE  04/24/2017  Surgeon:  Alleen Borne, MD  First Assistant: Doree Fudge,  PA-C   Preoperative Diagnosis:  Severe multi-vessel coronary artery disease   Postoperative Diagnosis:  Same   Procedure:  1. Median Sternotomy 2. Extracorporeal circulation 3.   Coronary artery bypass grafting x 3   Left internal mammary graft to the LAD  SVG to Ramus  SVG to RCA  4.   Endoscopic vein harvest from both legs   Anesthesia:  General Endotracheal    History of Present Illness:    The patient is a 76 year old male status post the above described procedures seen in the office on today's date and routine postsurgical follow-up visit.  He reports that he is doing quite well.  He denies any fevers, chills or recent constitutional symptoms.  He denies shortness of breath or chest pain.  He stopped taking pain medication 3 weeks ago.  He is ambulating well and his endurance is creasing.  He has had no difficulties with his incisions.  He has some mild lower extremity edema which has been stable over time.  Overall he feels quite well and is pleased with his progress.      Past Medical History:  Diagnosis Date  . Arteriosclerotic cardiovascular disease (ASCVD)    Stent to RI in 2004. Posterior MI in 4/07 with thrombosis of RI at stent site while off Plavix --> reintervention; do not d/c plavix  . Arthritis   . Cerebrovascular disease    Left carotid bruit with 40-59% ICA stenosis; right carotid endarectomy in 2008  . Cholelithiasis 07/21/2009   Asymptomatic  . Colonic polyp 1999   Adenoma resected in 1999; history of diverticulosis  . Coronary artery disease   . Gastroesophageal reflux disease    Dilation of esophagel stricture in 2008  . Hyperlipidemia   . Hypertension   . Myocardial infarction [facility]) 09/2005     Social History   Tobacco Use  Smoking Status Never Smoker  Smokeless Tobacco Never Used    Social History   Substance and Sexual Activity  Alcohol Use No  . Alcohol/week: 0.0 oz     No Known Allergies  Current Outpatient Medications  Medication Sig Dispense Refill  . acetaminophen (TYLENOL) 325 MG tablet Take 2 tablets (650 mg total) by mouth every 6 (six) hours as needed for mild pain.    Marland Kitchen aspirin EC 81 MG tablet Take 81 mg by mouth every evening.     . clopidogrel (PLAVIX) 75 MG tablet Take 75 mg by mouth daily.    . ferrous  sulfate 325 (65 FE) MG tablet Take 1 tablet (325 mg total) by mouth daily with breakfast. For one month then stop. 30 tablet 3  . Krill Oil 300 MG CAPS Take 1 capsule by mouth daily.     . Multiple Vitamin (MULTIVITAMIN) tablet Take 1 tablet by mouth daily.    Marland Kitchen omeprazole (PRILOSEC) 20 MG capsule Take 20 mg by mouth daily.    . simvastatin (ZOCOR) 40 MG tablet Take 1 tablet (40 mg total) by mouth daily at 6 PM. 30 tablet 1   No current facility-administered medications for this visit.        Physical Exam: BP 118/78   Pulse 98   Ht 5\' 8"  (1.727 m)   Wt 178 lb (80.7 kg)   SpO2 99%   BMI 27.06 kg/m   General appearance: alert, cooperative and no distress Heart: regular rate and rhythm Lungs: clear to auscultation bilaterally Abdomen: soft, non-tender; bowel sounds normal; no masses,  no organomegaly Extremities: min edema Wound: incis healing well without evidence of infection.   Diagnostic Studies & Laboratory data:     Recent Radiology Findings:   Dg Chest 2 View  Result Date: 05/29/2017 CLINICAL DATA:  CABG .  Coronary artery disease EXAM: CHEST  2 VIEW  COMPARISON:  04/26/2017 FINDINGS: Improved aeration of the lung bases with decreased atelectasis and pleural effusions. Small pleural effusions are present bilaterally. Negative for heart failure or pneumonia. IMPRESSION: Improvement in bibasilar atelectasis and effusion. Small residual effusions. Electronically Signed   By: 04/28/2017 M.D.   On: 05/29/2017 15:13      Recent Lab Findings: Lab Results  Component Value Date   WBC 8.6 05/12/2017   HGB 10.0 (L) 05/12/2017   HCT 33.6 (L) 05/12/2017   PLT 675 (H) 05/12/2017   GLUCOSE 103 (H) 04/29/2017   CHOL 194 01/27/2015   TRIG 131 01/27/2015   HDL 35 (L) 01/27/2015   LDLCALC 133 (H) 01/27/2015   ALT 16 05/11/2016   AST 19 05/11/2016   NA 136 04/29/2017   K 4.0 04/29/2017   CL 104 04/29/2017   CREATININE 0.98 04/29/2017   BUN 15 04/29/2017   CO2 25 04/29/2017   TSH 4.634 (H) 08/21/2012   INR 1.41 04/24/2017   HGBA1C 5.8 (H) 04/25/2017      Assessment / Plan: The patient is doing quite well.  There are no current ongoing surgically related issues except routine recovery.  We discussed activity progression and driving.  He understands the protocol.  We also discussed heart healthy nutrition and exercise.  He appears to be motivated to make improvements.  We will see him again on a as needed basis for any surgically related needs or at her request.          04/27/2017, PA-C 05/29/2017 3:36 PM

## 2017-05-30 ENCOUNTER — Encounter (HOSPITAL_COMMUNITY): Payer: Self-pay | Admitting: *Deleted

## 2017-06-07 ENCOUNTER — Encounter: Payer: Self-pay | Admitting: Family

## 2017-06-07 ENCOUNTER — Ambulatory Visit (INDEPENDENT_AMBULATORY_CARE_PROVIDER_SITE_OTHER)
Admission: RE | Admit: 2017-06-07 | Discharge: 2017-06-07 | Disposition: A | Discharge status: Home / Self Care | Payer: Medicare HMO | Source: Ambulatory Visit | Attending: Vascular Surgery | Admitting: Vascular Surgery

## 2017-06-07 ENCOUNTER — Ambulatory Visit (HOSPITAL_COMMUNITY)
Admission: RE | Admit: 2017-06-07 | Discharge: 2017-06-07 | Disposition: A | Discharge status: Home / Self Care | Payer: Medicare HMO | Source: Ambulatory Visit | Attending: Vascular Surgery | Admitting: Vascular Surgery

## 2017-06-07 ENCOUNTER — Ambulatory Visit: Payer: Medicare HMO | Admitting: Family

## 2017-06-07 VITALS — BP 107/70 | HR 90 | Temp 96.7°F | Resp 17 | Wt 180.6 lb

## 2017-06-07 DIAGNOSIS — I779 Disorder of arteries and arterioles, unspecified: Secondary | ICD-10-CM

## 2017-06-07 DIAGNOSIS — Z9889 Other specified postprocedural states: Secondary | ICD-10-CM

## 2017-06-07 DIAGNOSIS — I6523 Occlusion and stenosis of bilateral carotid arteries: Secondary | ICD-10-CM

## 2017-06-07 LAB — VAS US CAROTID
LCCADDIAS: -24 cm/s
LCCADSYS: -84 cm/s
LEFT ECA DIAS: -30 cm/s
LEFT VERTEBRAL DIAS: -20 cm/s
LICADDIAS: -28 cm/s
LICADSYS: -98 cm/s
LICAPDIAS: 60 cm/s
LICAPSYS: 183 cm/s
Left CCA prox dias: 16 cm/s
Left CCA prox sys: 79 cm/s
RCCADSYS: -90 cm/s
RIGHT CCA MID DIAS: -16 cm/s
RIGHT ECA DIAS: -8 cm/s
RIGHT VERTEBRAL DIAS: -14 cm/s
Right CCA prox dias: 12 cm/s
Right CCA prox sys: 60 cm/s

## 2017-06-07 NOTE — Progress Notes (Signed)
[facility] [place] HISTORY AND PHYSICAL   CC: Follow up extracranial carotid artery stenosis and peripheral artery occlusive disease   History of Present Illness:   Kevin Reeves is a 76 y.o. male who is status post right CEA in October 2007 by Dr. Hart Rochester and returns today for follow up.  The patient has no history of TIA or stroke symptoms, specifically he denies a history of amaurosis fugax or monocular blindness, unilateralfacial drooping, denies a history of hemiplegia, and denies a history of receptive or expressive aphasia.   He denies claudication symptoms in legs with walking.  He had 3 vessel CABG in October 2018. He was having dyspnea and had a cardiac cath.   Pt and wife states he has worsening trouble walking, states he pain in his joints, denies radiculopathy type pain, this is under evaluation per his wife. Cholecystectomy, March 28, 2014.  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 acetaminophen (TYLENOL) 325 MG tablet Take 2 tablets (650 mg total) by mouth every 6 (six) hours as needed for mild pain.    Marland Kitchen aspirin EC 81 MG tablet Take 81 mg by mouth every evening.     . clopidogrel (PLAVIX) 75 MG tablet Take 75 mg by mouth daily.    Boris Lown Oil 300 MG CAPS Take 1 capsule by mouth daily.     . Multiple Vitamin (MULTIVITAMIN) tablet Take 1 tablet by mouth daily.    Marland Kitchen omeprazole (PRILOSEC) 20 MG capsule Take 20 mg by mouth daily.    . simvastatin (ZOCOR) 40 MG tablet Take 1 tablet (40 mg total) by mouth daily at 6 PM. 30 tablet 1   No current facility-administered medications for this visit.     Past Medical History:  Diagnosis Date  . Arteriosclerotic cardiovascular disease (ASCVD)    Stent to RI in 2004.  Posterior MI in 4/07 with thrombosis of RI at stent site while off Plavix --> reintervention; do not d/c plavix  . Arthritis   . Cerebrovascular disease    Left carotid bruit with 40-59% ICA stenosis; right carotid endarectomy in 2008  . Cholelithiasis 07/21/2009   Asymptomatic  . Colonic polyp 1999   Adenoma resected in 1999; history of diverticulosis  . Coronary artery disease   . Gastroesophageal reflux disease    Dilation of esophagel stricture in 2008  . Hyperlipidemia   . Hypertension   . Myocardial infarction [facility]) 09/2005    Social History Social History   Tobacco Use  . Smoking status: Never Smoker  . Smokeless tobacco: Never Used  Substance Use Topics  . Alcohol use: No    Alcohol/week: 0.0 oz  . Drug use: No    Family History Family History  Problem Relation Age of Onset  . Heart disease Father        Before age 55  . Hyperlipidemia Father   . Hypertension Father   . Heart disease Brother   . Heart attack Brother   . Heart disease Brother        Before age 12-  Quad-BPG  . Hyperlipidemia Brother   . Hypertension Brother   . Heart attack Brother   . Stroke Other        Grandparent  . Heart attack Other  Grandparent  . Heart block Unknown        Brother  . Heart disease Paternal Grandmother   . Heart disease Paternal Grandfather   . Colon cancer Neg Hx     Surgical History Past Surgical History:  Procedure Laterality Date  . CARDIAC CATHETERIZATION     cardiac stent placed  . CAROTID ENDARTERECTOMY  2008   Right  . CHOLECYSTECTOMY N/A 03/28/2014   Procedure: LAPAROSCOPIC CHOLECYSTECTOMY;  Surgeon: Dalia Heading, MD;  Location: [facility];  Service: General;  Laterality: N/A;  . COLONOSCOPY  2008   adenoma resected in 1999; no intervention in 2008  . COLONOSCOPY WITH ESOPHAGOGASTRODUODENOSCOPY (EGD)  Feb 2010   Dr. Jena Gauss: mild erosive reflux esophagitis, non-critical Schatzki's ring without dilation, moderate sized hiatal hernia, bulbar  erosions. Negative H.pylori serology. Colonoscopy with normal rectum, pancolonic diverticula, needs surveillance every 5 years due to history of adenomas.   . CORONARY ARTERY BYPASS GRAFT N/A 04/24/2017   Procedure: CORONARY ARTERY BYPASS GRAFTING (CABG)x3 using left internal mammary artery and left greater saphenous vein, bilateral saphenous vein harvested endoscopically;  Surgeon: Alleen Borne, MD;  Location: [facility] OR;  Service: Open Heart Surgery;  Laterality: N/A;  . LEFT HEART CATH AND CORONARY ANGIOGRAPHY N/A 04/19/2017   Procedure: LEFT HEART CATH AND CORONARY ANGIOGRAPHY;  Surgeon: Kathleene Hazel, MD;  Location: [facility] INVASIVE CV LAB;  Service: Cardiovascular;  Laterality: N/A;  . TEE WITHOUT CARDIOVERSION N/A 04/24/2017   Procedure: TRANSESOPHAGEAL ECHOCARDIOGRAM (TEE);  Surgeon: Alleen Borne, MD;  Location: [facility];  Service: Open Heart Surgery;  Laterality: N/A;    No Known Allergies  Current Outpatient Medications  Medication Sig Dispense Refill  . acetaminophen (TYLENOL) 325 MG tablet Take 2 tablets (650 mg total) by mouth every 6 (six) hours as needed for mild pain.    Marland Kitchen aspirin EC 81 MG tablet Take 81 mg by mouth every evening.     . clopidogrel (PLAVIX) 75 MG tablet Take 75 mg by mouth daily.    Boris Lown Oil 300 MG CAPS Take 1 capsule by mouth daily.     . Multiple Vitamin (MULTIVITAMIN) tablet Take 1 tablet by mouth daily.    Marland Kitchen omeprazole (PRILOSEC) 20 MG capsule Take 20 mg by mouth daily.    . simvastatin (ZOCOR) 40 MG tablet Take 1 tablet (40 mg total) by mouth daily at 6 PM. 30 tablet 1   No current facility-administered medications for this visit.      REVIEW OF SYSTEMS: See HPI for pertinent positives and negatives.  Physical Examination Vitals:   06/07/17 1407 06/07/17 1409  BP: 93/65 107/70  Pulse: 90   Resp: 17   Temp: (!) 96.7 F (35.9 C)   TempSrc: Oral   SpO2: 100%   Weight: 180 lb 9.6 oz (81.9 kg)    Body mass index is 27.46 kg/m.  General:   WDWN male in NAD GAIT:stiff, slow Eyes: PERRLA Pulmonary: Respirations are non-labored, CTAB, no rales, rhonchi, or wheezing.  Cardiac: regular rhythm,no detected murmur.  VASCULAR EXAM Carotid Bruits Right Left   Negative Negative   Radial pulses are 2+ palpable and equal. Abdominal aortic pulse is not palpable      LE Pulses Right Left   POPLITEAL not palpable  not palpable   POSTERIOR TIBIAL not palpable  not palpable    DORSALIS PEDIS  ANTERIOR TIBIAL not palpable  not palpable     Gastrointestinal: soft, nontender, BS WNL, no r/g, no masses palpated.  Musculoskeletal: No muscle atrophy/wasting. M/S 5/5 throughout, extremities without ischemic changes. Feet are warm, pink, with brisk capillary refill.  Skin: No rash, no cellulitis, no ulcers noted.   Neurologic: A&O X 3; appropriate affect, sensation is normal,speech is normal, CN 2-12 intact, Pain and light touch intact in extremities, Motor exam as listed above     ASSESSMENT:  Kevin Reeves is a 76 y.o. male whois status post right CEA in October 2007. He has no history of stroke or TIA but does have a history of CAD.  His pedal pulses are not palpable, he does not describe classic claudication symptoms, there are no signs of ischemia in his feet/legs.  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  He takes a daily ASA, Plavix, and a statin.    DATA  Carotid Duplex (06-07-17): Right ICA: endarterectomy site with evidence of 60-79% restenosis. Left ICA:  60-79% stenosis.  Left ECA stenosis. Bilateral vertebral artery flow is antegrade.  Right subclavian artery waveforms are normal, left are biphasic.  No significant change compared to the exam on 04-22-17.    ABI (Date:  06/07/2017):  R:   ABI: 0.88 (was 0.87 on 12-06-16),   PT: bi (was mono)  DP: bi (was mono)  TBI:  0.32 (was 0.46)  L:   ABI: 0.95 (was 1.01),   PT: bi (was bi)  DP: bi (was bi)  TBI: 0.46 (was 0.69)  Stable bilateral ABI, all biphasic waveforms. Mild disease on the right, no significant disease on the left. Decline in bilateral TBI.    Plan:  Graduated walking program discussed and how to achieve.  Follow-up in Carotid Duplex scan, ABI's in a year.   I discussed in depth with the patient the nature of atherosclerosis, and emphasized the importance of maximal medical management including strict control of blood pressure, blood glucose, and lipid levels, obtaining regular exercise, and continued cessation of smoking.  The patient is aware that without maximal medical management the underlying atherosclerotic disease process will progress, limiting the benefit of any interventions.  The patient was given information about stroke prevention and what symptoms should prompt the patient to seek immediate medical care.  The patient was given information about PAD including signs, symptoms, treatment, what symptoms should prompt the patient to seek immediate medical care, and risk reduction measures to take.  Thank you for allowing [place] to participate in this patient's care.  Charisse March, RN, MSN, FNP-C [facility] Office: [phone]  Clinic MD: Dickson/Early 06/07/2017 2:35 PM

## 2017-06-07 NOTE — Patient Instructions (Signed)
Stroke Prevention Some health problems and behaviors may make it more likely for you to have a stroke. Below are ways to lessen your risk of having a stroke.  Be active for at least 30 minutes on most or all days.  Do not smoke. Try not to be around others who smoke.  Do not drink too much alcohol. ? Do not have more than 2 drinks a day if you are a man. ? Do not have more than 1 drink a day if you are a woman and are not pregnant.  Eat healthy foods, such as fruits and vegetables. If you were put on a specific diet, follow the diet as told.  Keep your cholesterol levels under control through diet and medicines. Look for foods that are low in saturated fat, trans fat, cholesterol, and are high in fiber.  If you have diabetes, follow all diet plans and take your medicine as told.  Ask your doctor if you need treatment to lower your blood pressure. If you have high blood pressure (hypertension), follow all diet plans and take your medicine as told by your doctor.  If you are 18-39 years old, have your blood pressure checked every 3-5 years. If you are age 40 or older, have your blood pressure checked every year.  Keep a healthy weight. Eat foods that are low in calories, salt, saturated fat, trans fat, and cholesterol.  Do not take drugs.  Avoid birth control pills, if this applies. Talk to your doctor about the risks of taking birth control pills.  Talk to your doctor if you have sleep problems (sleep apnea).  Take all medicine as told by your doctor. ? You may be told to take aspirin or blood thinner medicine. Take this medicine as told by your doctor. ? Understand your medicine instructions.  Make sure any other conditions you have are being taken care of.  Get help right away if:  You suddenly lose feeling (you feel numb) or have weakness in your face, arm, or leg.  Your face or eyelid hangs down to one side.  You suddenly feel confused.  You have trouble talking  (aphasia) or understanding what people are saying.  You suddenly have trouble seeing in one or both eyes.  You suddenly have trouble walking.  You are dizzy.  You lose your balance or your movements are clumsy (uncoordinated).  You suddenly have a very bad headache and you do not know the cause.  You have new chest pain.  Your heart feels like it is fluttering or skipping a beat (irregular heartbeat). Do not wait to see if the symptoms above go away. Get help right away. Call your local emergency services (911 in [place] Do not drive yourself to the hospital. This information is not intended to replace advice given to you by your health care provider. Make sure you discuss any questions you have with your health care provider. Document Released: 12/13/2011 Document Revised: 11/19/2015 Document Reviewed: 12/14/2012 Elsevier Interactive Patient Education  2018 Elsevier Inc.     Peripheral Vascular Disease Peripheral vascular disease (PVD) is a disease of the blood vessels that are not part of your heart and brain. A simple term for PVD is poor circulation. In most cases, PVD narrows the blood vessels that carry blood from your heart to the rest of your body. This can result in a decreased supply of blood to your arms, legs, and internal organs, like your stomach or kidneys. However, it most often affects   a person's lower legs and feet. There are two types of PVD.  Organic PVD. This is the more common type. It is caused by damage to the structure of blood vessels.  Functional PVD. This is caused by conditions that make blood vessels contract and tighten (spasm).  Without treatment, PVD tends to get worse over time. PVD can also lead to acute ischemic limb. This is when an arm or limb suddenly has trouble getting enough blood. This is a medical emergency. Follow these instructions at home:  Take medicines only as told by your doctor.  Do not use any tobacco products, including  cigarettes, chewing tobacco, or electronic cigarettes. If you need help quitting, ask your doctor.  Lose weight if you are overweight, and maintain a healthy weight as told by your doctor.  Eat a diet that is low in fat and cholesterol. If you need help, ask your doctor.  Exercise regularly. Ask your doctor for some good activities for you.  Take good care of your feet. ? Wear comfortable shoes that fit well. ? Check your feet often for any cuts or sores. Contact a doctor if:  You have cramps in your legs while walking.  You have leg pain when you are at rest.  You have coldness in a leg or foot.  Your skin changes.  You are unable to get or have an erection (erectile dysfunction).  You have cuts or sores on your feet that are not healing. Get help right away if:  Your arm or leg turns cold and blue.  Your arms or legs become red, warm, swollen, painful, or numb.  You have chest pain or trouble breathing.  You suddenly have weakness in your face, arm, or leg.  You become very confused or you cannot speak.  You suddenly have a very bad headache.  You suddenly cannot see. This information is not intended to replace advice given to you by your health care provider. Make sure you discuss any questions you have with your health care provider. Document Released: 09/07/2009 Document Revised: 11/19/2015 Document Reviewed: 11/21/2013 Elsevier Interactive Patient Education  2017 Elsevier Inc.  

## 2017-06-08 NOTE — Addendum Note (Signed)
Addended by: Burton Apley A on: 06/08/2017 12:37 PM   Modules accepted: Orders

## 2017-06-12 DIAGNOSIS — Z7901 Long term (current) use of anticoagulants: Secondary | ICD-10-CM | POA: Diagnosis not present

## 2017-06-12 DIAGNOSIS — E785 Hyperlipidemia, unspecified: Secondary | ICD-10-CM | POA: Diagnosis not present

## 2017-06-12 DIAGNOSIS — I10 Essential (primary) hypertension: Secondary | ICD-10-CM | POA: Diagnosis not present

## 2017-06-12 DIAGNOSIS — I2510 Atherosclerotic heart disease of native coronary artery without angina pectoris: Secondary | ICD-10-CM | POA: Diagnosis not present

## 2017-06-14 DIAGNOSIS — I10 Essential (primary) hypertension: Secondary | ICD-10-CM | POA: Diagnosis not present

## 2017-06-14 DIAGNOSIS — I2510 Atherosclerotic heart disease of native coronary artery without angina pectoris: Secondary | ICD-10-CM | POA: Diagnosis not present

## 2017-06-14 DIAGNOSIS — E785 Hyperlipidemia, unspecified: Secondary | ICD-10-CM | POA: Diagnosis not present

## 2017-06-14 DIAGNOSIS — Z7901 Long term (current) use of anticoagulants: Secondary | ICD-10-CM | POA: Diagnosis not present

## 2017-06-14 DIAGNOSIS — E663 Overweight: Secondary | ICD-10-CM | POA: Diagnosis not present

## 2017-07-24 ENCOUNTER — Other Ambulatory Visit: Payer: Self-pay | Admitting: Physician Assistant

## 2017-11-21 DIAGNOSIS — Z7902 Long term (current) use of antithrombotics/antiplatelets: Secondary | ICD-10-CM | POA: Diagnosis not present

## 2017-11-21 DIAGNOSIS — Z833 Family history of diabetes mellitus: Secondary | ICD-10-CM | POA: Diagnosis not present

## 2017-11-21 DIAGNOSIS — K219 Gastro-esophageal reflux disease without esophagitis: Secondary | ICD-10-CM | POA: Diagnosis not present

## 2017-11-21 DIAGNOSIS — Z7982 Long term (current) use of aspirin: Secondary | ICD-10-CM | POA: Diagnosis not present

## 2017-11-21 DIAGNOSIS — E785 Hyperlipidemia, unspecified: Secondary | ICD-10-CM | POA: Diagnosis not present

## 2017-11-21 DIAGNOSIS — I2510 Atherosclerotic heart disease of native coronary artery without angina pectoris: Secondary | ICD-10-CM | POA: Diagnosis not present

## 2017-11-21 DIAGNOSIS — Z955 Presence of coronary angioplasty implant and graft: Secondary | ICD-10-CM | POA: Diagnosis not present

## 2017-11-21 DIAGNOSIS — Z8249 Family history of ischemic heart disease and other diseases of the circulatory system: Secondary | ICD-10-CM | POA: Diagnosis not present

## 2017-12-07 ENCOUNTER — Ambulatory Visit: Payer: Medicare HMO | Admitting: Family

## 2017-12-07 ENCOUNTER — Encounter (HOSPITAL_COMMUNITY): Payer: Medicare HMO

## 2017-12-11 DIAGNOSIS — E785 Hyperlipidemia, unspecified: Secondary | ICD-10-CM | POA: Diagnosis not present

## 2017-12-11 DIAGNOSIS — I10 Essential (primary) hypertension: Secondary | ICD-10-CM | POA: Diagnosis not present

## 2017-12-11 DIAGNOSIS — I2510 Atherosclerotic heart disease of native coronary artery without angina pectoris: Secondary | ICD-10-CM | POA: Diagnosis not present

## 2017-12-11 DIAGNOSIS — K219 Gastro-esophageal reflux disease without esophagitis: Secondary | ICD-10-CM | POA: Diagnosis not present

## 2018-01-03 DIAGNOSIS — Z125 Encounter for screening for malignant neoplasm of prostate: Secondary | ICD-10-CM | POA: Diagnosis not present

## 2018-01-03 DIAGNOSIS — Z79899 Other long term (current) drug therapy: Secondary | ICD-10-CM | POA: Diagnosis not present

## 2018-01-03 DIAGNOSIS — I10 Essential (primary) hypertension: Secondary | ICD-10-CM | POA: Diagnosis not present

## 2018-01-03 DIAGNOSIS — K219 Gastro-esophageal reflux disease without esophagitis: Secondary | ICD-10-CM | POA: Diagnosis not present

## 2018-01-03 DIAGNOSIS — E785 Hyperlipidemia, unspecified: Secondary | ICD-10-CM | POA: Diagnosis not present

## 2018-01-03 DIAGNOSIS — E663 Overweight: Secondary | ICD-10-CM | POA: Diagnosis not present

## 2018-01-03 DIAGNOSIS — I2510 Atherosclerotic heart disease of native coronary artery without angina pectoris: Secondary | ICD-10-CM | POA: Diagnosis not present

## 2018-01-03 DIAGNOSIS — Z7901 Long term (current) use of anticoagulants: Secondary | ICD-10-CM | POA: Diagnosis not present

## 2018-01-09 ENCOUNTER — Ambulatory Visit: Payer: Medicare HMO | Admitting: Cardiology

## 2018-01-09 ENCOUNTER — Encounter: Payer: Self-pay | Admitting: Cardiology

## 2018-01-09 VITALS — BP 110/70 | HR 82 | Ht 68.0 in | Wt 189.0 lb

## 2018-01-09 DIAGNOSIS — I2510 Atherosclerotic heart disease of native coronary artery without angina pectoris: Secondary | ICD-10-CM

## 2018-01-09 DIAGNOSIS — I10 Essential (primary) hypertension: Secondary | ICD-10-CM | POA: Diagnosis not present

## 2018-01-09 DIAGNOSIS — E782 Mixed hyperlipidemia: Secondary | ICD-10-CM | POA: Diagnosis not present

## 2018-01-09 DIAGNOSIS — I6523 Occlusion and stenosis of bilateral carotid arteries: Secondary | ICD-10-CM | POA: Diagnosis not present

## 2018-01-09 NOTE — Progress Notes (Signed)
Clinical Summary Kevin Reeves is a 77 y.o.male  seen today for follow up of the following medical problems.   1. 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 LV gram at that time 40-45% with akinesis of lateral wall.  -Statin stopped by GI doctor due to abnormal LFTs, stopped rampiril due to low bp's.  - cath 03/2017 as reported below, severe multivessel disease. Referred for CABG. - 04/24/17 CABG LIMA-LAD, SVG-Ramus, SVG-RCA. Medical therapy limited postop due to soft bp's which continued at his last clinic f/u.  - no recent chest pain. No SOB or DOE - compliant with meds    2. HTN - he is compliant with meds  3. Carotid stenosis - prior right CEA - followed by vascular surgery  - no recent neuro symptoms.   4. Hyperlipidemia -. Prior LFT elevation on higher doses - remains compliant with statin    SH: works Manufacturing engineer    Past Medical History:  Diagnosis Date  . Arteriosclerotic cardiovascular disease (ASCVD)    Stent to RI in 2004. Posterior MI in 4/07 with thrombosis of RI at stent site while off Plavix --> reintervention; do not d/c plavix  . Arthritis   . Cerebrovascular disease    Left carotid bruit with 40-59% ICA stenosis; right carotid endarectomy in 2008  . Cholelithiasis 07/21/2009   Asymptomatic  . Colonic polyp 1999   Adenoma resected in 1999; history of diverticulosis  . Coronary artery disease   . Gastroesophageal reflux disease    Dilation of esophagel stricture in 2008  . Hyperlipidemia   . Hypertension   . Myocardial infarction (HCC) 09/2005     No Known Allergies   Current Outpatient Medications  Medication Sig Dispense Refill  . acetaminophen (TYLENOL) 325 MG tablet Take 2 tablets (650 mg total) by mouth every 6 (six) hours as needed for mild pain.    Marland Kitchen aspirin EC 81 MG tablet Take 81 mg by mouth every  evening.     . clopidogrel (PLAVIX) 75 MG tablet Take 75 mg by mouth daily.    Boris Lown Oil 300 MG CAPS Take 1 capsule by mouth daily.     . Multiple Vitamin (MULTIVITAMIN) tablet Take 1 tablet by mouth daily.    Marland Kitchen omeprazole (PRILOSEC) 20 MG capsule Take 20 mg by mouth daily.    . simvastatin (ZOCOR) 40 MG tablet Take 1 tablet (40 mg total) by mouth daily at 6 PM. 30 tablet 1   No current facility-administered medications for this visit.      Past Surgical History:  Procedure Laterality Date  . CARDIAC CATHETERIZATION     cardiac stent placed  . CAROTID ENDARTERECTOMY  2008   Right  . CHOLECYSTECTOMY N/A 03/28/2014   Procedure: LAPAROSCOPIC CHOLECYSTECTOMY;  Surgeon: Dalia Heading, MD;  Location: [facility];  Service: General;  Laterality: N/A;  . COLONOSCOPY  2008   adenoma resected in 1999; no intervention in 2008  . COLONOSCOPY WITH ESOPHAGOGASTRODUODENOSCOPY (EGD)  Feb 2010   Dr. Jena Gauss: mild erosive reflux esophagitis, non-critical Schatzki's ring without dilation, moderate sized hiatal hernia, bulbar erosions. Negative H.pylori serology. Colonoscopy with normal rectum, pancolonic diverticula, needs surveillance every 5 years due to history of adenomas.   . CORONARY ARTERY BYPASS GRAFT N/A 04/24/2017   Procedure: CORONARY ARTERY BYPASS GRAFTING (CABG)x3 using left internal mammary artery and left greater saphenous vein, bilateral  saphenous vein harvested endoscopically;  Surgeon: Alleen Borne, MD;  Location: [facility] OR;  Service: Open Heart Surgery;  Laterality: N/A;  . LEFT HEART CATH AND CORONARY ANGIOGRAPHY N/A 04/19/2017   Procedure: LEFT HEART CATH AND CORONARY ANGIOGRAPHY;  Surgeon: Kathleene Hazel, MD;  Location: [facility];  Service: Cardiovascular;  Laterality: N/A;  . TEE WITHOUT CARDIOVERSION N/A 04/24/2017   Procedure: TRANSESOPHAGEAL ECHOCARDIOGRAM (TEE);  Surgeon: Alleen Borne, MD;  Location: [facility] OR;  Service: Open Heart Surgery;  Laterality: N/A;     No  Known Allergies    Family History  Problem Relation Age of Onset  . Heart disease Father        Before age 71  . Hyperlipidemia Father   . Hypertension Father   . Heart disease Brother   . Heart attack Brother   . Heart disease Brother        Before age 44-  Quad-BPG  . Hyperlipidemia Brother   . Hypertension Brother   . Heart attack Brother   . Stroke Other        Grandparent  . Heart attack Other        Grandparent  . Heart block Unknown        Brother  . Heart disease Paternal Grandmother   . Heart disease Paternal Grandfather   . Colon cancer Neg Hx      Social History Mr. Murakami reports that he has never smoked. He has never used smokeless tobacco. Mr. Fortson reports that he does not drink alcohol.   Review of Systems CONSTITUTIONAL: No weight loss, fever, chills, weakness or fatigue.  HEENT: Eyes: No visual loss, blurred vision, double vision or yellow sclerae.No hearing loss, sneezing, congestion, runny nose or sore throat.  SKIN: No rash or itching.  CARDIOVASCULAR: per hpi RESPIRATORY: No shortness of breath, cough or sputum.  GASTROINTESTINAL: No anorexia, nausea, vomiting or diarrhea. No abdominal pain or blood.  GENITOURINARY: No burning on urination, no polyuria NEUROLOGICAL: No headache, dizziness, syncope, paralysis, ataxia, numbness or tingling in the extremities. No change in bowel or bladder control.  MUSCULOSKELETAL: No muscle, back pain, joint pain or stiffness.  LYMPHATICS: No enlarged nodes. No history of splenectomy.  PSYCHIATRIC: No history of depression or anxiety.  ENDOCRINOLOGIC: No reports of sweating, cold or heat intolerance. No polyuria or polydipsia.  Marland Kitchen   Physical Examination Vitals:   01/09/18 1024  BP: 110/70  Pulse: 82  SpO2: 98%   Vitals:   01/09/18 1024  Weight: 189 lb (85.7 kg)  Height: 5\' 8"  (1.727 m)    Gen: resting comfortably, no acute distress HEENT: no scleral icterus, pupils equal round and reactive, no  palptable cervical adenopathy,  CV: RRR, no m/r/g, no jvd Resp: Clear to auscultation bilaterally GI: abdomen is soft, non-tender, non-distended, normal bowel sounds, no hepatosplenomegaly MSK: extremities are warm, no edema.  Skin: warm, no rash Neuro:  no focal deficits Psych: appropriate affect   Diagnostic Studies 09/2005 Cath RESULTS: The aortic pressure was 122/62 with mean of 89 and left  ventricular pressure was 122/20.  LEFT MAIN CORONARY ARTERY: The left main coronary artery was free of  disease.  LEFT ANTERIOR DESCENDING ARTERY: The left anterior descending artery gave  rise to a large diagonal Naamah Boggess and two septal perforators. There was 70%  narrowing of the proximal LAD and 90% stenosis of the ostium of the  diagonal Alexandr Oehler.  CIRCUMFLEX ARTERY: The circumflex artery gave rise to a ramus Javion Holmer and  two marginal branches and the posterolateral branches. The ramus Edwardine Deschepper was  completed occluded near its ostium within the stent. There was __________  ostial stenosis of the first marginal Blaze Nylund.  RIGHT CORONARY ARTERY: The right coronary artery is a moderate size vessel  that gave rise to a right ventricular Chava Dulac, posterior descending Eudelia Hiltunen  and two posterolateral branches. There was 30% narrowing in the mid right  coronary artery.  LEFT VENTRICULOGRAM: The left ventriculogram performed in the RAO  projection showed akinesis of the lateral wall. The estimated ejection  fraction was 40-45%. The left ventriculogram performed in the LAO  projection showed akinesis of the posterior and inferolateral wall. The tip  of the apex and septum moved well and the superior portion of the posterior  wall moved well.  Following aspiration thrombectomy and PTCA of the in-stent thrombotic lesion  in the ramus Quatisha Zylka of the circumflex artery the stenosis improved from 100%  to 0% and the flow improved from TIMI 0 to TIMI 3 flow. Myocardial blush  improved from  grade 0 to grade 2.  The patient had the onset of chest pain at 12:35 and arrived at [facility]  emergency room at 12:42. The first ECG was obtained at 12:50 and this was  not diagnostic. The second ECG at 1325 was diagnostic for a posterior  infarction. The patient arrived at the cath lab at [facility] at 1510 and  the reperfusion was established with a diver catheter at 1552. This gave a  __________ time from the time of the diagnostic ECG of 2 hours and 27  minutes and a refusion time of 2 hours and 27 minutes.  CONCLUSION:  1. Acute posterior wall myocardial infarction due to stent thrombosis  within the stent in the ramus Perle Brickhouse of circumflex artery with a total  occlusion of the ramus Mei Suits of the circumflex artery, 70% narrowing at  the ostium of the first marginal Waddell Iten of the circumflex artery, 70%  narrowing of the proximal LAD with 90% narrowing in the large diagonal  Tanyah Debruyne, 30% narrowing in the mid right coronary and posterior and  inferolateral wall akinesis with an estimated ejection fraction of 40-  45%.  2. Successful aspiration thrombectomy and PTCA for in-stent thrombosis of  the ramus Donnita Farina of the circumflex artery with improvement in central  narrowing from 100% to 0%, improvement of flow from TIMI 0 to TIMI 3  flow.    03/2017 cath  Mid RCA lesion, 80 %stenosed.  Dist RCA lesion, 60 %stenosed.  Ost Cx to Prox Cx lesion, 99 %stenosed.  Ramus-2 lesion, 95 %stenosed.  Ramus-1 lesion, 10 %stenosed.  Ost 1st Mrg to 1st Mrg lesion, 80 %stenosed.  Ost 2nd Mrg to 2nd Mrg lesion, 90 %stenosed.  Ost 2nd Diag to 2nd Diag lesion, 99 %stenosed.  Prox LAD to Mid LAD lesion, 90 %stenosed.  Mid LAD lesion, 80 %stenosed.  Dist LAD lesion, 70 %stenosed.  The left ventricular systolic function is normal.  LV end diastolic pressure is normal.  The left ventricular ejection fraction is 50-55% by visual estimate.  There is no mitral valve  regurgitation.   1. Severe triple vessel CAD 2. Preserved LV systolic function  Recommendations: Will consult CT surgery for CABG. He has been on Plavix. Will need Plavix washout prior to surgery. He may be able to go home tomorrow if CABG is planned for next week.     Assessment and Plan   1. CAD - no recent symptoms, continue  medical therapy - medical therapy limited by low bp's. Committed to lifelong DAPT  2. HTN - at goal, currently not requiring therapy.  3. Carotid stenosis - continue regular follow up with vascular - no recent symptoms, continue medical therapy.   4. Hyperlipidemia - continue statin. Prior elevaged LFTs on more potent statin - request labs from pcp       Antoine Poche, M.D.

## 2018-01-09 NOTE — Patient Instructions (Signed)

## 2018-01-19 ENCOUNTER — Other Ambulatory Visit: Payer: Self-pay

## 2018-01-19 ENCOUNTER — Ambulatory Visit (HOSPITAL_COMMUNITY)
Admission: RE | Admit: 2018-01-19 | Discharge: 2018-01-19 | Disposition: A | Discharge status: Home / Self Care | Payer: Medicare HMO | Source: Ambulatory Visit | Attending: Family | Admitting: Family

## 2018-01-19 ENCOUNTER — Encounter: Payer: Self-pay | Admitting: Family

## 2018-01-19 ENCOUNTER — Ambulatory Visit: Payer: Medicare HMO | Admitting: Family

## 2018-01-19 VITALS — BP 118/71 | HR 69 | Temp 97.1°F | Resp 16 | Ht 68.0 in | Wt 191.6 lb

## 2018-01-19 DIAGNOSIS — Z9889 Other specified postprocedural states: Secondary | ICD-10-CM | POA: Diagnosis not present

## 2018-01-19 DIAGNOSIS — I2510 Atherosclerotic heart disease of native coronary artery without angina pectoris: Secondary | ICD-10-CM | POA: Insufficient documentation

## 2018-01-19 DIAGNOSIS — I6523 Occlusion and stenosis of bilateral carotid arteries: Secondary | ICD-10-CM | POA: Diagnosis not present

## 2018-01-19 DIAGNOSIS — I10 Essential (primary) hypertension: Secondary | ICD-10-CM | POA: Diagnosis not present

## 2018-01-19 DIAGNOSIS — E785 Hyperlipidemia, unspecified: Secondary | ICD-10-CM | POA: Insufficient documentation

## 2018-01-19 DIAGNOSIS — I779 Disorder of arteries and arterioles, unspecified: Secondary | ICD-10-CM

## 2018-01-19 DIAGNOSIS — I708 Atherosclerosis of other arteries: Secondary | ICD-10-CM | POA: Diagnosis not present

## 2018-01-19 NOTE — Progress Notes (Signed)
Chief Complaint: Follow up Extracranial Carotid Artery Stenosis   History of Present Illness  Kevin Reeves is a 77 y.o. male who is status post right CEA in October 2007 by Dr. Hart Rochester and returns today for follow up.  The patient has no history of TIA or stroke symptoms, specifically he denies a history of amaurosis fugax or monocular blindness, unilateralfacial drooping, hemiplegia, or receptive or expressive aphasia.   He denies claudication symptoms in legs with walking.  He had 3 vessel CABG in October 2018. He was having dyspnea and had a cardiac cath.   Pt and wife states he has worsening trouble walking, states he pain in his joints, denies radiculopathy type pain, this is under evaluation per his wife. Cholecystectomy, March 28, 2014.  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 Arteriosclerotic cardiovascular disease (ASCVD)    Stent to RI in 2004. Posterior MI in 4/07 with thrombosis of RI at stent site while off Plavix --> reintervention; do not d/c plavix  . Arthritis   . Cerebrovascular disease    Left carotid bruit with 40-59% ICA stenosis; right carotid endarectomy in 2008  . Cholelithiasis 07/21/2009   Asymptomatic  . Colonic polyp 1999   Adenoma resected in 1999; history of diverticulosis  . Coronary artery disease   . Gastroesophageal reflux disease    Dilation of esophagel stricture in 2008  . Hyperlipidemia   . Hypertension   . Myocardial infarction [facility]) 09/2005    Social History Social History   Tobacco Use  . Smoking status: Never Smoker  . Smokeless tobacco: Never Used  Substance Use Topics  . Alcohol use: No    Alcohol/week: 0.0 oz  . Drug use: No    Family History Family History  Problem Relation Age of Onset  . Heart disease  Father        Before age 77  . Hyperlipidemia Father   . Hypertension Father   . Heart disease Brother   . Heart attack Brother   . Heart disease Brother        Before age 73-  Quad-BPG  . Hyperlipidemia Brother   . Hypertension Brother   . Heart attack Brother   . Stroke Other        Grandparent  . Heart attack Other        Grandparent  . Heart block Unknown        Brother  . Heart disease Paternal Grandmother   . Heart disease Paternal Grandfather   . Colon cancer Neg Hx     Surgical History Past Surgical History:  Procedure Laterality Date  . CARDIAC CATHETERIZATION     cardiac stent placed  . CAROTID ENDARTERECTOMY  2008   Right  . CHOLECYSTECTOMY N/A 03/28/2014   Procedure: LAPAROSCOPIC CHOLECYSTECTOMY;  Surgeon: Dalia Heading, MD;  Location: [facility];  Service: General;  Laterality: N/A;  . COLONOSCOPY  2008   adenoma resected in 1999; no intervention in 2008  . COLONOSCOPY WITH ESOPHAGOGASTRODUODENOSCOPY (EGD)  Feb 2010   Dr. Jena Gauss: mild erosive reflux esophagitis, non-critical Schatzki's ring without dilation, moderate sized hiatal hernia, bulbar erosions. Negative H.pylori serology. Colonoscopy with normal rectum, pancolonic diverticula, needs surveillance every 5 years due to history of adenomas.   . CORONARY ARTERY BYPASS GRAFT N/A 04/24/2017   Procedure:  CORONARY ARTERY BYPASS GRAFTING (CABG)x3 using left internal mammary artery and left greater saphenous vein, bilateral saphenous vein harvested endoscopically;  Surgeon: Alleen Borne, MD;  Location: [facility] OR;  Service: Open Heart Surgery;  Laterality: N/A;  . LEFT HEART CATH AND CORONARY ANGIOGRAPHY N/A 04/19/2017   Procedure: LEFT HEART CATH AND CORONARY ANGIOGRAPHY;  Surgeon: Kathleene Hazel, MD;  Location[facility];  Service: Cardiovascular;  Laterality: N/A;  . TEE WITHOUT CARDIOVERSION N/A 04/24/2017   Procedure: TRANSESOPHAGEAL ECHOCARDIOGRAM (TEE);  Surgeon: Alleen Borne, MD;  Location[facility]  OR;  Service: Open Heart Surgery;  Laterality: N/A;    No Known Allergies  Current Outpatient Medications  Medication Sig Dispense Refill  . aspirin EC 81 MG tablet Take 81 mg by mouth every evening.     . clopidogrel (PLAVIX) 75 MG tablet Take 75 mg by mouth daily.    Boris Lown Oil 300 MG CAPS Take 1 capsule by mouth daily.     . Multiple Vitamin (MULTIVITAMIN) tablet Take 1 tablet by mouth daily.    Marland Kitchen omeprazole (PRILOSEC) 20 MG capsule Take 20 mg by mouth daily.    . simvastatin (ZOCOR) 40 MG tablet Take 1 tablet (40 mg total) by mouth daily at 6 PM. 30 tablet 1   No current facility-administered medications for this visit.     Review of Systems : See HPI for pertinent positives and negatives.  Physical Examination  Vitals:   01/19/18 1432 01/19/18 1434  BP: 121/72 118/71  Pulse: 69   Resp: 16   Temp: (!) 97.1 F (36.2 C)   TempSrc: Oral   SpO2: 98%   Weight: 191 lb 9.6 oz (86.9 kg)   Height: 5\' 8"  (1.727 m)    Body mass index is 29.13 kg/m.  General: WDWN male in NAD GAIT:stiff, slow Eyes: PERRLA Pulmonary: Respirations are non-labored, CTAB, no rales, rhonchi, or wheezing.  Cardiac: regular rhythm,no detected murmur.  VASCULAR EXAM Carotid Bruits Right Left   Negative Negative   Radial pulses are 2+ palpable and equal. Abdominal aortic pulse is not palpable      LE Pulses Right Left   POPLITEAL not palpable  not palpable   POSTERIOR TIBIAL not palpable  not palpable    DORSALIS PEDIS  ANTERIOR TIBIAL not palpable  not palpable     Gastrointestinal: soft, nontender, BS WNL, no r/g, no masses palpated.  Musculoskeletal: No muscle atrophy/wasting. M/S 5/5 throughout, extremities without ischemic changes. Feet are warm, pink, with brisk capillary refill.  Skin: No rash, no  cellulitis, no ulcers noted.   Neurologic: A&O X 3; appropriate affect, sensation is normal,speech is normal, CN 2-12 intact, Pain and light touch intact in extremities, Motor exam as listed above  Skin: No rashes, no ulcers, no cellulitis.   Neurologic:  A&O X 3; appropriate affect, sensation is normal; speech is normal, CN 2-12 intact, pain and light touch intact in extremities, motor exam as listed above. Psychiatric: Normal thought content, mood appropriate to clinical situation.    Assessment: Kevin Reeves is a 77 y.o. male whois status post right CEA in October 2007. He has no history of stroke or TIA but does have a history of CAD.  His pedal pulses are not palpable, he does not describe classic claudication symptoms, there are no signs of ischemia in his feet/legs.  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  He takes a daily ASA,  Plavix, and a statin.   DATA  CarotidDuplex(01-19-18): Right ICA, CEA site with 40-59% stenosis Left ICA 40-59% stenosis Left ECA: >50% stenosis Right vertebral artery with no discernable flow, left has antegrade flow. Right subclavian artery is stenotic, left with normal flow hemodynamics.  Velocities seem less in the bilateral ICA compared to the exam on 06-07-17. Inability to locate right vertebral artery flow is new compared to previous exam.    ABI (Date: 06/07/2017):  R:  ? ABI: 0.88 (was 0.87 on 12-06-16),  ? PT: bi (was mono) ? DP: bi (was mono) ? TBI:  0.32 (was 0.46)  L:  ? ABI: 0.95 (was 1.01),  ? PT: bi (was bi) ? DP: bi (was bi) ? TBI: 0.46 (was 0.69) ? Stable bilateral ABI, all biphasic waveforms. Mild disease on the right, no significant disease on the left. Decline in bilateral TBI.    Plan:  Graduated walking program discussed and how to achieve. Follow-up in Carotid Duplex scan and ABI's.  Walk at least 30 minutes daily in a safe  environment.    I discussed in depth with the patient the nature of atherosclerosis, and emphasized the importance of maximal medical management including strict control of blood pressure, blood glucose, and lipid levels, obtaining regular exercise, and continued cessation of smoking.  The patient is aware that without maximal medical management the underlying atherosclerotic disease process will progress, limiting the benefit of any interventions. The patient was given information about stroke prevention and what symptoms should prompt the patient to seek immediate medical care. Thank you for allowing [place] to participate in this patient's care.  Charisse March, RN, MSN, FNP-C [facility] Office: [phone]  Clinic Physician: Randie Heinz  01/19/18 2:35 PM

## 2018-01-19 NOTE — Patient Instructions (Signed)
Stroke Prevention Some health problems and behaviors may make it more likely for you to have a stroke. Below are ways to lessen your risk of having a stroke.  Be active for at least 30 minutes on most or all days.  Do not smoke. Try not to be around others who smoke.  Do not drink too much alcohol. ? Do not have more than 2 drinks a day if you are a man. ? Do not have more than 1 drink a day if you are a woman and are not pregnant.  Eat healthy foods, such as fruits and vegetables. If you were put on a specific diet, follow the diet as told.  Keep your cholesterol levels under control through diet and medicines. Look for foods that are low in saturated fat, trans fat, cholesterol, and are high in fiber.  If you have diabetes, follow all diet plans and take your medicine as told.  Ask your doctor if you need treatment to lower your blood pressure. If you have high blood pressure (hypertension), follow all diet plans and take your medicine as told by your doctor.  If you are 18-39 years old, have your blood pressure checked every 3-5 years. If you are age 40 or older, have your blood pressure checked every year.  Keep a healthy weight. Eat foods that are low in calories, salt, saturated fat, trans fat, and cholesterol.  Do not take drugs.  Avoid birth control pills, if this applies. Talk to your doctor about the risks of taking birth control pills.  Talk to your doctor if you have sleep problems (sleep apnea).  Take all medicine as told by your doctor. ? You may be told to take aspirin or blood thinner medicine. Take this medicine as told by your doctor. ? Understand your medicine instructions.  Make sure any other conditions you have are being taken care of.  Get help right away if:  You suddenly lose feeling (you feel numb) or have weakness in your face, arm, or leg.  Your face or eyelid hangs down to one side.  You suddenly feel confused.  You have trouble talking  (aphasia) or understanding what people are saying.  You suddenly have trouble seeing in one or both eyes.  You suddenly have trouble walking.  You are dizzy.  You lose your balance or your movements are clumsy (uncoordinated).  You suddenly have a very bad headache and you do not know the cause.  You have new chest pain.  Your heart feels like it is fluttering or skipping a beat (irregular heartbeat). Do not wait to see if the symptoms above go away. Get help right away. Call your local emergency services (911 in [place] Do not drive yourself to the hospital. This information is not intended to replace advice given to you by your health care provider. Make sure you discuss any questions you have with your health care provider. Document Released: 12/13/2011 Document Revised: 11/19/2015 Document Reviewed: 12/14/2012 Elsevier Interactive Patient Education  2018 Elsevier Inc.     Peripheral Vascular Disease Peripheral vascular disease (PVD) is a disease of the blood vessels that are not part of your heart and brain. A simple term for PVD is poor circulation. In most cases, PVD narrows the blood vessels that carry blood from your heart to the rest of your body. This can result in a decreased supply of blood to your arms, legs, and internal organs, like your stomach or kidneys. However, it most often affects   a person's lower legs and feet. There are two types of PVD.  Organic PVD. This is the more common type. It is caused by damage to the structure of blood vessels.  Functional PVD. This is caused by conditions that make blood vessels contract and tighten (spasm).  Without treatment, PVD tends to get worse over time. PVD can also lead to acute ischemic limb. This is when an arm or limb suddenly has trouble getting enough blood. This is a medical emergency. Follow these instructions at home:  Take medicines only as told by your doctor.  Do not use any tobacco products, including  cigarettes, chewing tobacco, or electronic cigarettes. If you need help quitting, ask your doctor.  Lose weight if you are overweight, and maintain a healthy weight as told by your doctor.  Eat a diet that is low in fat and cholesterol. If you need help, ask your doctor.  Exercise regularly. Ask your doctor for some good activities for you.  Take good care of your feet. ? Wear comfortable shoes that fit well. ? Check your feet often for any cuts or sores. Contact a doctor if:  You have cramps in your legs while walking.  You have leg pain when you are at rest.  You have coldness in a leg or foot.  Your skin changes.  You are unable to get or have an erection (erectile dysfunction).  You have cuts or sores on your feet that are not healing. Get help right away if:  Your arm or leg turns cold and blue.  Your arms or legs become red, warm, swollen, painful, or numb.  You have chest pain or trouble breathing.  You suddenly have weakness in your face, arm, or leg.  You become very confused or you cannot speak.  You suddenly have a very bad headache.  You suddenly cannot see. This information is not intended to replace advice given to you by your health care provider. Make sure you discuss any questions you have with your health care provider. Document Released: 09/07/2009 Document Revised: 11/19/2015 Document Reviewed: 11/21/2013 Elsevier Interactive Patient Education  2017 Elsevier Inc.  

## 2018-02-12 ENCOUNTER — Other Ambulatory Visit: Payer: Self-pay

## 2018-02-12 DIAGNOSIS — I6523 Occlusion and stenosis of bilateral carotid arteries: Secondary | ICD-10-CM

## 2018-02-12 DIAGNOSIS — R0989 Other specified symptoms and signs involving the circulatory and respiratory systems: Secondary | ICD-10-CM

## 2018-03-28 DIAGNOSIS — H5200 Hypermetropia, unspecified eye: Secondary | ICD-10-CM | POA: Diagnosis not present

## 2018-04-13 DIAGNOSIS — R69 Illness, unspecified: Secondary | ICD-10-CM | POA: Diagnosis not present

## 2018-05-16 IMAGING — CR DG CHEST 2V
2 series · 2 of 2 positions shown · non-contrast
Comparison: 04/26/2017

CLINICAL DATA: CABG .  Coronary artery disease

EXAM:
CHEST  2 VIEW

[w chest pa]
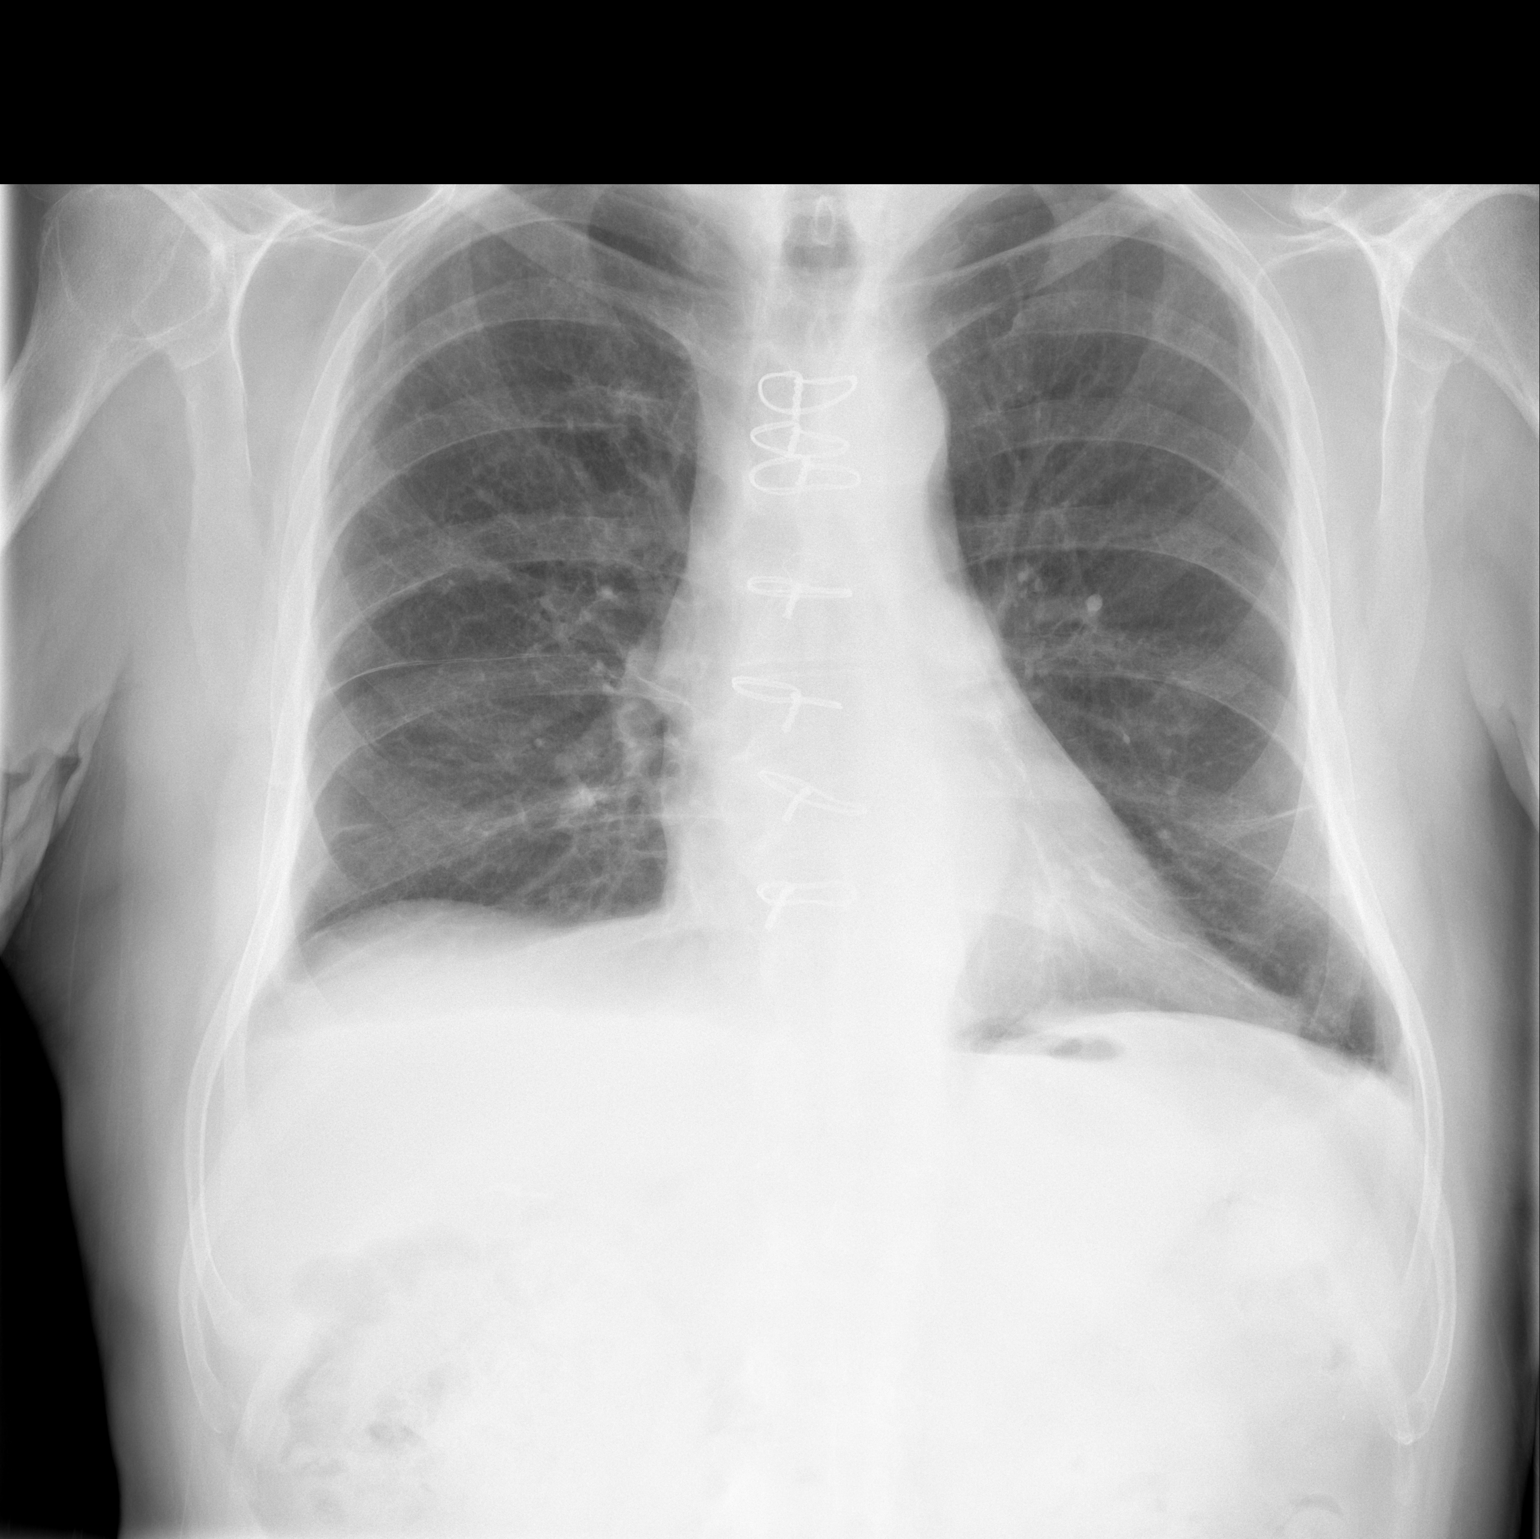

[w chest lat]
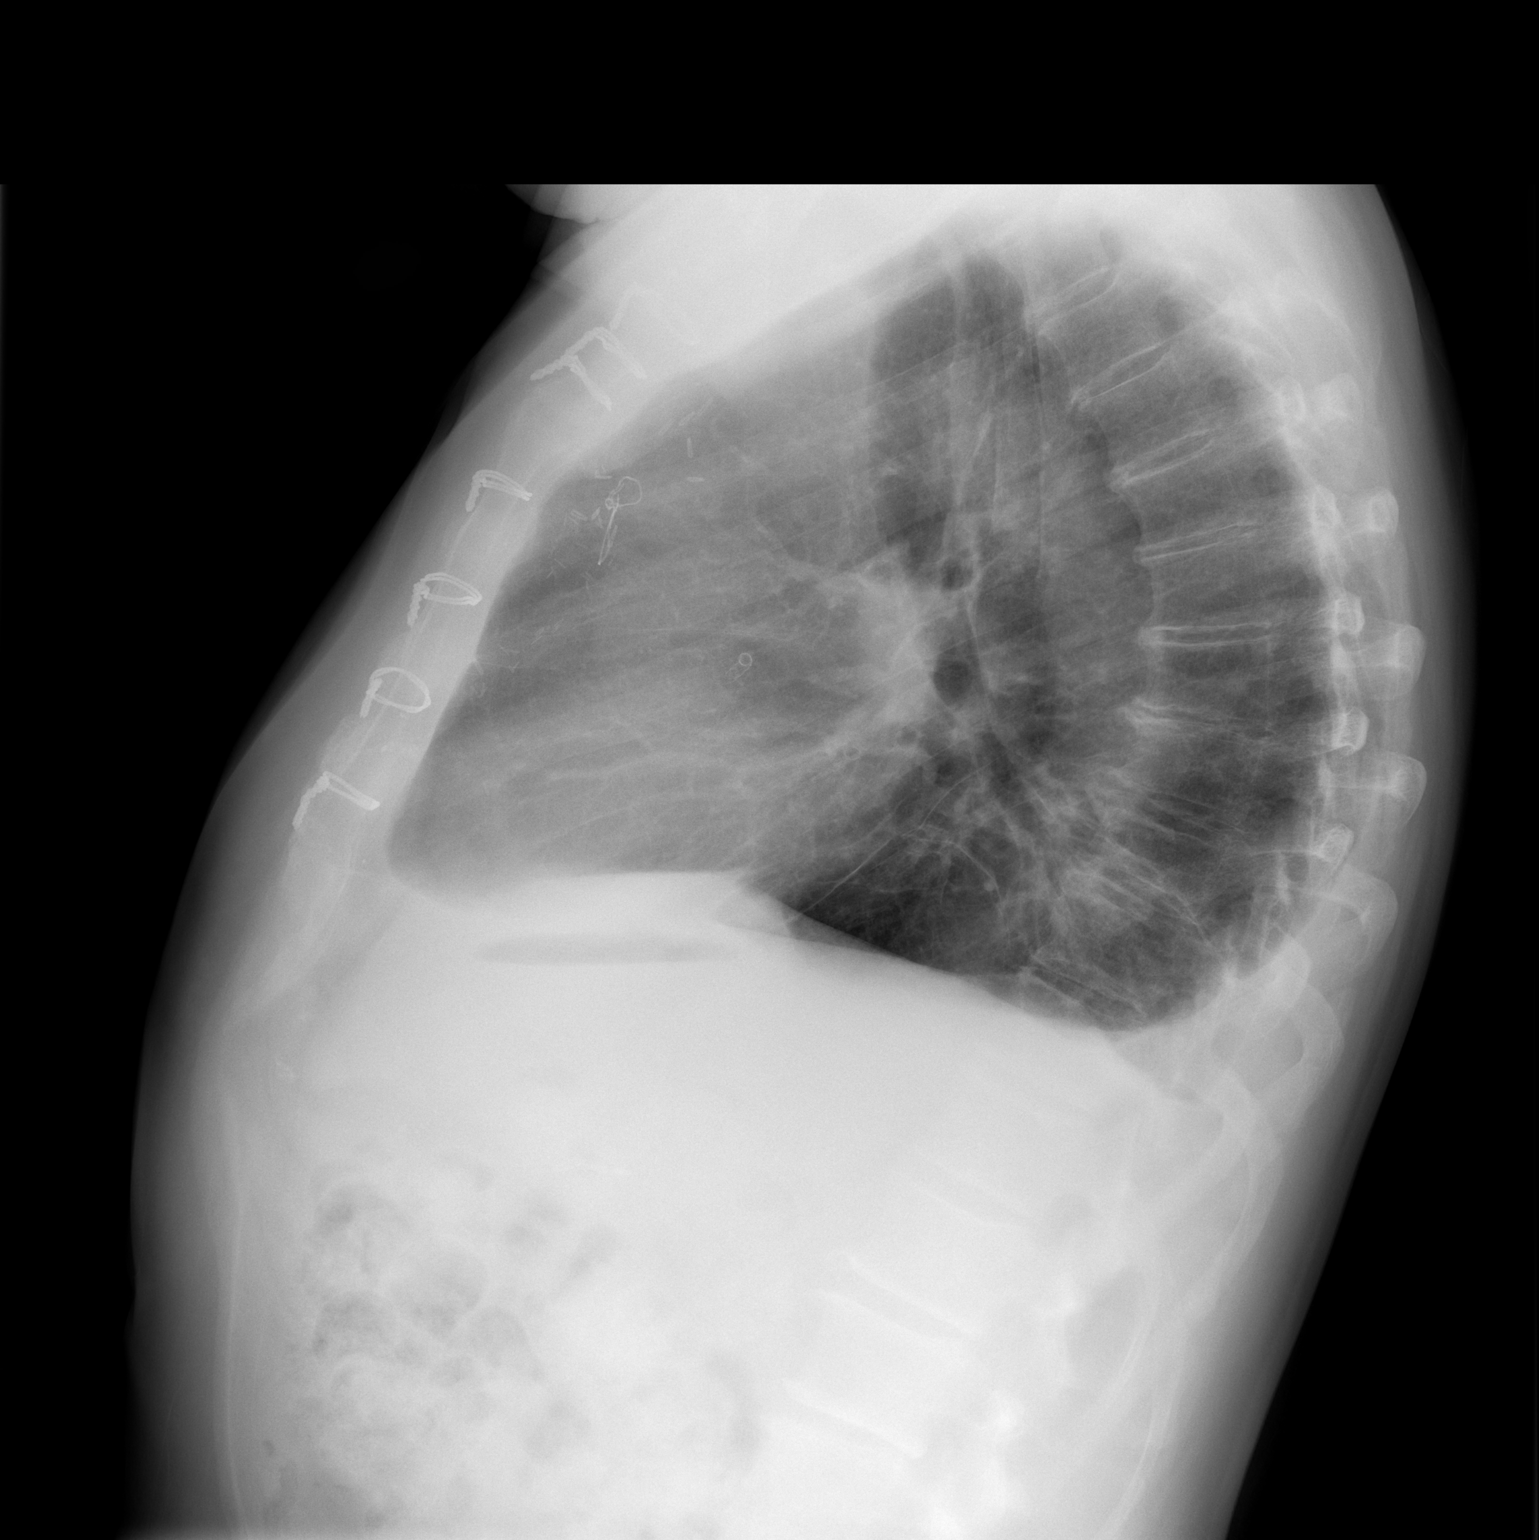

[2 of 2 positions shown; findings below may reference images not displayed]

FINDINGS: Improved aeration of the lung bases with decreased atelectasis and
pleural effusions. Small pleural effusions are present bilaterally.
Negative for heart failure or pneumonia.
IMPRESSION: Improvement in bibasilar atelectasis and effusion. Small residual
effusions.

## 2018-06-11 DIAGNOSIS — Z951 Presence of aortocoronary bypass graft: Secondary | ICD-10-CM | POA: Diagnosis not present

## 2018-06-11 DIAGNOSIS — I10 Essential (primary) hypertension: Secondary | ICD-10-CM | POA: Diagnosis not present

## 2018-06-11 DIAGNOSIS — I2510 Atherosclerotic heart disease of native coronary artery without angina pectoris: Secondary | ICD-10-CM | POA: Diagnosis not present

## 2018-06-11 DIAGNOSIS — E785 Hyperlipidemia, unspecified: Secondary | ICD-10-CM | POA: Diagnosis not present

## 2018-06-25 DIAGNOSIS — Z79899 Other long term (current) drug therapy: Secondary | ICD-10-CM | POA: Diagnosis not present

## 2018-06-25 DIAGNOSIS — Z125 Encounter for screening for malignant neoplasm of prostate: Secondary | ICD-10-CM | POA: Diagnosis not present

## 2018-06-25 DIAGNOSIS — I2510 Atherosclerotic heart disease of native coronary artery without angina pectoris: Secondary | ICD-10-CM | POA: Diagnosis not present

## 2018-06-25 DIAGNOSIS — I10 Essential (primary) hypertension: Secondary | ICD-10-CM | POA: Diagnosis not present

## 2018-06-25 DIAGNOSIS — K219 Gastro-esophageal reflux disease without esophagitis: Secondary | ICD-10-CM | POA: Diagnosis not present

## 2018-06-25 DIAGNOSIS — R7309 Other abnormal glucose: Secondary | ICD-10-CM | POA: Diagnosis not present

## 2018-06-25 DIAGNOSIS — Z7901 Long term (current) use of anticoagulants: Secondary | ICD-10-CM | POA: Diagnosis not present

## 2018-06-25 DIAGNOSIS — E663 Overweight: Secondary | ICD-10-CM | POA: Diagnosis not present

## 2018-06-25 DIAGNOSIS — E785 Hyperlipidemia, unspecified: Secondary | ICD-10-CM | POA: Diagnosis not present

## 2018-07-13 ENCOUNTER — Encounter: Payer: Self-pay | Admitting: Cardiology

## 2018-07-13 ENCOUNTER — Encounter: Payer: Self-pay | Admitting: *Deleted

## 2018-07-13 ENCOUNTER — Ambulatory Visit: Payer: Medicare HMO | Admitting: Cardiology

## 2018-07-13 VITALS — BP 118/62 | HR 74 | Ht 67.0 in | Wt 197.0 lb

## 2018-07-13 DIAGNOSIS — E782 Mixed hyperlipidemia: Secondary | ICD-10-CM | POA: Diagnosis not present

## 2018-07-13 DIAGNOSIS — I2510 Atherosclerotic heart disease of native coronary artery without angina pectoris: Secondary | ICD-10-CM

## 2018-07-13 DIAGNOSIS — I10 Essential (primary) hypertension: Secondary | ICD-10-CM

## 2018-07-13 NOTE — Progress Notes (Signed)
Clinical Summary [name] is a 78 y.o.male seen today for follow up of the following medical problems.   1. CAD/ICM/Chronic systolic HF - prior stenting in 2004 to ramus, 2007 MI with thrombosis of stent while off plavix,has been committed to lifelong plavix.LV gram at that time 40-45% with akinesis of lateral wall.  -Statin stopped by GI doctor due to abnormal LFTs, stopped rampiril due to low bp's.  - cath 03/2017 as reported below, severe multivessel disease. Referred for CABG. - 04/24/17 CABG LIMA-LAD, SVG-Ramus, SVG-RCA. Medical therapy limited postop due to soft bp's which continued at his last clinic f/u. - 03/2017 echo LVEF 55-60%    - denies any chest pain, no SOB/DOE - compliant with meds    2. HTN - more recent issues with low bp's, bp meds have been stopped  3. Carotid stenosis - prior right CEA - followed by vascular surgery  - upcoming appt along with carotid US with vascular later this month  4. Hyperlipidemia -. Prior LFT elevation on higher doses -compliant with statin, labs followed by pcp    SH: works raising beef cattle. Has several dogs he takes care of.     Past Medical History:  Diagnosis Date  . Arteriosclerotic cardiovascular disease (ASCVD)    Stent to RI in 2004. Posterior MI in 4/07 with thrombosis of RI at stent site while off Plavix --> reintervention; do not d/c plavix  . Arthritis   . Cerebrovascular disease    Left carotid bruit with 40-59% ICA stenosis; right carotid endarectomy in 2008  . Cholelithiasis 07/21/2009   Asymptomatic  . Colonic polyp 1999   Adenoma resected in 1999; history of diverticulosis  . Coronary artery disease   . Gastroesophageal reflux disease    Dilation of esophagel stricture in 2008  . Hyperlipidemia   . Hypertension   . Myocardial infarction (HCC) 09/2005     No Known Allergies   Current Outpatient Medications  Medication Sig Dispense Refill  . aspirin EC 81 MG tablet  Take 81 mg by mouth every evening.     . clopidogrel (PLAVIX) 75 MG tablet Take 75 mg by mouth daily.    Kevin Reeves. Kevin Reeves 300 MG CAPS Take 1 capsule by mouth daily.     . Multiple Vitamin (MULTIVITAMIN) tablet Take 1 tablet by mouth daily.    Marland Kitchen. omeprazole (PRILOSEC) 20 MG capsule Take 20 mg by mouth daily.    . simvastatin (ZOCOR) 40 MG tablet Take 1 tablet (40 mg total) by mouth daily at 6 PM. 30 tablet 1   No current facility-administered medications for this visit.      Past Surgical History:  Procedure Laterality Date  . CARDIAC CATHETERIZATION     cardiac stent placed  . CAROTID ENDARTERECTOMY  2008   Right  . CHOLECYSTECTOMY N/A 03/28/2014   Procedure: LAPAROSCOPIC CHOLECYSTECTOMY;  Surgeon: Kevin HeadingMark A Jenkins, MD;  Location: [facility];  Service: General;  Laterality: N/A;  . COLONOSCOPY  2008   adenoma resected in 1999; no intervention in 2008  . COLONOSCOPY WITH ESOPHAGOGASTRODUODENOSCOPY (EGD)  Feb 2010   Dr. Jena Reeves: mild erosive reflux esophagitis, non-critical Schatzki's ring without dilation, moderate sized hiatal hernia, bulbar erosions. Negative H.pylori serology. Colonoscopy with normal rectum, pancolonic diverticula, needs surveillance every 5 years due to history of adenomas.   . CORONARY ARTERY BYPASS GRAFT N/A 04/24/2017   Procedure: CORONARY ARTERY BYPASS GRAFTING (CABG)x3 using left internal mammary artery and left greater saphenous vein, bilateral saphenous vein  harvesAlleen Borneically;  Surgeon: Reeves, Kevin K, MD;  Location: [facility];  Service: Open Heart Surgery;  Laterality: N/A;  . LEFT HEART CATH AND CORONARY ANGIOGRAPHY N/A 04/19/2017   Procedure: LEFT HEART CATH AND CORONARY ANGIOGRAPHY;  Surgeon: Kevin Hazel, MD;  Location: [facility] IN[facility];  Service: Cardiovascular;  Laterality: N/A;  . TEE WITHOUT CARDIOVERSION N/A 04/24/2017   Procedure: TRANSESOPHAGEAL ECHOCARDIOGRAM (TEE);  Surgeon: Kevin Borne, MD;  Location: [facility] OR;  Service: Open Heart Surgery;   Laterality: N/A;     No Known Allergies    Family History  Problem Relation Age of Onset  . Heart disease Father        Before age 55  . Hyperlipidemia Father   . Hypertension Father   . Heart disease Brother   . Heart attack Brother   . Heart disease Brother        Before age 35-  Quad-BPG  . Hyperlipidemia Brother   . Hypertension Brother   . Heart attack Brother   . Stroke Other        Grandparent  . Heart attack Other        Grandparent  . Heart block Unknown        Brother  . Heart disease Paternal Grandmother   . Heart disease Paternal Grandfather   . Colon cancer Neg Hx      Social History Mr. Treadway reports that he has never smoked. He has never used smokeless tobacco. Mr. Stump reports no history of alcohol use.   Review of Systems CONSTITUTIONAL: No weight loss, fever, chills, weakness or fatigue.  HEENT: Eyes: No visual loss, blurred vision, double vision or yellow sclerae.No hearing loss, sneezing, congestion, runny nose or sore throat.  SKIN: No rash or itching.  CARDIOVASCULAR: per hpi RESPIRATORY: No shortness of breath, cough or sputum.  GASTROINTESTINAL: No anorexia, nausea, vomiting or diarrhea. No abdominal pain or blood.  GENITOURINARY: No burning on urination, no polyuria NEUROLOGICAL: No headache, dizziness, syncope, paralysis, ataxia, numbness or tingling in the extremities. No change in bowel or bladder control.  MUSCULOSKELETAL: No muscle, back pain, joint pain or stiffness.  LYMPHATICS: No enlarged nodes. No history of splenectomy.  PSYCHIATRIC: No history of depression or anxiety.  ENDOCRINOLOGIC: No reports of sweating, cold or heat intolerance. No polyuria or polydipsia.  Marland Kitchen   Physical Examination Vitals:   07/13/18 1041  BP: 118/62  Pulse: 74  SpO2: 94%   Vitals:   07/13/18 1041  Weight: 197 lb (89.4 kg)  Height:  (1.702 m)    Gen: resting comfortably, no acute distress HEENT: no scleral icterus, pupils equal round  and reactive, no palptable cervical adenopathy,  CV: RRR, no m/r/g, no jvd Resp: Clear to auscultation bilaterally GI: abdomen is soft, non-tender, non-distended, normal bowel sounds, no hepatosplenomegaly MSK: extremities are warm, no edema.  Skin: warm, no rash Neuro:  no focal deficits Psych: appropriate affect   Diagnostic Studies 09/2005 Cath RESULTS: The aortic pressure was 122/62 with mean of 89 and left  ventricular pressure was 122/20.  LEFT MAIN CORONARY ARTERY: The left main coronary artery was free of  disease.  LEFT ANTERIOR DESCENDING ARTERY: The left anterior descending artery gave  rise to a large diagonal Kevin Reeves and two septal perforators. There was 70%  narrowing of the proximal LAD and 90% stenosis of the ostium of the  diagonal Kevin Reeves.  CIRCUMFLEX ARTERY: The circumflex artery gave rise to a ramus Kevin Reeves and  two marginal  branches and the posterolateral branches. The ramus Kevin Reeves was  completed occluded near its ostium within the stent. There was __________  ostial stenosis of the first marginal Kevin Reeves.  RIGHT CORONARY ARTERY: The right coronary artery is a moderate size vessel  that gave rise to a right ventricular Kevin Reeves, posterior descending Kevin Reeves  and two posterolateral branches. There was 30% narrowing in the mid right  coronary artery.  LEFT VENTRICULOGRAM: The left ventriculogram performed in the RAO  projection showed akinesis of the lateral wall. The estimated ejection  fraction was 40-45%. The left ventriculogram performed in the LAO  projection showed akinesis of the posterior and inferolateral wall. The tip  of the apex and septum moved well and the superior portion of the posterior  wall moved well.  Following aspiration thrombectomy and PTCA of the in-stent thrombotic lesion  in the ramus Kevin Reeves of the circumflex artery the stenosis improved from 100%  to 0% and the flow improved from TIMI 0 to TIMI 3 flow. Myocardial blush   improved from grade 0 to grade 2.  The patient had the onset of chest pain at 12:35 and arrived [facility]  emergency room at 12:42. The first ECG was obtained at 12:50 and this was  not diagnostic. The second ECG at 1325 was diagnostic for a posterior  infarction. The patient arrived at the [facility] at 1510 and  the reperfusion was established with a diver catheter at 1552. This gave a  __________ time from the time of the diagnostic ECG of 2 hours and 27  minutes and a refusion time of 2 hours and 27 minutes.  CONCLUSION:  1. Acute posterior wall myocardial infarction due to stent thrombosis  within the stent in the ramus Kevin Reeves of circumflex artery with a total  occlusion of the ramus Kevin Reeves of the circumflex artery, 70% narrowing at  the ostium of the first marginal Kevin Reeves of the circumflex artery, 70%  narrowing of the proximal LAD with 90% narrowing in the large diagonal  Kevin Reeves, 30% narrowing in the mid right coronary and posterior and  inferolateral wall akinesis with an estimated ejection fraction of 40-  45%.  2. Successful aspiration thrombectomy and PTCA for in-stent thrombosis of  the ramus Kevin Reeves of the circumflex artery with improvement in central  narrowing from 100% to 0%, improvement of flow from TIMI 0 to TIMI 3  flow.    03/2017 cath  Mid RCA lesion, 80 %stenosed.  Dist RCA lesion, 60 %stenosed.  Ost Cx to Prox Cx lesion, 99 %stenosed.  Ramus-2 lesion, 95 %stenosed.  Ramus-1 lesion, 10 %stenosed.  Ost 1st Mrg to 1st Mrg lesion, 80 %stenosed.  Ost 2nd Mrg to 2nd Mrg lesion, 90 %stenosed.  Ost 2nd Diag to 2nd Diag lesion, 99 %stenosed.  Prox LAD to Mid LAD lesion, 90 %stenosed.  Mid LAD lesion, 80 %stenosed.  Dist LAD lesion, 70 %stenosed.  The left ventricular systolic function is normal.  LV end diastolic pressure is normal.  The left ventricular ejection fraction is 50-55% by visual estimate.  There  is no mitral valve regurgitation.  1. Severe triple vessel CAD 2. Preserved LV systolic function  Recommendations: Will consult CT surgery for CABG. He has been on Plavix. Will need Plavix washout prior to surgery. He may be able to go home tomorrow if CABG is planned for next week.     Assessment and Plan  1. CAD -. Committed to lifelong DAPT - no recent symptoms,  continue current meds - EKG today shows NSR, no acute ischemic changes  2. HTN - more recent issues with low bp's, bp's meds have been stopped - normal bp today, continue to monitor.   3. Carotid stenosis - continue regular follow up with vascular - continue medical therapy  4. Hyperlipidemia -Prior elevated LFTs on more potent statin - continue current statin, request pcp labs   F/u 6 months      Antoine Poche, M.D.

## 2018-07-13 NOTE — Patient Instructions (Signed)
Medication Instructions:  Your physician recommends that you continue on your current medications as directed. Please refer to the Current Medication list given to you today.  If you need a refill on your cardiac medications before your next appointment, please call your pharmacy.   Lab work: NONE   If you have labs (blood work) drawn today and your tests are completely normal, you will receive your results only by: . MyChart Message (if you have MyChart) OR . A paper copy in the mail If you have any lab test that is abnormal or we need to change your treatment, we will call you to review the results.  Testing/Procedures: NONE   Follow-Up: At [facility], you and your health needs are our priority.  As part of our continuing mission to provide you with exceptional heart care, we have created designated Provider Care Teams.  These Care Teams include your primary Cardiologist (physician) and Advanced Practice Providers (APPs -  Physician Assistants and Nurse Practitioners) who all work together to provide you with the care you need, when you need it. You will need a follow up appointment in 6 months.  Please call our office 2 months in advance to schedule this appointment.  You may see Branch, Jonathan, MD or one of the following Advanced Practice Providers on your designated Care Team:   Brittany Strader, PA-C ([place] Office) . Michele Lenze, PA-C ([place] Office)  Any Other Special Instructions Will Be Listed Below (If Applicable). Thank you for choosing [facility]!     

## 2018-07-16 DIAGNOSIS — Z008 Encounter for other general examination: Secondary | ICD-10-CM | POA: Diagnosis not present

## 2018-07-20 NOTE — Progress Notes (Signed)
HISTORY AND PHYSICAL     CC:  follow up. Requesting Provider:  Gareth MorganKnowlton, Steve, MD  HPI: This is a 78 y.o. male here for follow up for carotid artery stenosis.  Pt is s/p right CEA by Dr. Hart RochesterLawson October 2007.  Pt was last seen 01/19/18 by NP and at that time, he denied any stroke sx or claudication sx with walking, but having trouble with pain in his joints.  He has hx of CABG in October 2018.    He states he has been doing well since his last visit.  He denies any amaurosis fugax, facial droop, speech difficulties, clumsiness or hemiparesis.  He states he does have some pain in his left hip with walking.  He denies any claudication sx, non healing wounds on his feet.   The pt is on a statin for cholesterol management.  The pt is not diabetic.   The pt is not on medication for hypertension.   Tobacco hx:  Never smoked The pt is on a daily aspirin.  Pt meds includes: Statin:  Yes.   Beta Blocker:  No. Aspirin:  Yes. ACEI:  No. ARB:  No. CCB use:  No Other Antiplatelet/Anticoagulant:  No   Past Medical History:  Diagnosis Date  . Arteriosclerotic cardiovascular disease (ASCVD)    Stent to RI in 2004. Posterior MI in 4/07 with thrombosis of RI at stent site while off Plavix --> reintervention; do not d/c plavix  . Arthritis   . Cerebrovascular disease    Left carotid bruit with 40-59% ICA stenosis; right carotid endarectomy in 2008  . Cholelithiasis 07/21/2009   Asymptomatic  . Colonic polyp 1999   Adenoma resected in 1999; history of diverticulosis  . Coronary artery disease   . Gastroesophageal reflux disease    Dilation of esophagel stricture in 2008  . Hyperlipidemia   . Hypertension   . Myocardial infarction [facility](HCC) 09/2005    Past Surgical History:  Procedure Laterality Date  . CARDIAC CATHETERIZATION     cardiac stent placed  . CAROTID ENDARTERECTOMY  2008   Right  . CHOLECYSTECTOMY N/A 03/28/2014   Procedure: LAPAROSCOPIC CHOLECYSTECTOMY;  Surgeon: Dalia HeadingMark A  Jenkins, MD;  Location: [facility];  Service: General;  Laterality: N/A;  . COLONOSCOPY  2008   adenoma resected in 1999; no intervention in 2008  . COLONOSCOPY WITH ESOPHAGOGASTRODUODENOSCOPY (EGD)  Feb 2010   Dr. Jena Gaussourk: mild erosive reflux esophagitis, non-critical Schatzki's ring without dilation, moderate sized hiatal hernia, bulbar erosions. Negative H.pylori serology. Colonoscopy with normal rectum, pancolonic diverticula, needs surveillance every 5 years due to history of adenomas.   . CORONARY ARTERY BYPASS GRAFT N/A 04/24/2017   Procedure: CORONARY ARTERY BYPASS GRAFTING (CABG)x3 using left internal mammary artery and left greater saphenous vein, bilateral saphenous vein harvested endoscopically;  Surgeon: Alleen BorneBartle, Bryan K, MD;  Location: [facility] OR;  Service: Open Heart Surgery;  Laterality: N/A;  . LEFT HEART CATH AND CORONARY ANGIOGRAPHY N/A 04/19/2017   Procedure: LEFT HEART CATH AND CORONARY ANGIOGRAPHY;  Surgeon: Kathleene HazelMcAlhany, Christopher D, MD;  Location: [facility];  Service: Cardiovascular;  Laterality: N/A;  . TEE WITHOUT CARDIOVERSION N/A 04/24/2017   Procedure: TRANSESOPHAGEAL ECHOCARDIOGRAM (TEE);  Surgeon: Alleen BorneBartle, Bryan K, MD;  [facility];  Service: Open Heart Surgery;  Laterality: N/A;    No Known Allergies  Current Outpatient Medications  Medication Sig Dispense Refill  . aspirin EC 81 MG tablet Take 81 mg by mouth every evening.     . clopidogrel (PLAVIX)  75 MG tablet Take 75 mg by mouth daily.    Kevin Reeves. Krill Oil 300 MG CAPS Take 1 capsule by mouth daily.     . Multiple Vitamin (MULTIVITAMIN) tablet Take 1 tablet by mouth daily.    Marland Kitchen. omeprazole (PRILOSEC) 20 MG capsule Take 20 mg by mouth daily.    . simvastatin (ZOCOR) 40 MG tablet Take 1 tablet (40 mg total) by mouth daily at 6 PM. 30 tablet 1   No current facility-administered medications for this visit.     Family History  Problem Relation Age of Onset  . Heart disease Father        Before age 78  .  Hyperlipidemia Father   . Hypertension Father   . Heart disease Brother   . Heart attack Brother   . Heart disease Brother        Before age 78-  Quad-BPG  . Hyperlipidemia Brother   . Hypertension Brother   . Heart attack Brother   . Stroke Other        Grandparent  . Heart attack Other        Grandparent  . Heart block Other        Brother  . Heart disease Paternal Grandmother   . Heart disease Paternal Grandfather   . Colon cancer Neg Hx     Social History   Socioeconomic History  . Marital status: Married    Spouse name: Not on file  . Number of children: Not on file  . Years of education: Not on file  . Highest education level: Not on file  Occupational History  . Occupation: [employer]    Comment: Part time for 50 years-working 3 days per week, now retired June 2015.   Social Needs  . Financial resource strain: Not on file  . Food insecurity:    Worry: Not on file    Inability: Not on file  . Transportation needs:    Medical: Not on file    Non-medical: Not on file  Tobacco Use  . Smoking status: Never Smoker  . Smokeless tobacco: Never Used  Substance and Sexual Activity  . Alcohol use: No    Alcohol/week: 0.0 standard drinks  . Drug use: No  . Sexual activity: Yes    Partners: Female    Birth control/protection: None  Lifestyle  . Physical activity:    Days per week: Not on file    Minutes per session: Not on file  . Stress: Not on file  Relationships  . Social connections:    Talks on phone: Not on file    Gets together: Not on file    Attends religious service: Not on file    Active member of club or organization: Not on file    Attends meetings of clubs or organizations: Not on file    Relationship status: Not on file  . Intimate partner violence:    Fear of current or ex partner: Not on file    Emotionally abused: Not on file    Physically abused: Not on file    Forced sexual activity: Not on file  Other Topics Concern  . Not  on file  Social History Narrative   Mar[place] and [place] in [place]      REVIEW OF SYSTEMS:   [X]  denotes positive finding, [ ]  denotes negative finding Cardiac  Comments:  Chest pain or chest pressure:    Shortness of breath upon exertion:    Short  of breath when lying flat:    Irregular heart rhythm:        Vascular    Pain in calf, thigh, or hip brought on by ambulation: x hip  Pain in feet at night that wakes you up from your sleep:     Blood clot in your veins:    Leg swelling:         Pulmonary    Oxygen at home:    Productive cough:     Wheezing:         Neurologic    Sudden weakness in arms or legs:     Sudden numbness in arms or legs:     Sudden onset of difficulty speaking or slurred speech:    Temporary loss of vision in one eye:     Problems with dizziness:         Gastrointestinal    Blood in stool:     Vomited blood:         Genitourinary    Burning when urinating:     Blood in urine:        Psychiatric    Major depression:         Hematologic    Bleeding problems:    Problems with blood clotting too easily:        Skin    Rashes or ulcers:        Constitutional    Fever or chills:      PHYSICAL EXAMINATION:  Today's Vitals   07/23/18 1020 07/23/18 1024  BP: 117/69 132/70  Pulse: 69 69  Resp: 18   Temp: (!) 96.9 F (36.1 C)   TempSrc: Oral   SpO2: 96%   Weight: 198 lb (89.8 kg)   Height: 5\' 8"  (1.727 m)    Body mass index is 30.11 kg/m.   General:  WDWN in NAD; vital signs documented above Gait: Not observed HENT: WNL, normocephalic Pulmonary: normal non-labored breathing , without Rales, rhonchi,  wheezing Cardiac: regular HR, without  Murmurs, rubs or gallops; without carotid bruits Abdomen: soft, NT, no masses Skin: without rashes Vascular Exam/Pulses:  Right Left  Radial 2+ (normal) 2+ (normal)  DP 2+ (normal) 2+ (normal)  PT Unable to palpate  Unable to palpate    Extremities: without ischemic changes,  without Gangrene , without cellulitis; without open wounds;  Musculoskeletal: no muscle wasting or atrophy  Neurologic: A&O X 3 Psychiatric:  The pt has Normal affect.   Non-Invasive Vascular Imaging:   Carotid Duplex on 07/23/2018: Right:  60-70% stenosis Left:  60-79% stenosis Vertebrals:  Left vertebral artery demonstrates antegrade flow. Right vertebral artery demonstrates no discernable flow. Subclavians: Right subclavian artery was stenotic. Normal flow hemodynamics were seen in the left subclavian artery.  ABI's/TBI's 07/23/2018: Right:  0.87/0.29 Left:  0.88/0.53  Previous Carotid duplex on 01/19/18: Right: 40-59% CEA site stenosis Left:   40-59% stenosis Right vertebral artery with no discernable flow, left has antegrade flow. Right subclavian artery is stenotic, left with normal flow hemodynamics.  Velocities seem less in the bilateral ICA compared to the exam on 06-07-17. Inability to locate right vertebral artery flow is new compared to previous exam.  ABI's 06/07/17:  R:  ? ABI:0.88(was 0.87 on 12-06-16),  ? MB:WG(YKZ mono) ? LD:JT(TSV mono) ? TBI:0.32 (was 0.46)  L:  ? ABI:0.95(was 1.01),  ? XB:LT(JQZ bi) ? ES:PQ(ZRA bi) ? TBI:0.46 (was 0.69) ? Stable bilateralABI, all biphasic waveforms. Mild disease on the right, no significant disease  on the left. Decline in bilateral TBI.   ASSESSMENT/PLAN:: 78 y.o. male here for follow up carotid artery stenosis and repeat ABI's.  -pt doing well and remains asymptomatic from his carotid disease.  He did have some progression to the 60-79% stenosis category bilaterally.  Will have him f/u in 6 months with carotid duplex.  Discussed s/s of stroke with pt and his wife and they know to proceed to the ER should he develop any of these sx. -his ABI's are essentially unchanged from his previous exam.  He does not have any claudication sx or non healing wounds.  He will have repeat ABI's in one year.  If he develops  wounds or claudication, he will f/u sooner.  -continue statin/asa   Doreatha Massed, PA-C [facility] [phone]  Clinic MD:  Myra Gianotti

## 2018-07-23 ENCOUNTER — Ambulatory Visit (HOSPITAL_COMMUNITY)
Admission: RE | Admit: 2018-07-23 | Discharge: 2018-07-23 | Disposition: A | Discharge status: Home / Self Care | Payer: Medicare HMO | Source: Ambulatory Visit | Attending: Family | Admitting: Family

## 2018-07-23 ENCOUNTER — Other Ambulatory Visit: Payer: Self-pay

## 2018-07-23 ENCOUNTER — Ambulatory Visit (INDEPENDENT_AMBULATORY_CARE_PROVIDER_SITE_OTHER)
Admission: RE | Admit: 2018-07-23 | Discharge: 2018-07-23 | Disposition: A | Discharge status: Home / Self Care | Payer: Medicare HMO | Source: Ambulatory Visit | Attending: Family | Admitting: Family

## 2018-07-23 ENCOUNTER — Ambulatory Visit: Payer: Medicare HMO | Admitting: Physician Assistant

## 2018-07-23 VITALS — BP 132/70 | HR 69 | Temp 96.9°F | Resp 18 | Ht 68.0 in | Wt 198.0 lb

## 2018-07-23 DIAGNOSIS — I779 Disorder of arteries and arterioles, unspecified: Secondary | ICD-10-CM | POA: Diagnosis not present

## 2018-07-23 DIAGNOSIS — I6523 Occlusion and stenosis of bilateral carotid arteries: Secondary | ICD-10-CM

## 2018-07-23 DIAGNOSIS — R0989 Other specified symptoms and signs involving the circulatory and respiratory systems: Secondary | ICD-10-CM | POA: Diagnosis present

## 2019-01-15 ENCOUNTER — Other Ambulatory Visit: Payer: Self-pay

## 2019-01-15 DIAGNOSIS — I6523 Occlusion and stenosis of bilateral carotid arteries: Secondary | ICD-10-CM

## 2019-01-18 ENCOUNTER — Telehealth (HOSPITAL_COMMUNITY): Payer: Self-pay

## 2019-01-18 NOTE — Telephone Encounter (Signed)
  Left voicemail for patient re: COVID guidelines and appointment Monday morning.

## 2019-01-21 ENCOUNTER — Encounter: Payer: Self-pay | Admitting: Family

## 2019-01-21 ENCOUNTER — Ambulatory Visit: Payer: Medicare HMO | Admitting: Family

## 2019-01-21 ENCOUNTER — Other Ambulatory Visit: Payer: Self-pay

## 2019-01-21 ENCOUNTER — Ambulatory Visit (HOSPITAL_COMMUNITY)
Admission: RE | Admit: 2019-01-21 | Discharge: 2019-01-21 | Disposition: A | Discharge status: Home / Self Care | Payer: Medicare HMO | Source: Ambulatory Visit | Attending: Family | Admitting: Family

## 2019-01-21 VITALS — BP 136/72 | HR 69 | Temp 97.0°F | Resp 16 | Ht 68.0 in | Wt 190.0 lb

## 2019-01-21 DIAGNOSIS — I779 Disorder of arteries and arterioles, unspecified: Secondary | ICD-10-CM | POA: Diagnosis not present

## 2019-01-21 DIAGNOSIS — Z9889 Other specified postprocedural states: Secondary | ICD-10-CM | POA: Diagnosis not present

## 2019-01-21 DIAGNOSIS — I6523 Occlusion and stenosis of bilateral carotid arteries: Secondary | ICD-10-CM | POA: Insufficient documentation

## 2019-01-21 NOTE — Progress Notes (Signed)
Chief Complaint: Follow up Extracranial Carotid Artery Stenosis   History of Present Illness  Kevin Reeves is a 78 y.o. male who is status post right CEA in October 2007by Dr. Eugenie Filler returns today for follow up.  He has no history of TIA or stroke symptoms, specificallyhedenies a history of amaurosis fugax or monocular blindness, unilateralfacial drooping, hemiplegia, or receptive or expressive aphasia.   He denies claudication type symptoms in his legs with walking. He has worsening trouble walking, states he pain in his joints, denies radiculopathy type pain.    He denies any chest pain or dyspnea, denies fever or chills.   He had 3 vessel CABG in October 2018. He was having dyspnea and had a cardiac cath.  Cholecystectomy, March 28, 2014.  He was last evaluated on 07-23-18 by S. Rhyne PA-C. At that time he was doing well and remained asymptomatic from his carotid disease.  He had some progression to the 60-79% stenosis category bilaterally.  He was to f/u in 6 months with carotid duplex.   His ABI's were essentially unchanged from his previous exam.  He did not have any claudication sx or non healing wounds.  Repeat ABI's in one year.  If he develops wounds or claudication, he will f/u sooner.    Diabetic: no Tobacco use: non-smoker  Pt meds include: Statin : yes ASA: yes Other anticoagulants/antiplatelets: Plavix   Past Medical History:  Diagnosis Date  . Arteriosclerotic cardiovascular disease (ASCVD)    Stent to RI in 2004. Posterior MI in 4/07 with thrombosis of RI at stent site while off Plavix --> reintervention; do not d/c plavix  . Arthritis   . Cerebrovascular disease    Left carotid bruit with 40-59% ICA stenosis; right carotid endarectomy in 2008  . Cholelithiasis 07/21/2009   Asymptomatic  . Colonic polyp 1999   Adenoma resected in 1999; history of diverticulosis  . Coronary artery disease   . Gastroesophageal reflux disease    Dilation  of esophagel stricture in 2008  . Hyperlipidemia   . Hypertension   . Myocardial infarction [facility]) 09/2005    Social History Social History   Tobacco Use  . Smoking status: Never Smoker  . Smokeless tobacco: Never Used  Substance Use Topics  . Alcohol use: No    Alcohol/week: 0.0 standard drinks  . Drug use: No    Family History Family History  Problem Relation Age of Onset  . Heart disease Father        Before age 37  . Hyperlipidemia Father   . Hypertension Father   . Heart disease Brother   . Heart attack Brother   . Heart disease Brother        Before age 63-  38  . Hyperlipidemia Brother   . Hypertension Brother   . Heart attack Brother   . Stroke Other        Grandparent  . Heart attack Other        Grandparent  . Heart block Other        Brother  . Heart disease Paternal Grandmother   . Heart disease Paternal Grandfather   . Colon cancer Neg Hx     Surgical History Past Surgical History:  Procedure Laterality Date  . CARDIAC CATHETERIZATION     cardiac stent placed  . CAROTID ENDARTERECTOMY  2008   Right  . CHOLECYSTECTOMY N/A 03/28/2014   Procedure: LAPAROSCOPIC CHOLECYSTECTOMY;  Surgeon: Jamesetta So, MD;  Location: [facility];  Service:  General;  Laterality: N/A;  . COLONOSCOPY  2008   adenoma resected in 1999; no intervention in 2008  . COLONOSCOPY WITH ESOPHAGOGASTRODUODENOSCOPY (EGD)  Feb 2010   Dr. Jena Gauss: mild erosive reflux esophagitis, non-critical Schatzki's ring without dilation, moderate sized hiatal hernia, bulbar erosions. Negative H.pylori serology. Colonoscopy with normal rectum, pancolonic diverticula, needs surveillance every 5 years due to history of adenomas.   . CORONARY ARTERY BYPASS GRAFT N/A 04/24/2017   Procedure: CORONARY ARTERY BYPASS GRAFTING (CABG)x3 using left internal mammary artery and left greater saphenous vein, bilateral saphenous vein harvested endoscopically;  Surgeon: Alleen Borne, MD;  Location: [facility];   Service: Open Heart Surgery;  Laterality: N/A;  . LEFT HEART CATH AND CORONARY ANGIOGRAPHY N/A 04/19/2017   Procedure: LEFT HEART CATH AND CORONARY ANGIOGRAPHY;  Surgeon: Kathleene Hazel, MD;  Location: [facility];  Service: Cardiovascular;  Laterality: N/A;  . TEE WITHOUT CARDIOVERSION N/A 04/24/2017   Procedure: TRANSESOPHAGEAL ECHOCARDIOGRAM (TEE);  Surgeon: Alleen Borne, MD;  Location: [facility] OR;  Service: Open Heart Surgery;  Laterality: N/A;    No Known Allergies  Current Outpatient Medications  Medication Sig Dispense Refill  . aspirin EC 81 MG tablet Take 81 mg by mouth every evening.     . clopidogrel (PLAVIX) 75 MG tablet Take 75 mg by mouth daily.    Boris Lown Oil 300 MG CAPS Take 1 capsule by mouth daily.     . Multiple Vitamin (MULTIVITAMIN) tablet Take 1 tablet by mouth daily.    Marland Kitchen omeprazole (PRILOSEC) 20 MG capsule Take 20 mg by mouth daily.    . simvastatin (ZOCOR) 40 MG tablet Take 1 tablet (40 mg total) by mouth daily at 6 PM. 30 tablet 1   No current facility-administered medications for this visit.     Review of Systems : See HPI for pertinent positives and negatives.  Physical Examination  Vitals:   01/21/19 1025 01/21/19 1029  BP: 131/73 136/72  Pulse: 69 69  Resp: 16   Temp: (!) 97 F (36.1 C)   TempSrc: Temporal   SpO2: 98%   Weight: 190 lb (86.2 kg)   Height: 5\' 8"  (1.727 m)    Body mass index is 28.89 kg/m.  General: WDWN male in NAD GAIT: normal Eyes: PERRLA HENT: No gross abnormalities.  Pulmonary:  Respirations are non-labored, good air movement in all fields, CTAB, no rales,  rhonchi, or wheezing. Cardiac: regular rhythm, no detected murmur.  VASCULAR EXAM Carotid Bruits Right Left   Negative Negative     Abdominal aortic pulse is not palpable. Radial pulses are 2+ palpable and equal.                                                                                                                            LE Pulses  Right Left       FEMORAL  2+ palpable  2+ palpable        POPLITEAL  not palpable   not palpable       POSTERIOR TIBIAL  not palpable   not palpable        DORSALIS PEDIS      ANTERIOR TIBIAL not palpable  faintly palpable    Gastrointestinal: soft, nontender, BS WNL, no r/g, no palpable masses. Musculoskeletal: no muscle atrophy/wasting. M/S 5/5 throughout, extremities without ischemic changes Skin: No rashes, no ulcers, no cellulitis.   Neurologic:  A&O X 3; appropriate affect, sensation is normal; speech is normal, CN 2-12 intact, pain and light touch intact in extremities, motor exam as listed above. Psychiatric: Normal thought content, mood appropriate to clinical situation.     DATA  Carotid Duplex (01-21-19): Right ICA: 40-59% stenosis Left ICA: 60-79% stenosis Right vertebral artery demonstrates an occlusion; left vertebral artery demonstrates normal antegrade flow.  Normal flow dynamics were seen in the bilateral subclavian arteries.  Decreased stenosis in the right ICA, stable in the left, compared to the exam on 07-23-18.    ABI(Date:07-23-18): ABI Findings: +---------+------------------+-----+----------+--------+ Right    Rt Pressure (mmHg)IndexWaveform  Comment  +---------+------------------+-----+----------+--------+ Brachial 137                                       +---------+------------------+-----+----------+--------+ PTA      103               0.75 monophasic         +---------+------------------+-----+----------+--------+ DP       119               0.87 biphasic           +---------+------------------+-----+----------+--------+ Great Toe40                0.29 Abnormal           +---------+------------------+-----+----------+--------+  +---------+------------------+-----+--------+-------+ Left     Lt Pressure (mmHg)IndexWaveformComment +---------+------------------+-----+--------+-------+ Brachial 136                                     +---------+------------------+-----+--------+-------+ PTA      105               0.77 biphasic        +---------+------------------+-----+--------+-------+ DP       120               0.88 biphasic        +---------+------------------+-----+--------+-------+ Great Toe73                0.53 Abnormal        +---------+------------------+-----+--------+-------+  +-------+-----------+-----------+------------+------------+ ABI/TBIToday's ABIToday's TBIPrevious ABIPrevious TBI +-------+-----------+-----------+------------+------------+ Right  0.87       0.29       0.88        0.32         +-------+-----------+-----------+------------+------------+ Left   0.88       0.53       0.95        0.46         +-------+-----------+-----------+------------+------------+  Right ABIs and TBIs appear essentially unchanged compared to prior study on 06/07/2017. Left ABIs appear decreased compared to prior study on 06/07/2017. Left TBIs appear slightly increased when compared to previous study.   Summary: Right: Resting right ankle-brachial index indicates mild right lower extremity arterial disease. The right toe-brachial index is abnormal. RT great  toe pressure = 40 mmHg. PPG tracings appear dampened. Left: Resting left ankle-brachial index indicates mild left lower extremity arterial disease. The left toe-brachial index is abnormal. LT Great toe pressure = 73 mmHg. PPG tracings appear dampened.   Assessment: Caroline SaugerReuben M Jumper is a 78 y.o. male whois status post right CEA in October 2007. He has no history of stroke or TIA but does have a history of CAD. Carotid duplex today shows decreased stenosis in the right ICA, stable in the left, compared to the exam on 07-23-18.    His pedal pulses are not palpable, he does not describe classic claudication symptoms, there are no signs of ischemia in his feet/legs.  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  He takes a daily ASA, Plavix, and a statin.    Plan: Follow-up in 6 months with Carotid Duplex scan and ABI's.  Continue walking at least 30 minutes total daily in a safe environment.  Continue the statin and ASA.   I discussed in depth with the patient the nature of atherosclerosis, and emphasized the importance of maximal medical management including strict control of blood pressure, blood glucose, and lipid levels, obtaining regular exercise, and continued cessation of smoking.  The patient is aware that without maximal medical management the underlying atherosclerotic disease process will progress, limiting the benefit of any interventions. The patient was given information about stroke prevention and what symptoms should prompt the patient to seek immediate medical care. Thank you for allowing us to participate in this patient's care.  Charisse MarchSuzanne Nickel, RN, MSN, FNP-C [facility] [place] Office: [phone]  Clinic Physician: Darrick PennaFields on call  01/21/19 10:36 AM

## 2019-01-21 NOTE — Patient Instructions (Signed)

## 2019-01-25 ENCOUNTER — Telehealth: Payer: Self-pay | Admitting: Cardiology

## 2019-01-25 NOTE — Telephone Encounter (Signed)

## 2019-01-30 DIAGNOSIS — I2510 Atherosclerotic heart disease of native coronary artery without angina pectoris: Secondary | ICD-10-CM | POA: Diagnosis not present

## 2019-01-30 DIAGNOSIS — K210 Gastro-esophageal reflux disease with esophagitis: Secondary | ICD-10-CM | POA: Diagnosis not present

## 2019-01-30 DIAGNOSIS — E785 Hyperlipidemia, unspecified: Secondary | ICD-10-CM | POA: Diagnosis not present

## 2019-01-30 DIAGNOSIS — I779 Disorder of arteries and arterioles, unspecified: Secondary | ICD-10-CM | POA: Diagnosis not present

## 2019-01-31 ENCOUNTER — Telehealth: Payer: Medicare HMO | Admitting: Cardiology

## 2019-02-12 DIAGNOSIS — E785 Hyperlipidemia, unspecified: Secondary | ICD-10-CM | POA: Diagnosis not present

## 2019-02-12 DIAGNOSIS — R7309 Other abnormal glucose: Secondary | ICD-10-CM | POA: Diagnosis not present

## 2019-02-12 DIAGNOSIS — I10 Essential (primary) hypertension: Secondary | ICD-10-CM | POA: Diagnosis not present

## 2019-02-12 DIAGNOSIS — I2510 Atherosclerotic heart disease of native coronary artery without angina pectoris: Secondary | ICD-10-CM | POA: Diagnosis not present

## 2019-02-12 DIAGNOSIS — Z125 Encounter for screening for malignant neoplasm of prostate: Secondary | ICD-10-CM | POA: Diagnosis not present

## 2019-03-22 DIAGNOSIS — R69 Illness, unspecified: Secondary | ICD-10-CM | POA: Diagnosis not present

## 2019-04-01 DIAGNOSIS — Z0100 Encounter for examination of eyes and vision without abnormal findings: Secondary | ICD-10-CM | POA: Diagnosis not present

## 2019-04-01 DIAGNOSIS — H5200 Hypermetropia, unspecified eye: Secondary | ICD-10-CM | POA: Diagnosis not present

## 2019-04-10 DIAGNOSIS — M65872 Other synovitis and tenosynovitis, left ankle and foot: Secondary | ICD-10-CM | POA: Diagnosis not present

## 2019-04-10 DIAGNOSIS — M79672 Pain in left foot: Secondary | ICD-10-CM | POA: Diagnosis not present

## 2019-04-10 DIAGNOSIS — M79674 Pain in right toe(s): Secondary | ICD-10-CM | POA: Diagnosis not present

## 2019-06-05 DIAGNOSIS — I10 Essential (primary) hypertension: Secondary | ICD-10-CM | POA: Diagnosis not present

## 2019-06-05 DIAGNOSIS — Z8261 Family history of arthritis: Secondary | ICD-10-CM | POA: Diagnosis not present

## 2019-06-05 DIAGNOSIS — Z7189 Other specified counseling: Secondary | ICD-10-CM | POA: Diagnosis not present

## 2019-06-05 DIAGNOSIS — E785 Hyperlipidemia, unspecified: Secondary | ICD-10-CM | POA: Diagnosis not present

## 2019-06-24 DIAGNOSIS — M069 Rheumatoid arthritis, unspecified: Secondary | ICD-10-CM | POA: Diagnosis not present

## 2019-06-28 DIAGNOSIS — M069 Rheumatoid arthritis, unspecified: Secondary | ICD-10-CM | POA: Diagnosis not present

## 2019-07-16 DIAGNOSIS — M069 Rheumatoid arthritis, unspecified: Secondary | ICD-10-CM | POA: Diagnosis not present

## 2019-07-16 DIAGNOSIS — Z79899 Other long term (current) drug therapy: Secondary | ICD-10-CM | POA: Diagnosis not present

## 2019-07-31 ENCOUNTER — Other Ambulatory Visit: Payer: Self-pay

## 2019-07-31 ENCOUNTER — Ambulatory Visit (HOSPITAL_COMMUNITY)
Admission: RE | Admit: 2019-07-31 | Discharge: 2019-07-31 | Disposition: A | Discharge status: Home / Self Care | Payer: Medicare HMO | Source: Ambulatory Visit | Attending: Vascular Surgery | Admitting: Vascular Surgery

## 2019-07-31 ENCOUNTER — Ambulatory Visit (INDEPENDENT_AMBULATORY_CARE_PROVIDER_SITE_OTHER)
Admission: RE | Admit: 2019-07-31 | Discharge: 2019-07-31 | Disposition: A | Discharge status: Home / Self Care | Payer: Medicare HMO | Source: Ambulatory Visit | Attending: Vascular Surgery | Admitting: Vascular Surgery

## 2019-07-31 DIAGNOSIS — I779 Disorder of arteries and arterioles, unspecified: Secondary | ICD-10-CM | POA: Diagnosis not present

## 2019-08-06 DIAGNOSIS — E785 Hyperlipidemia, unspecified: Secondary | ICD-10-CM | POA: Diagnosis not present

## 2019-08-06 DIAGNOSIS — I2510 Atherosclerotic heart disease of native coronary artery without angina pectoris: Secondary | ICD-10-CM | POA: Diagnosis not present

## 2019-08-06 DIAGNOSIS — I10 Essential (primary) hypertension: Secondary | ICD-10-CM | POA: Diagnosis not present

## 2019-08-06 DIAGNOSIS — R7309 Other abnormal glucose: Secondary | ICD-10-CM | POA: Diagnosis not present

## 2019-08-06 DIAGNOSIS — K219 Gastro-esophageal reflux disease without esophagitis: Secondary | ICD-10-CM | POA: Diagnosis not present

## 2019-08-06 DIAGNOSIS — M069 Rheumatoid arthritis, unspecified: Secondary | ICD-10-CM | POA: Diagnosis not present

## 2019-08-06 DIAGNOSIS — Z125 Encounter for screening for malignant neoplasm of prostate: Secondary | ICD-10-CM | POA: Diagnosis not present

## 2019-08-06 DIAGNOSIS — Z7901 Long term (current) use of anticoagulants: Secondary | ICD-10-CM | POA: Diagnosis not present

## 2019-08-06 DIAGNOSIS — Z79899 Other long term (current) drug therapy: Secondary | ICD-10-CM | POA: Diagnosis not present

## 2019-08-06 DIAGNOSIS — E663 Overweight: Secondary | ICD-10-CM | POA: Diagnosis not present

## 2019-08-06 NOTE — Progress Notes (Signed)
Virtual Visit via Telephone Note   I connected with Kevin Reeves on 08/07/2019 by telephone and verified that I was speaking with the correct person using two identifiers. Patient was located at home and accompanied by his wife of 15 years.  I am located at [facility] office.   The limitations of evaluation and management by telemedicine and the availability of in person appointments have been previously discussed with the patient and are documented in the patients chart. The patient expressed understanding and consented to proceed.  PCP: Gareth Morgan, MD  Chief Complaint: follow up  History of Present Illness: Kevin HEDEEN is a 79 y.o. male here for follow up for carotid artery stenosis.  Pt is s/p right CEA by Dr. Hart Rochester in October 2007.  Pt was last seen 01/21/2019 by NP.  At that time, the pt had no sx of TIA/CVA.   He did not have claudication but did have worsening trouble walking due to pain in his joints.   His ABI's a year ago were essentially unchanged from previous visit.   Pt has hx of CABG x 3 in 2018.    I spoke with the pt today by telephone.  He states he is doing well.  He just got back in from walking about 1/2 mile with the dogs.  He does not have any pain or cramping with walking and denies any non healing wounds.     He states that he has not had any unilateral numbness, weakness, clumsiness or paralysis.  He denies amaurosis fugax or facial droop.    The pt is on a statin for cholesterol management.  The pt is on a daily aspirin.   Other AC:  Plavix The pt is not on meds for hypertension.   The pt is not diabetic.   Tobacco hx:  never   Past Medical History:  Diagnosis Date  . Arteriosclerotic cardiovascular disease (ASCVD)    Stent to RI in 2004. Posterior MI in 4/07 with thrombosis of RI at stent site while off Plavix --> reintervention; do not d/c plavix  . Arthritis   . Cerebrovascular disease    Left carotid bruit with 40-59% ICA stenosis; right  carotid endarectomy in 2008  . Cholelithiasis 07/21/2009   Asymptomatic  . Colonic polyp 1999   Adenoma resected in 1999; history of diverticulosis  . Coronary artery disease   . Gastroesophageal reflux disease    Dilation of esophagel stricture in 2008  . Hyperlipidemia   . Hypertension   . Myocardial infarction [facility]) 09/2005    Past Surgical History:  Procedure Laterality Date  . CARDIAC CATHETERIZATION     cardiac stent placed  . CAROTID ENDARTERECTOMY  2008   Right  . CHOLECYSTECTOMY N/A 03/28/2014   Procedure: LAPAROSCOPIC CHOLECYSTECTOMY;  Surgeon: Dalia Heading, MD;  Location: [facility];  Service: General;  Laterality: N/A;  . COLONOSCOPY  2008   adenoma resected in 1999; no intervention in 2008  . COLONOSCOPY WITH ESOPHAGOGASTRODUODENOSCOPY (EGD)  Feb 2010   Dr. Jena Gauss: mild erosive reflux esophagitis, non-critical Schatzki's ring without dilation, moderate sized hiatal hernia, bulbar erosions. Negative H.pylori serology. Colonoscopy with normal rectum, pancolonic diverticula, needs surveillance every 5 years due to history of adenomas.   . CORONARY ARTERY BYPASS GRAFT N/A 04/24/2017   Procedure: CORONARY ARTERY BYPASS GRAFTING (CABG)x3 using left internal mammary artery and left greater saphenous vein, bilateral saphenous vein harvested endoscopically;  Surgeon: Alleen Borne, MD;  Location: [facility];  Service:  Open Heart Surgery;  Laterality: N/A;  . LEFT HEART CATH AND CORONARY ANGIOGRAPHY N/A 04/19/2017   Procedure: LEFT HEART CATH AND CORONARY ANGIOGRAPHY;  Surgeon: Burnell Blanks, MD;  Location: [place] [facility];  Service: Cardiovascular;  Laterality: N/A;  . TEE WITHOUT CARDIOVERSION N/A 04/24/2017   Procedure: TRANSESOPHAGEAL ECHOCARDIOGRAM (TEE);  Surgeon: Gaye Pollack, MD;  Location: [place];  Service: Open Heart Surgery;  Laterality: N/A;    shoulder  Current Meds  Medication Sig  . aspirin EC 81 MG tablet Take 81 mg by mouth every evening.   .  clopidogrel (PLAVIX) 75 MG tablet Take 75 mg by mouth daily.  Javier Docker Oil 300 MG CAPS Take 1 capsule by mouth daily.   . Multiple Vitamin (MULTIVITAMIN) tablet Take 1 tablet by mouth daily.  Marland Kitchen omeprazole (PRILOSEC) 20 MG capsule Take 20 mg by mouth daily.  . simvastatin (ZOCOR) 40 MG tablet Take 1 tablet (40 mg total) by mouth daily at 6 PM.    12 system ROS was negative unless otherwise noted in HPI   Observations/Objective: Non-Invasive Vascular Imaging:   Carotid Duplex on 07/31/2019: Right:  40-59% ICA stenosis Left:  60-79% ICA stenosis Vertebrals: Left vertebral artery demonstrates antegrade flow. Right  vertebral artery demonstrates an occlusion.  Subclavians: Bilateral subclavian artery flow was disturbed.   ABI/TBI 07/31/2019: Right:  0.66/0.41 Left:  0.66/0.43  Previous Carotid duplex on 01/21/2019: Right: 40-59% ICA stenosis Left:   60-79% ICA stenosis Vertebrals: Left vertebral artery demonstrates antegrade flow. Right  vertebral artery demonstrates an occlusion.  Subclavians: Normal flow hemodynamics were seen in bilateral subclavian arteries.   Previous ABI/TBI 07/23/2018: Right:  0.87/0.29 Left:  0.88/0.53  Assessment and Plan: 79 y.o. male with hx of right CEA by Dr. Kellie Simmering in October 2007 and PAD  -pt remains asymptomatic from carotid standpoint.  His duplex is essentially unchanged from previous.  He knows if he experiences any s/s of TIA or stroke, which were discussed with pt, he is go to the ER.   -his ABI's have decreased from previous exam a year ago.  He does not have claudication or non healing wounds.  He knows that if develops non healing wounds or rest pain, to contact us sooner.  Otherwise, we will see him in 6 months and repeat these studies.    Follow Up Instructions:    I discussed the assessment and treatment plan with the patient. The patient was provided an opportunity to ask questions and all were answered. The patient agreed with the plan and  demonstrated an understanding of the instructions.   The patient was advised to call back or seek an in-person evaluation if the symptoms worsen or if the condition fails to improve as anticipated.  I spent 5 minutes with the patient via telephone encounter.   Signed, Leontine Locket, PA-C [facility] [place]: [phone]  08/07/2019, 1:28 PM

## 2019-08-07 ENCOUNTER — Other Ambulatory Visit: Payer: Self-pay

## 2019-08-07 ENCOUNTER — Ambulatory Visit (INDEPENDENT_AMBULATORY_CARE_PROVIDER_SITE_OTHER): Payer: Medicare HMO | Admitting: Physician Assistant

## 2019-08-07 VITALS — BP 120/80 | Ht 69.0 in | Wt 195.0 lb

## 2019-08-07 DIAGNOSIS — I779 Disorder of arteries and arterioles, unspecified: Secondary | ICD-10-CM | POA: Diagnosis not present

## 2019-08-07 DIAGNOSIS — I6523 Occlusion and stenosis of bilateral carotid arteries: Secondary | ICD-10-CM

## 2019-08-08 ENCOUNTER — Other Ambulatory Visit: Payer: Self-pay | Admitting: *Deleted

## 2019-08-08 DIAGNOSIS — I6523 Occlusion and stenosis of bilateral carotid arteries: Secondary | ICD-10-CM

## 2019-08-08 DIAGNOSIS — I779 Disorder of arteries and arterioles, unspecified: Secondary | ICD-10-CM

## 2019-09-10 DIAGNOSIS — M069 Rheumatoid arthritis, unspecified: Secondary | ICD-10-CM | POA: Diagnosis not present

## 2019-11-04 ENCOUNTER — Encounter: Payer: Self-pay | Admitting: Cardiology

## 2019-11-04 ENCOUNTER — Ambulatory Visit: Payer: Medicare HMO | Admitting: Cardiology

## 2019-11-04 ENCOUNTER — Other Ambulatory Visit: Payer: Self-pay

## 2019-11-04 ENCOUNTER — Encounter: Payer: Self-pay | Admitting: *Deleted

## 2019-11-04 VITALS — BP 138/70 | HR 85 | Temp 98.3°F | Ht 68.0 in | Wt 193.0 lb

## 2019-11-04 DIAGNOSIS — I2510 Atherosclerotic heart disease of native coronary artery without angina pectoris: Secondary | ICD-10-CM

## 2019-11-04 DIAGNOSIS — E782 Mixed hyperlipidemia: Secondary | ICD-10-CM

## 2019-11-04 NOTE — Progress Notes (Signed)
Clinical Summary Mr. Krolak is a 79 y.o.male  seen today for follow up of the following medical problems.   1. CAD/ICM/Chronic systolic HF - prior stenting in 2004 to ramus, 2007 MI with thrombosis of stent while off plavix,has been committed to lifelong plavix.LV gram at that time 40-45% with akinesis of lateral wall.  -Statin stopped by GI doctor due to abnormal LFTs, stopped rampiril due to low bp's.  - cath 03/2017 as reported below, severe multivessel disease. Referred for CABG. - 04/24/17 CABG LIMA-LAD, SVG-Ramus, SVG-RCA. Medical therapy limited postop due to soft bp's which continued at his last clinic f/u. - 03/2017 echo LVEF 55-60%   - no recent chest pain, no SOB or doe -compliant with meds     2. HTN - more recent issues with low bp's, bp meds have been stopped  3. Carotid stenosis - prior right CEA - followed by vascular surgery   4. Hyperlipidemia -. Prior LFT elevation on higher doses -compliant with statin, labs followed by pcp  - labs followed by pcp  5. Rheumatoid arthritis - on methotrexate.   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    Past Medical History:  Diagnosis Date  . Arteriosclerotic cardiovascular disease (ASCVD)    Stent to RI in 2004. Posterior MI in 4/07 with thrombosis of RI at stent site while off Plavix --> reintervention; do not d/c plavix  . Arthritis   . Cerebrovascular disease    Left carotid bruit with 40-59% ICA stenosis; right carotid endarectomy in 2008  . Cholelithiasis 07/21/2009   Asymptomatic  . Colonic polyp 1999   Adenoma resected in 1999; history of diverticulosis  . Coronary artery disease   . Gastroesophageal reflux disease    Dilation of esophagel stricture in 2008  . Hyperlipidemia   . Hypertension   . Myocardial infarction (HCC) 09/2005     No Known Allergies   Current Outpatient Medications  Medication Sig Dispense Refill  . aspirin EC 81 MG tablet  Take 81 mg by mouth every evening.     . clopidogrel (PLAVIX) 75 MG tablet Take 75 mg by mouth daily.    Boris Lown Oil 300 MG CAPS Take 1 capsule by mouth daily.     . Multiple Vitamin (MULTIVITAMIN) tablet Take 1 tablet by mouth daily.    Marland Kitchen omeprazole (PRILOSEC) 20 MG capsule Take 20 mg by mouth daily.    . simvastatin (ZOCOR) 40 MG tablet Take 1 tablet (40 mg total) by mouth daily at 6 PM. 30 tablet 1   No current facility-administered medications for this visit.     Past Surgical History:  Procedure Laterality Date  . CARDIAC CATHETERIZATION     cardiac stent placed  . CAROTID ENDARTERECTOMY  2008   Right  . CHOLECYSTECTOMY N/A 03/28/2014   Procedure: LAPAROSCOPIC CHOLECYSTECTOMY;  Surgeon: Dalia Heading, MD;  Location: [facility];  Service: General;  Laterality: N/A;  . COLONOSCOPY  2008   adenoma resected in 1999; no intervention in 2008  . COLONOSCOPY WITH ESOPHAGOGASTRODUODENOSCOPY (EGD)  Feb 2010   Dr. Jena Gauss: mild erosive reflux esophagitis, non-critical Schatzki's ring without dilation, moderate sized hiatal hernia, bulbar erosions. Negative H.pylori serology. Colonoscopy with normal rectum, pancolonic diverticula, needs surveillance every 5 years due to history of adenomas.   . CORONARY ARTERY BYPASS GRAFT N/A 04/24/2017   Procedure: CORONARY ARTERY BYPASS GRAFTING (CABG)x3 using left internal mammary artery and left greater saphenous vein, bilateral saphenous vein  harvested endoscopically;  Surgeon: Alleen Borne, MD;  [facility];  Service: Open Heart Surgery;  Laterality: N/A;  . LEFT HEART CATH AND CORONARY ANGIOGRAPHY N/A 04/19/2017   Procedure: LEFT HEART CATH AND CORONARY ANGIOGRAPHY;  Surgeon: Kathleene Hazel, MD;  Location: [facility];  Service: Cardiovascular;  Laterality: N/A;  . TEE WITHOUT CARDIOVERSION N/A 04/24/2017   Procedure: TRANSESOPHAGEAL ECHOCARDIOGRAM (TEE);  Surgeon: Alleen Borne, MD;  Location: [facility];  Service: Open Heart Surgery;   Laterality: N/A;     No Known Allergies    Family History  Problem Relation Age of Onset  . Heart disease Father        Before age 79  . Hyperlipidemia Father   . Hypertension Father   . Heart disease Brother   . Heart attack Brother   . Heart disease Brother        Before age 12-  Quad-BPG  . Hyperlipidemia Brother   . Hypertension Brother   . Heart attack Brother   . Stroke Other        Grandparent  . Heart attack Other        Grandparent  . Heart block Other        Brother  . Heart disease Paternal Grandmother   . Heart disease Paternal Grandfather   . Colon cancer Neg Hx      Social History Mr. Leavy reports that he has never smoked. He has never used smokeless tobacco. Mr. Hosang reports no history of alcohol use.   Review of Systems CONSTITUTIONAL: No weight loss, fever, chills, weakness or fatigue.  HEENT: Eyes: No visual loss, blurred vision, double vision or yellow sclerae.No hearing loss, sneezing, congestion, runny nose or sore throat.  SKIN: No rash or itching.  CARDIOVASCULAR: per hpi RESPIRATORY: No shortness of breath, cough or sputum.  GASTROINTESTINAL: No anorexia, nausea, vomiting or diarrhea. No abdominal pain or blood.  GENITOURINARY: No burning on urination, no polyuria NEUROLOGICAL: No headache, dizziness, syncope, paralysis, ataxia, numbness or tingling in the extremities. No change in bowel or bladder control.  MUSCULOSKELETAL: No muscle, back pain, joint pain or stiffness.  LYMPHATICS: No enlarged nodes. No history of splenectomy.  PSYCHIATRIC: No history of depression or anxiety.  ENDOCRINOLOGIC: No reports of sweating, cold or heat intolerance. No polyuria or polydipsia.  Marland Kitchen   Physical Examination Today's Vitals   11/04/19 0929  BP: 138/70  Pulse: 85  Temp: 98.3 F (36.8 C)  SpO2: 100%  Weight: 193 lb (87.5 kg)  Height: 5\' 8"  (1.727 m)   Body mass index is 29.35 kg/m.  Gen: resting comfortably, no acute distress HEENT: no  scleral icterus, pupils equal round and reactive, no palptable cervical adenopathy,  CV: RRR, no m/r/g, no jvd Resp: Clear to auscultation bilaterally GI: abdomen is soft, non-tender, non-distended, normal bowel sounds, no hepatosplenomegaly MSK: extremities are warm, no edema.  Skin: warm, no rash Neuro:  no focal deficits Psych: appropriate affect   Diagnostic Studies 09/2005 Cath RESULTS: The aortic pressure was 122/62 with mean of 89 and left  ventricular pressure was 122/20.  LEFT MAIN CORONARY ARTERY: The left main coronary artery was free of  disease.  LEFT ANTERIOR DESCENDING ARTERY: The left anterior descending artery gave  rise to a large diagonal Gionna Polak and two septal perforators. There was 70%  narrowing of the proximal LAD and 90% stenosis of the ostium of the  diagonal Issabella Rix.  CIRCUMFLEX ARTERY: The circumflex artery gave rise to a  ramus Laurna Shetley and  two marginal branches and the posterolateral branches. The ramus Colyn Miron was  completed occluded near its ostium within the stent. There was __________  ostial stenosis of the first marginal Norene Oliveri.  RIGHT CORONARY ARTERY: The right coronary artery is a moderate size vessel  that gave rise to a right ventricular Anabella Capshaw, posterior descending Sanay Belmar  and two posterolateral branches. There was 30% narrowing in the mid right  coronary artery.  LEFT VENTRICULOGRAM: The left ventriculogram performed in the RAO  projection showed akinesis of the lateral wall. The estimated ejection  fraction was 40-45%. The left ventriculogram performed in the LAO  projection showed akinesis of the posterior and inferolateral wall. The tip  of the apex and septum moved well and the superior portion of the posterior  wall moved well.  Following aspiration thrombectomy and PTCA of the in-stent thrombotic lesion  in the ramus Carmelite Violet of the circumflex artery the stenosis improved from 100%  to 0% and the flow improved from  TIMI 0 to TIMI 3 flow. Myocardial blush  improved from grade 0 to grade 2.  The patient had the onset of chest pain at 12:35 and arrived at [facility]  emergency room at 12:42. The first ECG was obtained at 12:50 and this was  not diagnostic. The second ECG at 1325 was diagnostic for a posterior  infarction. The patient arrived at the cath lab at [facility] at 1510 and  the reperfusion was established with a diver catheter at 1552. This gave a  __________ time from the time of the diagnostic ECG of 2 hours and 27  minutes and a refusion time of 2 hours and 27 minutes.  CONCLUSION:  1. Acute posterior wall myocardial infarction due to stent thrombosis  within the stent in the ramus Veleria Barnhardt of circumflex artery with a total  occlusion of the ramus Artez Regis of the circumflex artery, 70% narrowing at  the ostium of the first marginal Saxton Chain of the circumflex artery, 70%  narrowing of the proximal LAD with 90% narrowing in the large diagonal  Shakeeta Godette, 30% narrowing in the mid right coronary and posterior and  inferolateral wall akinesis with an estimated ejection fraction of 40-  45%.  2. Successful aspiration thrombectomy and PTCA for in-stent thrombosis of  the ramus Heitor Steinhoff of the circumflex artery with improvement in central  narrowing from 100% to 0%, improvement of flow from TIMI 0 to TIMI 3  flow.    03/2017 cath  Mid RCA lesion, 80 %stenosed.  Dist RCA lesion, 60 %stenosed.  Ost Cx to Prox Cx lesion, 99 %stenosed.  Ramus-2 lesion, 95 %stenosed.  Ramus-1 lesion, 10 %stenosed.  Ost 1st Mrg to 1st Mrg lesion, 80 %stenosed.  Ost 2nd Mrg to 2nd Mrg lesion, 90 %stenosed.  Ost 2nd Diag to 2nd Diag lesion, 99 %stenosed.  Prox LAD to Mid LAD lesion, 90 %stenosed.  Mid LAD lesion, 80 %stenosed.  Dist LAD lesion, 70 %stenosed.  The left ventricular systolic function is normal.  LV end diastolic pressure is normal.  The left ventricular ejection fraction  is 50-55% by visual estimate.  There is no mitral valve regurgitation.  1. Severe triple vessel CAD 2. Preserved LV systolic function  Recommendations: Will consult CT surgery for CABG. He has been on Plavix. Will need Plavix washout prior to surgery. He may be able to go home tomorrow if CABG is planned for next week.    Assessment and Plan   1. CAD -. Committed to  lifelong DAPT - no symptoms, continue current meds. Medical therapy limited by prior low bp's, not on beta blocker or ACEI/ARB    2. Hyperlipidemia -Prior elevated LFTs on more potent statin - request pcp labs, continue current statin    F/u 6 months  Antoine Poche, M.D.

## 2019-11-04 NOTE — Patient Instructions (Signed)

## 2019-12-11 DIAGNOSIS — M069 Rheumatoid arthritis, unspecified: Secondary | ICD-10-CM | POA: Diagnosis not present

## 2019-12-11 DIAGNOSIS — Z79899 Other long term (current) drug therapy: Secondary | ICD-10-CM | POA: Diagnosis not present

## 2019-12-11 DIAGNOSIS — Z7901 Long term (current) use of anticoagulants: Secondary | ICD-10-CM | POA: Diagnosis not present

## 2019-12-11 DIAGNOSIS — K2100 Gastro-esophageal reflux disease with esophagitis, without bleeding: Secondary | ICD-10-CM | POA: Diagnosis not present

## 2019-12-18 DIAGNOSIS — Z7901 Long term (current) use of anticoagulants: Secondary | ICD-10-CM | POA: Diagnosis not present

## 2019-12-18 DIAGNOSIS — I10 Essential (primary) hypertension: Secondary | ICD-10-CM | POA: Diagnosis not present

## 2019-12-18 DIAGNOSIS — I2510 Atherosclerotic heart disease of native coronary artery without angina pectoris: Secondary | ICD-10-CM | POA: Diagnosis not present

## 2019-12-18 DIAGNOSIS — Z79899 Other long term (current) drug therapy: Secondary | ICD-10-CM | POA: Diagnosis not present

## 2019-12-18 DIAGNOSIS — E663 Overweight: Secondary | ICD-10-CM | POA: Diagnosis not present

## 2019-12-18 DIAGNOSIS — Z125 Encounter for screening for malignant neoplasm of prostate: Secondary | ICD-10-CM | POA: Diagnosis not present

## 2019-12-18 DIAGNOSIS — K219 Gastro-esophageal reflux disease without esophagitis: Secondary | ICD-10-CM | POA: Diagnosis not present

## 2019-12-18 DIAGNOSIS — E785 Hyperlipidemia, unspecified: Secondary | ICD-10-CM | POA: Diagnosis not present

## 2019-12-18 DIAGNOSIS — M069 Rheumatoid arthritis, unspecified: Secondary | ICD-10-CM | POA: Diagnosis not present

## 2019-12-18 DIAGNOSIS — R7309 Other abnormal glucose: Secondary | ICD-10-CM | POA: Diagnosis not present

## 2020-02-18 DIAGNOSIS — M069 Rheumatoid arthritis, unspecified: Secondary | ICD-10-CM | POA: Diagnosis not present

## 2020-02-18 DIAGNOSIS — Z79899 Other long term (current) drug therapy: Secondary | ICD-10-CM | POA: Diagnosis not present

## 2020-02-18 DIAGNOSIS — E785 Hyperlipidemia, unspecified: Secondary | ICD-10-CM | POA: Diagnosis not present

## 2020-02-18 DIAGNOSIS — M1990 Unspecified osteoarthritis, unspecified site: Secondary | ICD-10-CM | POA: Diagnosis not present

## 2020-02-18 DIAGNOSIS — Z7982 Long term (current) use of aspirin: Secondary | ICD-10-CM | POA: Diagnosis not present

## 2020-02-18 DIAGNOSIS — K219 Gastro-esophageal reflux disease without esophagitis: Secondary | ICD-10-CM | POA: Diagnosis not present

## 2020-02-18 DIAGNOSIS — I25119 Atherosclerotic heart disease of native coronary artery with unspecified angina pectoris: Secondary | ICD-10-CM | POA: Diagnosis not present

## 2020-02-18 DIAGNOSIS — Z809 Family history of malignant neoplasm, unspecified: Secondary | ICD-10-CM | POA: Diagnosis not present

## 2020-02-18 DIAGNOSIS — Z7902 Long term (current) use of antithrombotics/antiplatelets: Secondary | ICD-10-CM | POA: Diagnosis not present

## 2020-02-18 DIAGNOSIS — I252 Old myocardial infarction: Secondary | ICD-10-CM | POA: Diagnosis not present

## 2020-03-10 DIAGNOSIS — M069 Rheumatoid arthritis, unspecified: Secondary | ICD-10-CM | POA: Diagnosis not present

## 2020-03-10 DIAGNOSIS — E785 Hyperlipidemia, unspecified: Secondary | ICD-10-CM | POA: Diagnosis not present

## 2020-03-10 DIAGNOSIS — I2510 Atherosclerotic heart disease of native coronary artery without angina pectoris: Secondary | ICD-10-CM | POA: Diagnosis not present

## 2020-03-16 DIAGNOSIS — R69 Illness, unspecified: Secondary | ICD-10-CM | POA: Diagnosis not present

## 2020-03-16 DIAGNOSIS — I2510 Atherosclerotic heart disease of native coronary artery without angina pectoris: Secondary | ICD-10-CM | POA: Diagnosis not present

## 2020-03-16 DIAGNOSIS — I10 Essential (primary) hypertension: Secondary | ICD-10-CM | POA: Diagnosis not present

## 2020-03-16 DIAGNOSIS — Z7901 Long term (current) use of anticoagulants: Secondary | ICD-10-CM | POA: Diagnosis not present

## 2020-03-16 DIAGNOSIS — M069 Rheumatoid arthritis, unspecified: Secondary | ICD-10-CM | POA: Diagnosis not present

## 2020-03-16 DIAGNOSIS — E785 Hyperlipidemia, unspecified: Secondary | ICD-10-CM | POA: Diagnosis not present

## 2020-04-17 DIAGNOSIS — H5200 Hypermetropia, unspecified eye: Secondary | ICD-10-CM | POA: Diagnosis not present

## 2020-04-17 DIAGNOSIS — Z0100 Encounter for examination of eyes and vision without abnormal findings: Secondary | ICD-10-CM | POA: Diagnosis not present

## 2020-05-11 ENCOUNTER — Ambulatory Visit: Payer: Medicare HMO | Admitting: Cardiology

## 2020-06-03 ENCOUNTER — Encounter: Payer: Self-pay | Admitting: *Deleted

## 2020-06-03 ENCOUNTER — Encounter: Payer: Self-pay | Admitting: Cardiology

## 2020-06-03 ENCOUNTER — Other Ambulatory Visit: Payer: Self-pay

## 2020-06-03 ENCOUNTER — Ambulatory Visit: Payer: Medicare HMO | Admitting: Cardiology

## 2020-06-03 VITALS — BP 118/80 | HR 76 | Ht 68.0 in | Wt 193.4 lb

## 2020-06-03 DIAGNOSIS — I2510 Atherosclerotic heart disease of native coronary artery without angina pectoris: Secondary | ICD-10-CM | POA: Diagnosis not present

## 2020-06-03 DIAGNOSIS — E782 Mixed hyperlipidemia: Secondary | ICD-10-CM

## 2020-06-03 NOTE — Patient Instructions (Signed)

## 2020-06-03 NOTE — Progress Notes (Signed)
Clinical Summary Mr. Delair is a 79 y.o.male seen today for follow up of the following medical problems.   1. CAD/ICM/Chronic systolic HF - prior stenting in 2004 to ramus, 2007 MI with thrombosis of stent while off plavix,has been committed to lifelong plavix.LV gram at that time 40-45% with akinesis of lateral wall.  -Statin stopped by GI doctor due to abnormal LFTs, stopped rampiril due to low bp's.  - cath 03/2017 as reported below, severe multivessel disease. Referred for CABG. - 04/24/17 CABG LIMA-LAD, SVG-Ramus, SVG-RCA. Medical therapy limited postop due to soft bp's  - 03/2017 echo LVEF 55-60%   - no recent chest pain,no sob or doe - compliant with meds.     2. Carotid stenosis - prior right CEA - followed by vascular surgery   3. Hyperlipidemia -. Prior LFT elevation on higher doses of statin -labs followed by pcp  4. Rheumatoid arthritis - on methotrexate.   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  Covid Vaccine x3.   Past Medical History:  Diagnosis Date  . Arteriosclerotic cardiovascular disease (ASCVD)    Stent to RI in 2004. Posterior MI in 4/07 with thrombosis of RI at stent site while off Plavix --> reintervention; do not d/c plavix  . Arthritis   . Cerebrovascular disease    Left carotid bruit with 40-59% ICA stenosis; right carotid endarectomy in 2008  . Cholelithiasis 07/21/2009   Asymptomatic  . Colonic polyp 1999   Adenoma resected in 1999; history of diverticulosis  . Coronary artery disease   . Gastroesophageal reflux disease    Dilation of esophagel stricture in 2008  . Hyperlipidemia   . Hypertension   . Myocardial infarction (HCC) 09/2005     No Known Allergies   Current Outpatient Medications  Medication Sig Dispense Refill  . aspirin EC 81 MG tablet Take 81 mg by mouth every evening.     . clopidogrel (PLAVIX) 75 MG tablet Take 75 mg by mouth daily.    Boris Lown Oil 300 MG CAPS Take 1  capsule by mouth daily.     . methotrexate (RHEUMATREX) 2.5 MG tablet Take 2.5 mg by mouth once a week.     . Multiple Vitamin (MULTIVITAMIN) tablet Take 1 tablet by mouth daily.    Marland Kitchen omeprazole (PRILOSEC) 20 MG capsule Take 20 mg by mouth daily.    . simvastatin (ZOCOR) 40 MG tablet Take 1 tablet (40 mg total) by mouth daily at 6 PM. 30 tablet 1   No current facility-administered medications for this visit.     Past Surgical History:  Procedure Laterality Date  . CARDIAC CATHETERIZATION     cardiac stent placed  . CAROTID ENDARTERECTOMY  2008   Right  . CHOLECYSTECTOMY N/A 03/28/2014   Procedure: LAPAROSCOPIC CHOLECYSTECTOMY;  Surgeon: Dalia Heading, MD;  Location: [facility];  Service: General;  Laterality: N/A;  . COLONOSCOPY  2008   adenoma resected in 1999; no intervention in 2008  . COLONOSCOPY WITH ESOPHAGOGASTRODUODENOSCOPY (EGD)  Feb 2010   Dr. Jena Gauss: mild erosive reflux esophagitis, non-critical Schatzki's ring without dilation, moderate sized hiatal hernia, bulbar erosions. Negative H.pylori serology. Colonoscopy with normal rectum, pancolonic diverticula, needs surveillance every 5 years due to history of adenomas.   . CORONARY ARTERY BYPASS GRAFT N/A 04/24/2017   Procedure: CORONARY ARTERY BYPASS GRAFTING (CABG)x3 using left internal mammary artery and left greater saphenous vein, bilateral saphenous vein harvested endoscopically;  Surgeon: Alleen Borne, MD;  Location: [facility] OR;  Service: Open Heart Surgery;  Laterality: N/A;  . LEFT HEART CATH AND CORONARY ANGIOGRAPHY N/A 04/19/2017   Procedure: LEFT HEART CATH AND CORONARY ANGIOGRAPHY;  Surgeon: Kathleene Hazel, MD;  Location: [facility];  Service: Cardiovascular;  Laterality: N/A;  . TEE WITHOUT CARDIOVERSION N/A 04/24/2017   Procedure: TRANSESOPHAGEAL ECHOCARDIOGRAM (TEE);  Surgeon: Alleen Borne, MD;  Location: [facility] OR;  Service: Open Heart Surgery;  Laterality: N/A;     No Known Allergies    Family  History  Problem Relation Age of Onset  . Heart disease Father        Before age 97  . Hyperlipidemia Father   . Hypertension Father   . Heart disease Brother   . Heart attack Brother   . Heart disease Brother        Before age 50-  Quad-BPG  . Hyperlipidemia Brother   . Hypertension Brother   . Heart attack Brother   . Stroke Other        Grandparent  . Heart attack Other        Grandparent  . Heart block Other        Brother  . Heart disease Paternal Grandmother   . Heart disease Paternal Grandfather   . Colon cancer Neg Hx      Social History Mr. Falotico reports that he has never smoked. He has never used smokeless tobacco. Mr. Getter reports no history of alcohol use.   Review of Systems CONSTITUTIONAL: No weight loss, fever, chills, weakness or fatigue.  HEENT: Eyes: No visual loss, blurred vision, double vision or yellow sclerae.No hearing loss, sneezing, congestion, runny nose or sore throat.  SKIN: No rash or itching.  CARDIOVASCULAR: per hpi RESPIRATORY: No shortness of breath, cough or sputum.  GASTROINTESTINAL: No anorexia, nausea, vomiting or diarrhea. No abdominal pain or blood.  GENITOURINARY: No burning on urination, no polyuria NEUROLOGICAL: No headache, dizziness, syncope, paralysis, ataxia, numbness or tingling in the extremities. No change in bowel or bladder control.  MUSCULOSKELETAL: No muscle, back pain, joint pain or stiffness.  LYMPHATICS: No enlarged nodes. No history of splenectomy.  PSYCHIATRIC: No history of depression or anxiety.  ENDOCRINOLOGIC: No reports of sweating, cold or heat intolerance. No polyuria or polydipsia.  Marland Kitchen   Physical Examination Today's Vitals   06/03/20 1327  BP: 118/80  Pulse: 76  SpO2: 90%  Weight: 193 lb 6.4 oz (87.7 kg)  Height: 5\' 8"  (1.727 m)   Body mass index is 29.41 kg/m.  Gen: resting comfortably, no acute distress HEENT: no scleral icterus, pupils equal round and reactive, no palptable cervical  adenopathy,  CV: RRR, no m/r/g, no jvd Resp: Clear to auscultation bilaterally GI: abdomen is soft, non-tender, non-distended, normal bowel sounds, no hepatosplenomegaly MSK: extremities are warm, no edema.  Skin: warm, no rash Neuro:  no focal deficits Psych: appropriate affect   Diagnostic Studies 09/2005 Cath RESULTS: The aortic pressure was 122/62 with mean of 89 and left  ventricular pressure was 122/20.  LEFT MAIN CORONARY ARTERY: The left main coronary artery was free of  disease.  LEFT ANTERIOR DESCENDING ARTERY: The left anterior descending artery gave  rise to a large diagonal Roshanda Balazs and two septal perforators. There was 70%  narrowing of the proximal LAD and 90% stenosis of the ostium of the  diagonal Elizar Alpern.  CIRCUMFLEX ARTERY: The circumflex artery gave rise to a ramus Jung Yurchak and  two marginal branches and the posterolateral branches. The ramus  Tonja Jezewski was  completed occluded near its ostium within the stent. There was __________  ostial stenosis of the first marginal Staysha Truby.  RIGHT CORONARY ARTERY: The right coronary artery is a moderate size vessel  that gave rise to a right ventricular Lanijah Warzecha, posterior descending Khristin Keleher  and two posterolateral branches. There was 30% narrowing in the mid right  coronary artery.  LEFT VENTRICULOGRAM: The left ventriculogram performed in the RAO  projection showed akinesis of the lateral wall. The estimated ejection  fraction was 40-45%. The left ventriculogram performed in the LAO  projection showed akinesis of the posterior and inferolateral wall. The tip  of the apex and septum moved well and the superior portion of the posterior  wall moved well.  Following aspiration thrombectomy and PTCA of the in-stent thrombotic lesion  in the ramus Nailea Whitehorn of the circumflex artery the stenosis improved from 100%  to 0% and the flow improved from TIMI 0 to TIMI 3 flow. Myocardial blush  improved from grade 0 to grade  2.  The patient had the onset of chest pain at 12:35 and arrived at [facility]  emergency room at 12:42. The first ECG was obtained at 12:50 and this was  not diagnostic. The second ECG at 1325 was diagnostic for a posterior  infarction. The patient arrived at the cath lab at [facility] at 1510 and  the reperfusion was established with a diver catheter at 1552. This gave a  __________ time from the time of the diagnostic ECG of 2 hours and 27  minutes and a refusion time of 2 hours and 27 minutes.  CONCLUSION:  1. Acute posterior wall myocardial infarction due to stent thrombosis  within the stent in the ramus Maurisha Mongeau of circumflex artery with a total  occlusion of the ramus Buck Mcaffee of the circumflex artery, 70% narrowing at  the ostium of the first marginal Frederic Tones of the circumflex artery, 70%  narrowing of the proximal LAD with 90% narrowing in the large diagonal  Janecia Palau, 30% narrowing in the mid right coronary and posterior and  inferolateral wall akinesis with an estimated ejection fraction of 40-  45%.  2. Successful aspiration thrombectomy and PTCA for in-stent thrombosis of  the ramus Sande Pickert of the circumflex artery with improvement in central  narrowing from 100% to 0%, improvement of flow from TIMI 0 to TIMI 3  flow.    03/2017 cath  Mid RCA lesion, 80 %stenosed.  Dist RCA lesion, 60 %stenosed.  Ost Cx to Prox Cx lesion, 99 %stenosed.  Ramus-2 lesion, 95 %stenosed.  Ramus-1 lesion, 10 %stenosed.  Ost 1st Mrg to 1st Mrg lesion, 80 %stenosed.  Ost 2nd Mrg to 2nd Mrg lesion, 90 %stenosed.  Ost 2nd Diag to 2nd Diag lesion, 99 %stenosed.  Prox LAD to Mid LAD lesion, 90 %stenosed.  Mid LAD lesion, 80 %stenosed.  Dist LAD lesion, 70 %stenosed.  The left ventricular systolic function is normal.  LV end diastolic pressure is normal.  The left ventricular ejection fraction is 50-55% by visual estimate.  There is no mitral valve  regurgitation.  1. Severe triple vessel CAD 2. Preserved LV systolic function  Recommendations: Will consult CT surgery for CABG. He has been on Plavix. Will need Plavix washout prior to surgery. He may be able to go home tomorrow if CABG is planned for next week.    Assessment and Plan   1. CAD -. Committed to lifelong DAPT -  Medical therapy limited by prior low bp's, not on  beta blocker or ACEI/ARB - no symptoms, continue current meds   2. Hyperlipidemia -Prior elevated LFTs on more potent statin - continue current statin, request pcp labs      Antoine Poche, M.D.

## 2020-06-09 DIAGNOSIS — I2510 Atherosclerotic heart disease of native coronary artery without angina pectoris: Secondary | ICD-10-CM | POA: Diagnosis not present

## 2020-06-09 DIAGNOSIS — E785 Hyperlipidemia, unspecified: Secondary | ICD-10-CM | POA: Diagnosis not present

## 2020-06-09 DIAGNOSIS — Z7901 Long term (current) use of anticoagulants: Secondary | ICD-10-CM | POA: Diagnosis not present

## 2020-06-09 DIAGNOSIS — M069 Rheumatoid arthritis, unspecified: Secondary | ICD-10-CM | POA: Diagnosis not present

## 2020-07-01 DIAGNOSIS — M069 Rheumatoid arthritis, unspecified: Secondary | ICD-10-CM | POA: Diagnosis not present

## 2020-07-01 DIAGNOSIS — E785 Hyperlipidemia, unspecified: Secondary | ICD-10-CM | POA: Diagnosis not present

## 2020-07-01 DIAGNOSIS — I2510 Atherosclerotic heart disease of native coronary artery without angina pectoris: Secondary | ICD-10-CM | POA: Diagnosis not present

## 2020-07-01 DIAGNOSIS — Z7901 Long term (current) use of anticoagulants: Secondary | ICD-10-CM | POA: Diagnosis not present

## 2020-07-01 DIAGNOSIS — Z79899 Other long term (current) drug therapy: Secondary | ICD-10-CM | POA: Diagnosis not present

## 2020-09-09 DIAGNOSIS — Z7901 Long term (current) use of anticoagulants: Secondary | ICD-10-CM | POA: Diagnosis not present

## 2020-09-09 DIAGNOSIS — Z79899 Other long term (current) drug therapy: Secondary | ICD-10-CM | POA: Diagnosis not present

## 2020-09-09 DIAGNOSIS — M069 Rheumatoid arthritis, unspecified: Secondary | ICD-10-CM | POA: Diagnosis not present

## 2020-09-09 DIAGNOSIS — E785 Hyperlipidemia, unspecified: Secondary | ICD-10-CM | POA: Diagnosis not present

## 2020-09-25 ENCOUNTER — Ambulatory Visit: Payer: Medicare HMO | Admitting: Cardiology

## 2020-10-05 DIAGNOSIS — Z7902 Long term (current) use of antithrombotics/antiplatelets: Secondary | ICD-10-CM | POA: Diagnosis not present

## 2020-10-05 DIAGNOSIS — D84821 Immunodeficiency due to drugs: Secondary | ICD-10-CM | POA: Diagnosis not present

## 2020-10-05 DIAGNOSIS — E663 Overweight: Secondary | ICD-10-CM | POA: Diagnosis not present

## 2020-10-05 DIAGNOSIS — I252 Old myocardial infarction: Secondary | ICD-10-CM | POA: Diagnosis not present

## 2020-10-05 DIAGNOSIS — E785 Hyperlipidemia, unspecified: Secondary | ICD-10-CM | POA: Diagnosis not present

## 2020-10-05 DIAGNOSIS — Z008 Encounter for other general examination: Secondary | ICD-10-CM | POA: Diagnosis not present

## 2020-10-05 DIAGNOSIS — M069 Rheumatoid arthritis, unspecified: Secondary | ICD-10-CM | POA: Diagnosis not present

## 2020-10-05 DIAGNOSIS — I2510 Atherosclerotic heart disease of native coronary artery without angina pectoris: Secondary | ICD-10-CM | POA: Diagnosis not present

## 2020-10-05 DIAGNOSIS — Z7982 Long term (current) use of aspirin: Secondary | ICD-10-CM | POA: Diagnosis not present

## 2020-10-05 DIAGNOSIS — Z6829 Body mass index (BMI) 29.0-29.9, adult: Secondary | ICD-10-CM | POA: Diagnosis not present

## 2020-10-05 DIAGNOSIS — Z7722 Contact with and (suspected) exposure to environmental tobacco smoke (acute) (chronic): Secondary | ICD-10-CM | POA: Diagnosis not present

## 2020-12-11 DIAGNOSIS — Z7901 Long term (current) use of anticoagulants: Secondary | ICD-10-CM | POA: Diagnosis not present

## 2020-12-11 DIAGNOSIS — I2510 Atherosclerotic heart disease of native coronary artery without angina pectoris: Secondary | ICD-10-CM | POA: Diagnosis not present

## 2020-12-11 DIAGNOSIS — E785 Hyperlipidemia, unspecified: Secondary | ICD-10-CM | POA: Diagnosis not present

## 2020-12-11 DIAGNOSIS — M069 Rheumatoid arthritis, unspecified: Secondary | ICD-10-CM | POA: Diagnosis not present

## 2020-12-18 DIAGNOSIS — Z125 Encounter for screening for malignant neoplasm of prostate: Secondary | ICD-10-CM | POA: Diagnosis not present

## 2020-12-18 DIAGNOSIS — R7309 Other abnormal glucose: Secondary | ICD-10-CM | POA: Diagnosis not present

## 2020-12-18 DIAGNOSIS — I10 Essential (primary) hypertension: Secondary | ICD-10-CM | POA: Diagnosis not present

## 2020-12-18 DIAGNOSIS — M069 Rheumatoid arthritis, unspecified: Secondary | ICD-10-CM | POA: Diagnosis not present

## 2020-12-18 DIAGNOSIS — E663 Overweight: Secondary | ICD-10-CM | POA: Diagnosis not present

## 2020-12-18 DIAGNOSIS — I2510 Atherosclerotic heart disease of native coronary artery without angina pectoris: Secondary | ICD-10-CM | POA: Diagnosis not present

## 2020-12-18 DIAGNOSIS — Z79899 Other long term (current) drug therapy: Secondary | ICD-10-CM | POA: Diagnosis not present

## 2020-12-18 DIAGNOSIS — Z7901 Long term (current) use of anticoagulants: Secondary | ICD-10-CM | POA: Diagnosis not present

## 2020-12-18 DIAGNOSIS — K219 Gastro-esophageal reflux disease without esophagitis: Secondary | ICD-10-CM | POA: Diagnosis not present

## 2020-12-18 DIAGNOSIS — E785 Hyperlipidemia, unspecified: Secondary | ICD-10-CM | POA: Diagnosis not present

## 2021-03-15 DIAGNOSIS — I2510 Atherosclerotic heart disease of native coronary artery without angina pectoris: Secondary | ICD-10-CM | POA: Diagnosis not present

## 2021-03-15 DIAGNOSIS — M069 Rheumatoid arthritis, unspecified: Secondary | ICD-10-CM | POA: Diagnosis not present

## 2021-03-15 DIAGNOSIS — E785 Hyperlipidemia, unspecified: Secondary | ICD-10-CM | POA: Diagnosis not present

## 2021-03-15 DIAGNOSIS — Z79899 Other long term (current) drug therapy: Secondary | ICD-10-CM | POA: Diagnosis not present

## 2021-03-23 DIAGNOSIS — Z79899 Other long term (current) drug therapy: Secondary | ICD-10-CM | POA: Diagnosis not present

## 2021-03-23 DIAGNOSIS — Z7901 Long term (current) use of anticoagulants: Secondary | ICD-10-CM | POA: Diagnosis not present

## 2021-03-23 DIAGNOSIS — E663 Overweight: Secondary | ICD-10-CM | POA: Diagnosis not present

## 2021-03-23 DIAGNOSIS — M069 Rheumatoid arthritis, unspecified: Secondary | ICD-10-CM | POA: Diagnosis not present

## 2021-03-23 DIAGNOSIS — I10 Essential (primary) hypertension: Secondary | ICD-10-CM | POA: Diagnosis not present

## 2021-03-23 DIAGNOSIS — K2100 Gastro-esophageal reflux disease with esophagitis, without bleeding: Secondary | ICD-10-CM | POA: Diagnosis not present

## 2021-04-19 DIAGNOSIS — H04123 Dry eye syndrome of bilateral lacrimal glands: Secondary | ICD-10-CM | POA: Diagnosis not present

## 2021-05-26 NOTE — Progress Notes (Signed)
Cardiology Office Note    Date:  05/27/2021   ID:  Kevin Reeves, MRN [id number]  PCP:  Lemmie Evens, MD  Cardiologist: Carlyle Dolly, MD    Chief Complaint  Patient presents with   Follow-up    Overdue Visit    History of Present Illness:    Kevin Reeves is a 80 y.o. male with past medical history of CAD (s/p prior stenting to RI in 2004 and ISR in 2007, cath in 03/2017 showing multivessel CAD and underwent CABG with LIMA-LAD, SVG-RI and SVG-RCA), carotid artery stenosis (s/p R CEA), HLD and RA who presents to the office today for overdue follow-up.   He was last examined by Dr. Harl Bowie in 05/2020 and denied any recent chest pain or dyspnea on exertion. He had been committed to lifelong DAPT and was continued on ASA and Plavix along with Simvastatin 40mg  daily (had elevated LFT's with higher intensity statin therapy by review of notes).   In talking with the patient and his wife today, he reports overall feeling well since his last office visit. He enjoys remaining active around his home and also working in his shop. He denies any recent chest pain or progressive dyspnea on exertion. Does report having some shortness of breath if he "overdoes it" but no progression of symptoms. He denies any specific orthopnea, PND or lower extremity edema.  Reports good compliance with ASA and Plavix and denies any evidence of active bleeding.  Past Medical History:  Diagnosis Date   Arteriosclerotic cardiovascular disease (ASCVD)    a. Stent to RI in 2004 b. Posterior MI in 4/07 with thrombosis of RI at stent site while off Plavix --> reintervention; do not d/c plavix c. cath in 03/2017 showing multivessel CAD and underwent CABG with LIMA-LAD, SVG-RI and SVG-RCA   Arthritis    Cerebrovascular disease    Left carotid bruit with 40-59% ICA stenosis; right carotid endarectomy in 2008   Cholelithiasis 07/21/2009   Asymptomatic   Colonic polyp 06/27/1997   Adenoma resected  in 1999; history of diverticulosis   Coronary artery disease    Gastroesophageal reflux disease    Dilation of esophagel stricture in 2008   Hyperlipidemia    Hypertension    Myocardial infarction ([place]) 09/25/2005    Past Surgical History:  Procedure Laterality Date   CARDIAC CATHETERIZATION     cardiac stent placed   CAROTID ENDARTERECTOMY  2008   Right   CHOLECYSTECTOMY N/A 03/28/2014   Procedure: LAPAROSCOPIC CHOLECYSTECTOMY;  Surgeon: Jamesetta So, MD;  Location: [facility];  Service: General;  Laterality: N/A;   COLONOSCOPY  2008   adenoma resected in 1999; no intervention in 2008   COLONOSCOPY WITH ESOPHAGOGASTRODUODENOSCOPY (EGD)  Feb 2010   Dr. Gala Romney: mild erosive reflux esophagitis, non-critical Schatzki's ring without dilation, moderate sized hiatal hernia, bulbar erosions. Negative H.pylori serology. Colonoscopy with normal rectum, pancolonic diverticula, needs surveillance every 5 years due to history of adenomas.    CORONARY ARTERY BYPASS GRAFT N/A 04/24/2017   Procedure: CORONARY ARTERY BYPASS GRAFTING (CABG)x3 using left internal mammary artery and left greater saphenous vein, bilateral saphenous vein harvested endoscopically;  Surgeon: Gaye Pollack, MD;  Location: [facility];  Service: Open Heart Surgery;  Laterality: N/A;   LEFT HEART CATH AND CORONARY ANGIOGRAPHY N/A 04/19/2017   Procedure: LEFT HEART CATH AND CORONARY ANGIOGRAPHY;  Surgeon: Burnell Blanks, MD;  Location: [facility];  Service: Cardiovascular;  Laterality: N/A;   TEE WITHOUT  CARDIOVERSION N/A 04/24/2017   Procedure: TRANSESOPHAGEAL ECHOCARDIOGRAM (TEE);  Surgeon: Gaye Pollack, MD;  Location: [facility];  Service: Open Heart Surgery;  Laterality: N/A;    Current Medications: Outpatient Medications Prior to Visit  Medication Sig Dispense Refill   aspirin EC 81 MG tablet Take 81 mg by mouth every evening.      clopidogrel (PLAVIX) 75 MG tablet Take 75 mg by mouth daily.     methotrexate  (RHEUMATREX) 2.5 MG tablet Take 7.5 mg by mouth once a week.      Multiple Vitamin (MULTIVITAMIN) tablet Take 1 tablet by mouth daily.     simvastatin (ZOCOR) 40 MG tablet Take 1 tablet (40 mg total) by mouth daily at 6 PM. 30 tablet 1   Krill Oil 300 MG CAPS Take 1 capsule by mouth daily.  (Patient not taking: Reported on 05/27/2021)     No facility-administered medications prior to visit.     Allergies:   Patient has no known allergies.   Social History   Socioeconomic History   Marital status: Married    Spouse name: Not on file   Number of children: Not on file   Years of education: Not on file   Highest education level: Not on file  Occupational History   Occupation: [employer]: Part time for 50 years-working 3 days per week, now retired June 2015.   Tobacco Use   Smoking status: Never   Smokeless tobacco: Never  Vaping Use   Vaping Use: Never used  Substance and Sexual Activity   Alcohol use: No    Alcohol/week: 0.0 standard drinks   Drug use: No   Sexual activity: Yes    Partners: Female    Birth control/protection: None  Other Topics Concern   Not on file  Social History Narrative   Married and resides in [place]    Social Determinants of Health   Financial Resource Strain: Not on file  Food Insecurity: Not on file  Transportation Needs: Not on file  Physical Activity: Not on file  Stress: Not on file  Social Connections: Not on file     Family History:  The patient's family history includes Heart attack in his brother, brother and another family member; Heart block in an other family member; Heart disease in his brother, brother, father, paternal grandfather, and paternal grandmother; Hyperlipidemia in his brother and father; Hypertension in his brother and father; Stroke in an other family member.   Review of Systems:    Please see the history of present illness.     All other systems reviewed and are otherwise negative except as  noted above.   Physical Exam:    VS:  BP 118/66   Pulse 80   Ht 5\' 8"  (1.727 m)   Wt 198 lb (89.8 kg)   SpO2 100%   BMI 30.11 kg/m    General: Pleasant elderly male appearing in no acute distress. Head: Normocephalic, atraumatic. Neck: Bilateral carotid bruits. JVD not elevated.  Lungs: Respirations regular and unlabored, without wheezes or rales.  Heart: Regular rate and rhythm. No S3 or S4.  No murmur, no rubs, or gallops appreciated. Abdomen: Appears non-distended. No obvious abdominal masses. Msk:  Strength and tone appear normal for age. No obvious joint deformities or effusions. Extremities: No clubbing or cyanosis. No pitting edema.  Distal pedal pulses are 2+ bilaterally. Neuro: Alert and oriented X 3. Moves all extremities spontaneously. No focal deficits noted. Psych:  Responds to questions appropriately with a normal affect. Skin: No rashes or lesions noted  Wt Readings from Last 3 Encounters:  05/27/21 198 lb (89.8 kg)  06/03/20 193 lb 6.4 oz (87.7 kg)  11/04/19 193 lb (87.5 kg)     Studies/Labs Reviewed:   EKG:  EKG is ordered today. The ekg ordered today demonstrates NSR, HR 80 with inferior infarct pattern.   Recent Labs: No results found for requested labs within last 8760 hours.   Lipid Panel    Component Value Date/Time   CHOL 194 01/27/2015 0926   TRIG 131 01/27/2015 0926   HDL 35 (L) 01/27/2015 0926   CHOLHDL 5.5 (H) 01/27/2015 0926   VLDL 26 01/27/2015 0926   LDLCALC 133 (H) 01/27/2015 0926    Additional studies/ records that were reviewed today include:   Cardiac Catheterization: 03/2017 Mid RCA lesion, 80 %stenosed. Dist RCA lesion, 60 %stenosed. Ost Cx to Prox Cx lesion, 99 %stenosed. Ramus-2 lesion, 95 %stenosed. Ramus-1 lesion, 10 %stenosed. Ost 1st Mrg to 1st Mrg lesion, 80 %stenosed. Ost 2nd Mrg to 2nd Mrg lesion, 90 %stenosed. Ost 2nd Diag to 2nd Diag lesion, 99 %stenosed. Prox LAD to Mid LAD lesion, 90 %stenosed. Mid LAD  lesion, 80 %stenosed. Dist LAD lesion, 70 %stenosed. The left ventricular systolic function is normal. LV end diastolic pressure is normal. The left ventricular ejection fraction is 50-55% by visual estimate. There is no mitral valve regurgitation.   1. Severe triple vessel CAD 2. Preserved LV systolic function   Recommendations: Will consult CT surgery for CABG. He has been on Plavix. Will need Plavix washout prior to surgery. He may be able to go home tomorrow if CABG is planned for next week.   Echocardiogram: 03/2017 Study Conclusions   - Left ventricle: The cavity size was normal. Systolic function was    normal. The estimated ejection fraction was in the range of 55%    to 60%. There is akinesis of the basalinferior myocardium.    Doppler parameters are consistent with abnormal left ventricular    relaxation (grade 1 diastolic dysfunction).  - Aortic valve: Trileaflet; moderately thickened, moderately    calcified leaflets. Valve mobility was restricted.  - Mitral valve: There was mild regurgitation.   Impressions:   - Basal inferior wall akinesis appears new since prior study.   Assessment:    1. Coronary artery disease involving native coronary artery of native heart without angina pectoris   2. Mixed hyperlipidemia   3. Bilateral carotid artery stenosis      Plan:   In order of problems listed above:  1. CAD - He is s/p prior stenting to RI in 2004 and ISR in 2007 with cath in 03/2017 showing multivessel CAD and underwent CABG with LIMA-LAD, SVG-RI and SVG-RCA. He remains active at baseline and denies any recent anginal symptoms. - He has remained on lifelong DAPT as outlined above with ASA and Plavix. Will request most recent labs from his PCP to make sure his hemoglobin and platelets remain stable. Continue Simvastatin 40 mg daily.  2. HLD - Followed by PCP. Will request most labs. Continue Simvastatin 40mg  daily. Previously had elevated LFT's with higher  intensity statin therapy by review of notes.   3. Carotid Artery Stenosis - He is s/p R CEA and most recent dopplers in 07/2019 showed 40-59% RICA stenosis and 60-79% LICA stenosis. Previously followed by Vascular Surgery but he did not have a recall this year. Will refer back and he requests to  follow-up in [place] if able. Continue ASA, Plavix and statin therapy.    Medication Adjustments/Labs and Tests Ordered: Current medicines are reviewed at length with the patient today.  Concerns regarding medicines are outlined above.  Medication changes, Labs and Tests ordered today are listed in the Patient Instructions below. Patient Instructions  Medication Instructions:  Your physician recommends that you continue on your current medications as directed. Please refer to the Current Medication list given to you today.   Labwork: None today   Testing/Procedures: None today  Follow-Up: 6 mons Dr.Branch  Any Other Special Instructions Will Be Listed Below (If Applicable).   We have referred you to [facility] in [place]. They will call you to make appointment.  If you need a refill on your cardiac medications before your next appointment, please call your pharmacy.   Signed, Erma Heritage, PA-C  05/27/2021 5:14 PM    [facility] [address] [place], [place] [address] Phone: [phone] Fax: [phone]

## 2021-05-27 ENCOUNTER — Ambulatory Visit (INDEPENDENT_AMBULATORY_CARE_PROVIDER_SITE_OTHER): Payer: Medicare HMO | Admitting: Student

## 2021-05-27 ENCOUNTER — Other Ambulatory Visit: Payer: Self-pay

## 2021-05-27 ENCOUNTER — Encounter: Payer: Self-pay | Admitting: Student

## 2021-05-27 VITALS — BP 118/66 | HR 80 | Ht 68.0 in | Wt 198.0 lb

## 2021-05-27 DIAGNOSIS — E782 Mixed hyperlipidemia: Secondary | ICD-10-CM | POA: Diagnosis not present

## 2021-05-27 DIAGNOSIS — I2510 Atherosclerotic heart disease of native coronary artery without angina pectoris: Secondary | ICD-10-CM | POA: Diagnosis not present

## 2021-05-27 DIAGNOSIS — I6523 Occlusion and stenosis of bilateral carotid arteries: Secondary | ICD-10-CM

## 2021-05-27 NOTE — Patient Instructions (Signed)
Medication Instructions:  Your physician recommends that you continue on your current medications as directed. Please refer to the Current Medication list given to you today.   Labwork: None today   Testing/Procedures: None today  Follow-Up: 6 mons Dr.Branch  Any Other Special Instructions Will Be Listed Below (If Applicable).   We have referred you to [facility] in [place]. They will call you to make appointment.  If you need a refill on your cardiac medications before your next appointment, please call your pharmacy.

## 2021-06-14 DIAGNOSIS — I2510 Atherosclerotic heart disease of native coronary artery without angina pectoris: Secondary | ICD-10-CM | POA: Diagnosis not present

## 2021-06-14 DIAGNOSIS — M069 Rheumatoid arthritis, unspecified: Secondary | ICD-10-CM | POA: Diagnosis not present

## 2021-06-14 DIAGNOSIS — Z23 Encounter for immunization: Secondary | ICD-10-CM | POA: Diagnosis not present

## 2021-06-14 DIAGNOSIS — Z79899 Other long term (current) drug therapy: Secondary | ICD-10-CM | POA: Diagnosis not present

## 2021-07-13 ENCOUNTER — Other Ambulatory Visit (HOSPITAL_COMMUNITY): Payer: Self-pay | Admitting: Vascular Surgery

## 2021-07-13 DIAGNOSIS — I739 Peripheral vascular disease, unspecified: Secondary | ICD-10-CM

## 2021-07-13 DIAGNOSIS — I779 Disorder of arteries and arterioles, unspecified: Secondary | ICD-10-CM

## 2021-07-14 ENCOUNTER — Ambulatory Visit (INDEPENDENT_AMBULATORY_CARE_PROVIDER_SITE_OTHER): Payer: Medicare HMO

## 2021-07-14 ENCOUNTER — Other Ambulatory Visit: Payer: Self-pay

## 2021-07-14 ENCOUNTER — Ambulatory Visit (INDEPENDENT_AMBULATORY_CARE_PROVIDER_SITE_OTHER): Payer: Medicare HMO | Admitting: Vascular Surgery

## 2021-07-14 ENCOUNTER — Encounter: Payer: Self-pay | Admitting: Vascular Surgery

## 2021-07-14 VITALS — BP 108/68 | HR 71 | Temp 97.9°F | Ht 68.0 in | Wt 195.0 lb

## 2021-07-14 DIAGNOSIS — I739 Peripheral vascular disease, unspecified: Secondary | ICD-10-CM

## 2021-07-14 DIAGNOSIS — I6523 Occlusion and stenosis of bilateral carotid arteries: Secondary | ICD-10-CM | POA: Diagnosis not present

## 2021-07-14 DIAGNOSIS — I779 Disorder of arteries and arterioles, unspecified: Secondary | ICD-10-CM | POA: Diagnosis not present

## 2021-07-14 NOTE — Progress Notes (Signed)
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 male here today for follow-up.  He is here today with his wife.  He has no symptoms of TIA, stroke, amaurosis fugax or aphasia.  He does remain active and has no symptoms of intermittent claudication.  He is status post right carotid endarterectomy with Dr. Hart Rochester in 2007.  He is status post coronary bypass grafting with bilateral saphenous vein harvest and 2018.  Past Medical History:  Diagnosis Date   Arteriosclerotic cardiovascular disease (ASCVD)    a. Stent to RI in 2004 b. Posterior MI in 4/07 with thrombosis of RI at stent site while off Plavix --> reintervention; do not d/c plavix c. cath in 03/2017 showing multivessel CAD and underwent CABG with LIMA-LAD, SVG-RI and SVG-RCA   Arthritis    Cerebrovascular disease    Left carotid bruit with 40-59% ICA stenosis; right carotid endarectomy in 2008   Cholelithiasis 07/21/2009   Asymptomatic   Colonic polyp 06/27/1997   Adenoma resected in 1999; history of diverticulosis   Coronary artery disease    Gastroesophageal reflux disease    Dilation of esophagel stricture in 2008   Hyperlipidemia    Hypertension    Myocardial infarction ([facility]) 09/25/2005    Family History  Problem Relation Age of Onset   Heart disease Father        Before age 22   Hyperlipidemia Father    Hypertension Father    Heart disease Brother    Heart attack Brother    Heart disease Brother        Before age 64-  Quad-BPG   Hyperlipidemia Brother    Hypertension Brother    Heart attack Brother    Stroke Other        Grandparent   Heart attack Other        Grandparent   Heart block Other        Brother   Heart disease Paternal Grandmother    Heart disease Paternal Grandfather    Colon  cancer Neg Hx     SOCIAL HISTORY: Social History   Tobacco Use   Smoking status: Never   Smokeless tobacco: Never  Substance Use Topics   Alcohol use: No    Alcohol/week: 0.0 standard drinks    No Known Allergies  Current Outpatient Medications  Medication Sig Dispense Refill   aspirin EC 81 MG tablet Take 81 mg by mouth every evening.      clopidogrel (PLAVIX) 75 MG tablet Take 75 mg by mouth daily.     Krill Oil 300 MG CAPS Take 1 capsule by mouth daily.  (Patient not taking: Reported on 05/27/2021)     methotrexate (RHEUMATREX) 2.5 MG tablet Take 7.5 mg by mouth once a week.      Multiple Vitamin (MULTIVITAMIN) tablet Take 1 tablet by mouth daily.     simvastatin (ZOCOR) 40 MG tablet Take 1 tablet (40 mg total) by mouth daily at 6 PM. 30 tablet 1   No current facility-administered medications for this visit.    REVIEW OF SYSTEMS:   denotes positive finding,  denotes negative finding Cardiac  Comments:  Chest pain or chest pressure:    Shortness of breath upon exertion:    Short of breath when lying flat:    Irregular heart rhythm:  Vascular    Pain in calf, thigh, or hip brought on by ambulation:    Pain in feet at night that wakes you up from your sleep:     Blood clot in your veins:    Leg swelling:           PHYSICAL EXAM: Vitals:   07/14/21 0901 07/14/21 0904  BP: 110/68 108/68  Pulse: 71 71  Temp: 97.9 F (36.6 C)   TempSrc: Temporal   SpO2: 98% 99%  Weight: 195 lb (88.5 kg)   Height: 5\' 8"  (1.727 m)     GENERAL: The patient is a well-nourished male, in no acute distress. The vital signs are documented above. CARDIOVASCULAR: Carotid arteries without bruits bilaterally.  Well-healed right neck incision.  2+ radial pulses bilaterally.  2+ femoral pulses bilaterally.  1-2+ popliteal pulses bilaterally.  Absent pedal pulses bilaterally PULMONARY: There is good air exchange  MUSCULOSKELETAL: There are no major deformities or  cyanosis. NEUROLOGIC: No focal weakness or paresthesias are detected. SKIN: There are no ulcers or rashes noted. PSYCHIATRIC: The patient has a normal affect.  DATA:  Noninvasive studies were reviewed with the patient and his wife.  This shows arm index 1.86 on the right and 0.69 on the left  Carotid duplex reveals no change in the predicted 40 to 59% stenoses bilaterally.  MEDICAL ISSUES: No symptoms of lower extremity arterial insufficiency.  Discussed need for careful foot care.  He will notify his healthcare provider should he develop any ulceration or nonhealing wounds.  I would not recommend repeat ABIs unless he has new symptoms  Regarding his carotid disease, he has remained stable and I would recommend yearly carotid duplex to rule out progression.  I did discuss symptoms of carotid disease and he knows to report immediately should this occur    Larina Earthly, MD FACS [facility] Office Tel [phone]  Note: Portions of this report may have been transcribed using voice recognition software.  Every effort has been made to ensure accuracy; however, inadvertent computerized transcription errors may still be present.

## 2021-09-14 DIAGNOSIS — Z79899 Other long term (current) drug therapy: Secondary | ICD-10-CM | POA: Diagnosis not present

## 2021-09-14 DIAGNOSIS — M069 Rheumatoid arthritis, unspecified: Secondary | ICD-10-CM | POA: Diagnosis not present

## 2021-09-14 DIAGNOSIS — I6523 Occlusion and stenosis of bilateral carotid arteries: Secondary | ICD-10-CM | POA: Diagnosis not present

## 2021-09-14 DIAGNOSIS — I2510 Atherosclerotic heart disease of native coronary artery without angina pectoris: Secondary | ICD-10-CM | POA: Diagnosis not present

## 2021-09-23 DIAGNOSIS — Z79899 Other long term (current) drug therapy: Secondary | ICD-10-CM | POA: Diagnosis not present

## 2021-09-23 DIAGNOSIS — K2100 Gastro-esophageal reflux disease with esophagitis, without bleeding: Secondary | ICD-10-CM | POA: Diagnosis not present

## 2021-09-23 DIAGNOSIS — R7309 Other abnormal glucose: Secondary | ICD-10-CM | POA: Diagnosis not present

## 2021-09-23 DIAGNOSIS — I10 Essential (primary) hypertension: Secondary | ICD-10-CM | POA: Diagnosis not present

## 2021-09-23 DIAGNOSIS — M069 Rheumatoid arthritis, unspecified: Secondary | ICD-10-CM | POA: Diagnosis not present

## 2021-09-23 DIAGNOSIS — Z7901 Long term (current) use of anticoagulants: Secondary | ICD-10-CM | POA: Diagnosis not present

## 2021-09-23 DIAGNOSIS — E785 Hyperlipidemia, unspecified: Secondary | ICD-10-CM | POA: Diagnosis not present

## 2021-09-23 DIAGNOSIS — K219 Gastro-esophageal reflux disease without esophagitis: Secondary | ICD-10-CM | POA: Diagnosis not present

## 2021-09-23 DIAGNOSIS — Z125 Encounter for screening for malignant neoplasm of prostate: Secondary | ICD-10-CM | POA: Diagnosis not present

## 2021-09-23 DIAGNOSIS — E663 Overweight: Secondary | ICD-10-CM | POA: Diagnosis not present

## 2021-09-23 DIAGNOSIS — I2510 Atherosclerotic heart disease of native coronary artery without angina pectoris: Secondary | ICD-10-CM | POA: Diagnosis not present

## 2021-12-01 ENCOUNTER — Ambulatory Visit: Payer: Medicare HMO | Admitting: Cardiology

## 2021-12-01 ENCOUNTER — Telehealth: Payer: Self-pay

## 2021-12-01 ENCOUNTER — Encounter: Payer: Self-pay | Admitting: Cardiology

## 2021-12-01 VITALS — BP 132/80 | HR 74 | Ht 68.0 in | Wt 190.6 lb

## 2021-12-01 DIAGNOSIS — E782 Mixed hyperlipidemia: Secondary | ICD-10-CM | POA: Diagnosis not present

## 2021-12-01 DIAGNOSIS — I2510 Atherosclerotic heart disease of native coronary artery without angina pectoris: Secondary | ICD-10-CM

## 2021-12-01 DIAGNOSIS — Z79899 Other long term (current) drug therapy: Secondary | ICD-10-CM

## 2021-12-01 MED ORDER — AZITHROMYCIN 250 MG PO TABS: ORAL_TABLET | ORAL | 0 refills | Status: DC

## 2021-12-01 NOTE — Patient Instructions (Signed)
Medication Instructions:  Take Zithromax 250 mg Take 2 Tablets on day 1 and then take 1 Tablet on days 2-5.   *If you need a refill on your cardiac medications before your next appointment, please call your pharmacy*   Lab Work: NONE   If you have labs (blood work) drawn today and your tests are completely normal, you will receive your results only by: [place] (if you have MyChart) OR A paper copy in the mail If you have any lab test that is abnormal or we need to change your treatment, we will call you to review the results.   Testing/Procedures: NONE    Follow-Up: At [facility], you and your health needs are our priority.  As part of our continuing mission to provide you with exceptional heart care, we have created designated Provider Care Teams.  These Care Teams include your primary Cardiologist (physician) and Advanced Practice Providers (APPs -  Physician Assistants and Nurse Practitioners) who all work together to provide you with the care you need, when you need it.  We recommend signing up for the patient portal called "MyChart".  Sign up information is provided on this After Visit Summary.  MyChart is used to connect with patients for Virtual Visits (Telemedicine).  Patients are able to view lab/test results, encounter notes, upcoming appointments, etc.  Non-urgent messages can be sent to your provider as well.   To learn more about what you can do with MyChart, go to [email or web address].    Your next appointment:   6 month(s)  The format for your next appointment:   In Person  Provider:   Carlyle Dolly, MD    Other Instructions Thank you for choosing [facility]!     Important Information About Sugar

## 2021-12-01 NOTE — Progress Notes (Signed)
Clinical Summary [name] is a 81 y.o.male seen today for follow up of the following medical problems.    1. 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 LV gram at that time 40-45% with akinesis of lateral wall.  - Statin stopped by GI doctor due to abnormal LFTs, stopped rampiril due to low bp's.   - cath 03/2017 as reported below, severe multivessel disease. Referred for CABG. - 04/24/17 CABG LIMA-LAD, SVG-Ramus, SVG-RCA. Medical therapy limited postop due to soft bp's  - 03/2017 echo LVEF 55-60%     No chest pains. No cough/DOE -- compliant with meds     2. Carotid stenosis - prior right CEA - followed by vascular surgery - seen by Dr Donnetta Hutching Jan 2023     3. Hyperlipidemia  -. Prior LFT elevation on higher doses of statin -labs followed by pcp 11/2020 LDL 83   4. Rheumatoid arthritis - on methotrexate.     5. Cough - x 6 weeks, productive yellowish.  - initially had some fever. 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    Past Medical History:  Diagnosis Date   Arteriosclerotic cardiovascular disease (ASCVD)    a. Stent to RI in 2004 b. Posterior MI in 4/07 with thrombosis of RI at stent site while off Plavix --> reintervention; do not d/c plavix c. cath in 03/2017 showing multivessel CAD and underwent CABG with LIMA-LAD, SVG-RI and SVG-RCA   Arthritis    Cerebrovascular disease    Left carotid bruit with 40-59% ICA stenosis; right carotid endarectomy in 2008   Cholelithiasis 07/21/2009   Asymptomatic   Colonic polyp 06/27/1997   Adenoma resected in 1999; history of diverticulosis   Coronary artery disease    Gastroesophageal reflux disease    Dilation of esophagel stricture in 2008   Hyperlipidemia    Hypertension    Myocardial infarction ([place]) 09/25/2005     No Known  Allergies   Current Outpatient Medications  Medication Sig Dispense Refill   aspirin EC 81 MG tablet Take 81 mg by mouth every evening.      clopidogrel (PLAVIX) 75 MG tablet Take 75 mg by mouth daily.     Krill Oil 300 MG CAPS Take 1 capsule by mouth daily.  (Patient not taking: Reported on 05/27/2021)     methotrexate (RHEUMATREX) 2.5 MG tablet Take 7.5 mg by mouth once a week.      Multiple Vitamin (MULTIVITAMIN) tablet Take 1 tablet by mouth daily.     simvastatin (ZOCOR) 40 MG tablet Take 1 tablet (40 mg total) by mouth daily at 6 PM. 30 tablet 1   No current facility-administered medications for this visit.     Past Surgical History:  Procedure Laterality Date   CARDIAC CATHETERIZATION     cardiac stent placed   CAROTID ENDARTERECTOMY  2008   Right   CHOLECYSTECTOMY N/A 03/28/2014   Procedure: LAPAROSCOPIC CHOLECYSTECTOMY;  Surgeon: Jamesetta So, MD;  Location: [facility];  Service: General;  Laterality: N/A;   COLONOSCOPY  2008   adenoma resected in 1999; no intervention in 2008   COLONOSCOPY WITH ESOPHAGOGASTRODUODENOSCOPY (EGD)  Feb 2010   Dr. Gala Romney: mild erosive reflux esophagitis, non-critical Schatzki's ring without dilation, moderate sized hiatal hernia, bulbar erosions. Negative H.pylori serology. Colonoscopy with normal rectum, pancolonic diverticula, needs  surveillance every 5 years due to history of adenomas.    CORONARY ARTERY BYPASS GRAFT N/A 04/24/2017   Procedure: CORONARY ARTERY BYPASS GRAFTING (CABG)x3 using left internal mammary artery and left greater saphenous vein, bilateral saphenous vein harvested endoscopically;  Surgeon: Gaye Pollack, MD;  Location: [facility];  Service: Open Heart Surgery;  Laterality: N/A;   LEFT HEART CATH AND CORONARY ANGIOGRAPHY N/A 04/19/2017   Procedure: LEFT HEART CATH AND CORONARY ANGIOGRAPHY;  Surgeon: Burnell Blanks, MD;  Location: [place] [facility];  Service: Cardiovascular;  Laterality: N/A;   TEE WITHOUT CARDIOVERSION  N/A 04/24/2017   Procedure: TRANSESOPHAGEAL ECHOCARDIOGRAM (TEE);  Surgeon: Gaye Pollack, MD;  Location: [place];  Service: Open Heart Surgery;  Laterality: N/A;     No Known Allergies    Family History  Problem Relation Age of Onset   Heart disease Father        Before age 72   Hyperlipidemia Father    Hypertension Father    Heart disease Brother    Heart attack Brother    Heart disease Brother        Before age 75-  102   Hyperlipidemia Brother    Hypertension Brother    Heart attack Brother    Stroke Other        Grandparent   Heart attack Other        Grandparent   Heart block Other        Brother   Heart disease Paternal Grandmother    Heart disease Paternal Grandfather    Colon cancer Neg Hx      Social History Mr. Dundore reports that he has never smoked. He has never used smokeless tobacco. Mr. Carby reports no history of alcohol use.   Review of Systems CONSTITUTIONAL: No weight loss, fever, chills, weakness or fatigue.  HEENT: Eyes: No visual loss, blurred vision, double vision or yellow sclerae.No hearing loss, sneezing, congestion, runny nose or sore throat.  SKIN: No rash or itching.  CARDIOVASCULAR: per hpi RESPIRATORY: +cough GASTROINTESTINAL: No anorexia, nausea, vomiting or diarrhea. No abdominal pain or blood.  GENITOURINARY: No burning on urination, no polyuria NEUROLOGICAL: No headache, dizziness, syncope, paralysis, ataxia, numbness or tingling in the extremities. No change in bowel or bladder control.  MUSCULOSKELETAL: No muscle, back pain, joint pain or stiffness.  LYMPHATICS: No enlarged nodes. No history of splenectomy.  PSYCHIATRIC: No history of depression or anxiety.  ENDOCRINOLOGIC: No reports of sweating, cold or heat intolerance. No polyuria or polydipsia.  Marland Kitchen   Physical Examination Today's Vitals   12/01/21 1105  BP: 132/80  Pulse: 74  SpO2: 95%  Weight: 190 lb 9.6 oz (86.5 kg)  Height: 5\' 8"  (1.727 m)   Body mass  index is 28.98 kg/m.  Gen: resting comfortably, no acute distress HEENT: no scleral icterus, pupils equal round and reactive, no palptable cervical adenopathy,  CV: RRR, no m/r/g no jvd Resp: Clear to auscultation bilaterally GI: abdomen is soft, non-tender, non-distended, normal bowel sounds, no hepatosplenomegaly MSK: extremities are warm, no edema.  Skin: warm, no rash Neuro:  no focal deficits Psych: appropriate affect   Diagnostic Studies 09/2005 Cath RESULTS: The aortic pressure was 122/62 with mean of 89 and left   ventricular pressure was 122/20.   LEFT MAIN CORONARY ARTERY: The left main coronary artery was free of   disease.   LEFT ANTERIOR DESCENDING ARTERY: The left anterior descending artery gave   rise to a large diagonal Sol Englert and  two septal perforators. There was 70%   narrowing of the proximal LAD and 90% stenosis of the ostium of the   diagonal Damita Eppard.   CIRCUMFLEX ARTERY: The circumflex artery gave rise to a ramus Piera Downs and   two marginal branches and the posterolateral branches. The ramus Candida Vetter was   completed occluded near its ostium within the stent. There was __________   ostial stenosis of the first marginal Tanysha Quant.   RIGHT CORONARY ARTERY: The right coronary artery is a moderate size vessel   that gave rise to a right ventricular Bryana Froemming, posterior descending Anaiza Behrens   and two posterolateral branches. There was 30% narrowing in the mid right   coronary artery.   LEFT VENTRICULOGRAM: The left ventriculogram performed in the RAO   projection showed akinesis of the lateral wall. The estimated ejection   fraction was 40-45%. The left ventriculogram performed in the LAO   projection showed akinesis of the posterior and inferolateral wall. The tip   of the apex and septum moved well and the superior portion of the posterior   wall moved well.   Following aspiration thrombectomy and PTCA of the in-stent thrombotic lesion   in the ramus Ashby Leflore of the circumflex  artery the stenosis improved from 100%   to 0% and the flow improved from TIMI 0 to TIMI 3 flow. Myocardial blush   improved from grade 0 to grade 2.   The patient had the onset of chest pain at 12:35 and arrived at [facility]   emergency room at 12:42. The first ECG was obtained at 12:50 and this was   not diagnostic. The second ECG at 1325 was diagnostic for a posterior   infarction. The patient arrived at the cath lab at [facility] at 1510 and   the reperfusion was established with a diver catheter at 1552. This gave a   __________ time from the time of the diagnostic ECG of 2 hours and 27   minutes and a refusion time of 2 hours and 27 minutes.   CONCLUSION:   1. Acute posterior wall myocardial infarction due to stent thrombosis   within the stent in the ramus Jaylenne Hamelin of circumflex artery with a total   occlusion of the ramus Jayko Voorhees of the circumflex artery, 70% narrowing at   the ostium of the first marginal Wayburn Shaler of the circumflex artery, 70%   narrowing of the proximal LAD with 90% narrowing in the large diagonal   Fleet Higham, 30% narrowing in the mid right coronary and posterior and   inferolateral wall akinesis with an estimated ejection fraction of 40-   45%.   2. Successful aspiration thrombectomy and PTCA for in-stent thrombosis of   the ramus Kassidee Narciso of the circumflex artery with improvement in central   narrowing from 100% to 0%, improvement of flow from TIMI 0 to TIMI 3   flow.       03/2017 cath Mid RCA lesion, 80 %stenosed. Dist RCA lesion, 60 %stenosed. Ost Cx to Prox Cx lesion, 99 %stenosed. Ramus-2 lesion, 95 %stenosed. Ramus-1 lesion, 10 %stenosed. Ost 1st Mrg to 1st Mrg lesion, 80 %stenosed. Ost 2nd Mrg to 2nd Mrg lesion, 90 %stenosed. Ost 2nd Diag to 2nd Diag lesion, 99 %stenosed. Prox LAD to Mid LAD lesion, 90 %stenosed. Mid LAD lesion, 80 %stenosed. Dist LAD lesion, 70 %stenosed. The left ventricular systolic function is normal. LV end diastolic pressure is  normal. The left ventricular ejection fraction is 50-55% by visual estimate. There is no mitral  valve regurgitation.   1. Severe triple vessel CAD 2. Preserved LV systolic function   Post cath Recommendations: Will consult CT surgery for CABG. He has been on Plavix. Will need Plavix washout prior to surgery. He may be able to go home tomorrow if CABG is planned for next week.         Assessment and Plan   1. CAD -. Committed to lifelong DAPT -  Medical therapy limited by prior low bp's, not on beta blocker or ACEI/ARB  - continue current meds     2. Hyperlipidemia - Prior elevated LFTs on more potent statin - request pcp labs, may consider adding zetia 10mg  daily if LDL not at goal.        Arnoldo Lenis, M.D.

## 2021-12-01 NOTE — Telephone Encounter (Signed)
Pt notified and verbalized understanding. Pt agreeable with plan.

## 2021-12-01 NOTE — Telephone Encounter (Signed)
Most recent labs with pcp did not include a cholesterol test, can we order a fasting lipid panel please. May add zetia as we had discussed     Dominga Ferry MD

## 2021-12-01 NOTE — Telephone Encounter (Signed)
-----   Message from Antoine Poche, MD sent at 12/01/2021  3:32 PM EDT -----    ----- Message ----- From: Aurora Mask Sent: 12/01/2021  11:39 AM EDT To: Antoine Poche, MD

## 2021-12-01 NOTE — Progress Notes (Signed)
Most recent labs with pcp did not include a cholesterol test, can we order a fasting lipid panel please. May add zetia as we had discussed     J Samarra Ridgely MD 

## 2021-12-13 ENCOUNTER — Other Ambulatory Visit (HOSPITAL_COMMUNITY)
Admission: RE | Admit: 2021-12-13 | Discharge: 2021-12-13 | Disposition: A | Discharge status: Home / Self Care | Payer: Medicare HMO | Source: Ambulatory Visit | Attending: Cardiology | Admitting: Cardiology

## 2021-12-13 DIAGNOSIS — Z79899 Other long term (current) drug therapy: Secondary | ICD-10-CM | POA: Insufficient documentation

## 2021-12-13 LAB — LIPID PANEL
Cholesterol: 141 mg/dL (ref 0–200)
HDL: 35 mg/dL — ABNORMAL LOW (ref >40)
LDL Cholesterol: 77 mg/dL (ref 0–99)
Total CHOL/HDL Ratio: 4 RATIO
Triglycerides: 145 mg/dL (ref <150)
VLDL: 29 mg/dL (ref 0–40)

## 2021-12-17 DIAGNOSIS — Z951 Presence of aortocoronary bypass graft: Secondary | ICD-10-CM | POA: Diagnosis not present

## 2021-12-17 DIAGNOSIS — R600 Localized edema: Secondary | ICD-10-CM | POA: Diagnosis not present

## 2021-12-17 DIAGNOSIS — M069 Rheumatoid arthritis, unspecified: Secondary | ICD-10-CM | POA: Diagnosis not present

## 2021-12-17 DIAGNOSIS — R69 Illness, unspecified: Secondary | ICD-10-CM | POA: Diagnosis not present

## 2022-01-07 ENCOUNTER — Telehealth: Payer: Self-pay | Admitting: Cardiology

## 2022-01-07 MED ORDER — EZETIMIBE 10 MG PO TABS: 10.0000 mg | ORAL_TABLET | Freq: Every day | ORAL | 2 refills | Status: DC

## 2022-01-07 NOTE — Telephone Encounter (Signed)
Kevin Poche, MD  12/29/2021  4:32 PM EDT     Choletserol above goal, can he add zetia 10mg  daily to his current regimen. WOuld remain on simvastatin   MD   Patient informed and verbalized understanding of plan. Copy sent to PCP

## 2022-01-07 NOTE — Telephone Encounter (Signed)
Patient is returning call to discuss lab results. 

## 2022-04-18 DIAGNOSIS — E785 Hyperlipidemia, unspecified: Secondary | ICD-10-CM | POA: Diagnosis not present

## 2022-04-18 DIAGNOSIS — M069 Rheumatoid arthritis, unspecified: Secondary | ICD-10-CM | POA: Diagnosis not present

## 2022-04-18 DIAGNOSIS — I2510 Atherosclerotic heart disease of native coronary artery without angina pectoris: Secondary | ICD-10-CM | POA: Diagnosis not present

## 2022-04-18 DIAGNOSIS — I10 Essential (primary) hypertension: Secondary | ICD-10-CM | POA: Diagnosis not present

## 2022-04-21 DIAGNOSIS — H04123 Dry eye syndrome of bilateral lacrimal glands: Secondary | ICD-10-CM | POA: Diagnosis not present

## 2022-05-04 DIAGNOSIS — M79672 Pain in left foot: Secondary | ICD-10-CM | POA: Diagnosis not present

## 2022-05-04 DIAGNOSIS — S93331D Other subluxation of right foot, subsequent encounter: Secondary | ICD-10-CM | POA: Diagnosis not present

## 2022-05-04 DIAGNOSIS — M79675 Pain in left toe(s): Secondary | ICD-10-CM | POA: Diagnosis not present

## 2022-06-02 DIAGNOSIS — E785 Hyperlipidemia, unspecified: Secondary | ICD-10-CM | POA: Diagnosis not present

## 2022-06-02 DIAGNOSIS — K2100 Gastro-esophageal reflux disease with esophagitis, without bleeding: Secondary | ICD-10-CM | POA: Diagnosis not present

## 2022-06-02 DIAGNOSIS — I10 Essential (primary) hypertension: Secondary | ICD-10-CM | POA: Diagnosis not present

## 2022-06-02 DIAGNOSIS — Z7901 Long term (current) use of anticoagulants: Secondary | ICD-10-CM | POA: Diagnosis not present

## 2022-06-02 DIAGNOSIS — E663 Overweight: Secondary | ICD-10-CM | POA: Diagnosis not present

## 2022-06-02 DIAGNOSIS — K219 Gastro-esophageal reflux disease without esophagitis: Secondary | ICD-10-CM | POA: Diagnosis not present

## 2022-06-02 DIAGNOSIS — R7309 Other abnormal glucose: Secondary | ICD-10-CM | POA: Diagnosis not present

## 2022-06-02 DIAGNOSIS — I2510 Atherosclerotic heart disease of native coronary artery without angina pectoris: Secondary | ICD-10-CM | POA: Diagnosis not present

## 2022-06-02 DIAGNOSIS — Z125 Encounter for screening for malignant neoplasm of prostate: Secondary | ICD-10-CM | POA: Diagnosis not present

## 2022-06-02 DIAGNOSIS — M069 Rheumatoid arthritis, unspecified: Secondary | ICD-10-CM | POA: Diagnosis not present

## 2022-06-02 DIAGNOSIS — Z79899 Other long term (current) drug therapy: Secondary | ICD-10-CM | POA: Diagnosis not present

## 2022-06-06 ENCOUNTER — Encounter: Payer: Self-pay | Admitting: Cardiology

## 2022-06-06 ENCOUNTER — Ambulatory Visit: Payer: Medicare HMO | Attending: Cardiology | Admitting: Cardiology

## 2022-06-06 VITALS — BP 124/70 | HR 59 | Ht 68.0 in | Wt 192.2 lb

## 2022-06-06 DIAGNOSIS — I2510 Atherosclerotic heart disease of native coronary artery without angina pectoris: Secondary | ICD-10-CM

## 2022-06-06 DIAGNOSIS — E782 Mixed hyperlipidemia: Secondary | ICD-10-CM

## 2022-06-06 NOTE — Patient Instructions (Signed)
Medication Instructions:  Your physician recommends that you continue on your current medications as directed. Please refer to the Current Medication list given to you today.   Labwork: None  Testing/Procedures: None  Follow-Up: Follow up with Dr. Branch in 6 months.   Any Other Special Instructions Will Be Listed Below (If Applicable).     If you need a refill on your cardiac medications before your next appointment, please call your pharmacy.  

## 2022-06-06 NOTE — Progress Notes (Signed)
Clinical Summary Kevin Reeves is a 80 y.o.male seen today for follow up of the following medical problems.    1. 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 LV gram at that time 40-45% with akinesis of lateral wall.  - Statin stopped by GI doctor due to abnormal LFTs, stopped rampiril due to low bp's.   - cath 03/2017 as reported below, severe multivessel disease. Referred for CABG.  - 04/24/17 CABG LIMA-LAD, SVG-Ramus, SVG-RCA. Medical therapy limited postop due to soft bp's  - 03/2017 echo LVEF 55-60%   - no chest pains, no SOB/DOE - compliant with meds     2. Carotid stenosis - prior right CEA - followed by vascular surgery - seen by Dr Arbie Cookey Jan 2023, due for f/u 1 year.      3. Hyperlipidemia  -. Prior LFT elevation on higher doses of statin 11/2020 LDL 83  - 12/2021 phone note LDL too high, started zetia - reports recent labs with pcp   4. Rheumatoid arthritis - on methotrexate.           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  Past Medical History:  Diagnosis Date   Arteriosclerotic cardiovascular disease (ASCVD)    a. Stent to RI in 2004 b. Posterior MI in 4/07 with thrombosis of RI at stent site while off Plavix --> reintervention; do not d/c plavix c. cath in 03/2017 showing multivessel CAD and underwent CABG with LIMA-LAD, SVG-RI and SVG-RCA   Arthritis    Cerebrovascular disease    Left carotid bruit with 40-59% ICA stenosis; right carotid endarectomy in 2008   Cholelithiasis 07/21/2009   Asymptomatic   Colonic polyp 06/27/1997   Adenoma resected in 1999; history of diverticulosis   Coronary artery disease    Gastroesophageal reflux disease    Dilation of esophagel stricture in 2008   Hyperlipidemia    Hypertension    Myocardial infarction ([facility]) 09/25/2005     No Known Allergies   Current Outpatient  Medications  Medication Sig Dispense Refill   aspirin EC 81 MG tablet Take 81 mg by mouth every evening.      azithromycin (ZITHROMAX Z-PAK) 250 MG tablet Take 500 mg (2 Tablets) on day one. Take 1 Tablet daily days 2-5 . 6 each 0   clopidogrel (PLAVIX) 75 MG tablet Take 75 mg by mouth daily.     ezetimibe (ZETIA) 10 MG tablet Take 1 tablet (10 mg total) by mouth daily. 90 tablet 2   Krill Oil 300 MG CAPS Take 1 capsule by mouth daily.     methotrexate (RHEUMATREX) 2.5 MG tablet Take 7.5 mg by mouth once a week.      Multiple Vitamin (MULTIVITAMIN) tablet Take 1 tablet by mouth daily.     simvastatin (ZOCOR) 40 MG tablet Take 1 tablet (40 mg total) by mouth daily at 6 PM. 30 tablet 1   No current facility-administered medications for this visit.     Past Surgical History:  Procedure Laterality Date   CARDIAC CATHETERIZATION     cardiac stent placed   CAROTID ENDARTERECTOMY  2008   Right   CHOLECYSTECTOMY N/A 03/28/2014   Procedure: LAPAROSCOPIC CHOLECYSTECTOMY;  Surgeon: Dalia Heading, MD;  Location: [facility];  Service: General;  Laterality: N/A;   COLONOSCOPY  2008   adenoma resected in 1999; no intervention in  2008   COLONOSCOPY WITH ESOPHAGOGASTRODUODENOSCOPY (EGD)  Feb 2010   Dr. Jena Gauss: mild erosive reflux esophagitis, non-critical Schatzki's ring without dilation, moderate sized hiatal hernia, bulbar erosions. Negative H.pylori serology. Colonoscopy with normal rectum, pancolonic diverticula, needs surveillance every 5 years due to history of adenomas.    CORONARY ARTERY BYPASS GRAFT N/A 04/24/2017   Procedure: CORONARY ARTERY BYPASS GRAFTING (CABG)x3 using left internal mammary artery and left greater saphenous vein, bilateral saphenous vein harvested endoscopically;  Surgeon: Alleen Borne, MD;  Location: [facility];  Service: Open Heart Surgery;  Laterality: N/A;   LEFT HEART CATH AND CORONARY ANGIOGRAPHY N/A 04/19/2017   Procedure: LEFT HEART CATH AND CORONARY ANGIOGRAPHY;  Surgeon:  Kathleene Hazel, MD;  [facility]: [facility];  Service: Cardiovascular;  Laterality: N/A;   TEE WITHOUT CARDIOVERSION N/A 04/24/2017   Procedure: TRANSESOPHAGEAL ECHOCARDIOGRAM (TEE);  Surgeon: Alleen Borne, MD;  Location: [facility] OR;  Service: Open Heart Surgery;  Laterality: N/A;     No Known Allergies    Family History  Problem Relation Age of Onset   Heart disease Father        Before age 72   Hyperlipidemia Father    Hypertension Father    Heart disease Brother    Heart attack Brother    Heart disease Brother        Before age 62-  Quad-BPG   Hyperlipidemia Brother    Hypertension Brother    Heart attack Brother    Stroke Other        Grandparent   Heart attack Other        Grandparent   Heart block Other        Brother   Heart disease Paternal Grandmother    Heart disease Paternal Grandfather    Colon cancer Neg Hx      Social History Kevin Reeves reports that he has never smoked. He has never used smokeless tobacco. Kevin Reeves reports no history of alcohol use.   Review of Systems CONSTITUTIONAL: No weight loss, fever, chills, weakness or fatigue.  HEENT: Eyes: No visual loss, blurred vision, double vision or yellow sclerae.No hearing loss, sneezing, congestion, runny nose or sore throat.  SKIN: No rash or itching.  CARDIOVASCULAR: per hpi RESPIRATORY: No shortness of breath, cough or sputum.  GASTROINTESTINAL: No anorexia, nausea, vomiting or diarrhea. No abdominal pain or blood.  GENITOURINARY: No burning on urination, no polyuria NEUROLOGICAL: No headache, dizziness, syncope, paralysis, ataxia, numbness or tingling in the extremities. No change in bowel or bladder control.  MUSCULOSKELETAL: No muscle, back pain, joint pain or stiffness.  LYMPHATICS: No enlarged nodes. No history of splenectomy.  PSYCHIATRIC: No history of depression or anxiety.  ENDOCRINOLOGIC: No reports of sweating, cold or heat intolerance. No polyuria or polydipsia.   Marland Kitchen   Physical Examination Today's Vitals   06/06/22 1357  BP: 124/70  Pulse: (!) 59  Weight: 192 lb 3.2 oz (87.2 kg)  Height: 5\' 8"  (1.727 m)   Body mass index is 29.22 kg/m.  Gen: resting comfortably, no acute distress HEENT: no scleral icterus, pupils equal round and reactive, no palptable cervical adenopathy,  CV: RRR, no m/r/g,  no jvd Resp: Clear to auscultation bilaterally GI: abdomen is soft, non-tender, non-distended, normal bowel sounds, no hepatosplenomegaly MSK: extremities are warm, no edema.  Skin: warm, no rash Neuro:  no focal deficits Psych: appropriate affect   Diagnostic Studies  09/2005 Cath RESULTS: The aortic pressure was 122/62 with mean of 89  and left   ventricular pressure was 122/20.   LEFT MAIN CORONARY ARTERY: The left main coronary artery was free of   disease.   LEFT ANTERIOR DESCENDING ARTERY: The left anterior descending artery gave   rise to a large diagonal Kevin Reeves and two septal perforators. There was 70%   narrowing of the proximal LAD and 90% stenosis of the ostium of the   diagonal Kevin Reeves.   CIRCUMFLEX ARTERY: The circumflex artery gave rise to a ramus Kevin Reeves and   two marginal branches and the posterolateral branches. The ramus Kevin Reeves was   completed occluded near its ostium within the stent. There was __________   ostial stenosis of the first marginal Kevin Reeves.   RIGHT CORONARY ARTERY: The right coronary artery is a moderate size vessel   that gave rise to a right ventricular Kevin Reeves, posterior descending Kevin Reeves   and two posterolateral branches. There was 30% narrowing in the mid right   coronary artery.   LEFT VENTRICULOGRAM: The left ventriculogram performed in the RAO   projection showed akinesis of the lateral wall. The estimated ejection   fraction was 40-45%. The left ventriculogram performed in the LAO   projection showed akinesis of the posterior and inferolateral wall. The tip   of the apex and septum moved well and the  superior portion of the posterior   wall moved well.   Following aspiration thrombectomy and PTCA of the in-stent thrombotic lesion   in the ramus Kevin Reeves of the circumflex artery the stenosis improved from 100%   to 0% and the flow improved from TIMI 0 to TIMI 3 flow. Myocardial blush   improved from grade 0 to grade 2.   The patient had the onset of chest pain at 12:35 and arrived at [facility]   emergency room at 12:42. The first ECG was obtained at 12:50 and this was   not diagnostic. The second ECG at 1325 was diagnostic for a posterior   infarction. The patient arrived at the cath lab at [facility] at 1510 and   the reperfusion was established with a diver catheter at 1552. This gave a   __________ time from the time of the diagnostic ECG of 2 hours and 27   minutes and a refusion time of 2 hours and 27 minutes.   CONCLUSION:   1. Acute posterior wall myocardial infarction due to stent thrombosis   within the stent in the ramus Kevin Reeves of circumflex artery with a total   occlusion of the ramus Kevin Reeves of the circumflex artery, 70% narrowing at   the ostium of the first marginal Kevin Reeves of the circumflex artery, 70%   narrowing of the proximal LAD with 90% narrowing in the large diagonal   Kevin Reeves, 30% narrowing in the mid right coronary and posterior and   inferolateral wall akinesis with an estimated ejection fraction of 40-   45%.   2. Successful aspiration thrombectomy and PTCA for in-stent thrombosis of   the ramus Kevin Reeves of the circumflex artery with improvement in central   narrowing from 100% to 0%, improvement of flow from TIMI 0 to TIMI 3   flow.       03/2017 cath Mid RCA lesion, 80 %stenosed. Dist RCA lesion, 60 %stenosed. Ost Cx to Prox Cx lesion, 99 %stenosed. Ramus-2 lesion, 95 %stenosed. Ramus-1 lesion, 10 %stenosed. Ost 1st Mrg to 1st Mrg lesion, 80 %stenosed. Ost 2nd Mrg to 2nd Mrg lesion, 90 %stenosed. Ost 2nd Diag to 2nd Diag lesion, 99 %stenosed.  Prox LAD to  Mid LAD lesion, 90 %stenosed. Mid LAD lesion, 80 %stenosed. Dist LAD lesion, 70 %stenosed. The left ventricular systolic function is normal. LV end diastolic pressure is normal. The left ventricular ejection fraction is 50-55% by visual estimate. There is no mitral valve regurgitation.   1. Severe triple vessel CAD 2. Preserved LV systolic function   Post cath Recommendations: Will consult CT surgery for CABG. He has been on Plavix. Will need Plavix washout prior to surgery. He may be able to go home tomorrow if CABG is planned for next week.      Assessment and Plan   1. CAD -. Committed to lifelong DAPT -  Medical therapy limited by prior low bp's, not on beta blocker or ACEI/ARB  - no recent symptoms, continue current meds     2. Hyperlipidemia - Prior elevated LFTs on more potent statin - request pcp labs, we recently added zetia to his statin   F/u 6 months       Antoine Poche, M.D.

## 2022-07-20 ENCOUNTER — Other Ambulatory Visit: Payer: Self-pay | Admitting: Cardiology

## 2022-08-19 DIAGNOSIS — Z79899 Other long term (current) drug therapy: Secondary | ICD-10-CM | POA: Diagnosis not present

## 2022-08-19 DIAGNOSIS — Z7901 Long term (current) use of anticoagulants: Secondary | ICD-10-CM | POA: Diagnosis not present

## 2022-08-19 DIAGNOSIS — M069 Rheumatoid arthritis, unspecified: Secondary | ICD-10-CM | POA: Diagnosis not present

## 2022-08-19 DIAGNOSIS — Z951 Presence of aortocoronary bypass graft: Secondary | ICD-10-CM | POA: Diagnosis not present

## 2022-09-24 ENCOUNTER — Other Ambulatory Visit: Payer: Self-pay | Admitting: Cardiology

## 2022-12-23 DIAGNOSIS — E785 Hyperlipidemia, unspecified: Secondary | ICD-10-CM | POA: Diagnosis not present

## 2022-12-23 DIAGNOSIS — R0609 Other forms of dyspnea: Secondary | ICD-10-CM | POA: Diagnosis not present

## 2022-12-23 DIAGNOSIS — M170 Bilateral primary osteoarthritis of knee: Secondary | ICD-10-CM | POA: Diagnosis not present

## 2022-12-23 DIAGNOSIS — Z818 Family history of other mental and behavioral disorders: Secondary | ICD-10-CM | POA: Diagnosis not present

## 2022-12-27 ENCOUNTER — Other Ambulatory Visit (HOSPITAL_COMMUNITY): Payer: Self-pay | Admitting: Family Medicine

## 2022-12-27 DIAGNOSIS — R0600 Dyspnea, unspecified: Secondary | ICD-10-CM

## 2023-01-02 DIAGNOSIS — E785 Hyperlipidemia, unspecified: Secondary | ICD-10-CM | POA: Diagnosis not present

## 2023-01-02 DIAGNOSIS — D649 Anemia, unspecified: Secondary | ICD-10-CM | POA: Diagnosis not present

## 2023-01-02 DIAGNOSIS — I10 Essential (primary) hypertension: Secondary | ICD-10-CM | POA: Diagnosis not present

## 2023-01-02 DIAGNOSIS — I2510 Atherosclerotic heart disease of native coronary artery without angina pectoris: Secondary | ICD-10-CM | POA: Diagnosis not present

## 2023-01-02 DIAGNOSIS — R6889 Other general symptoms and signs: Secondary | ICD-10-CM | POA: Diagnosis not present

## 2023-01-02 DIAGNOSIS — R0609 Other forms of dyspnea: Secondary | ICD-10-CM | POA: Diagnosis not present

## 2023-01-02 DIAGNOSIS — E039 Hypothyroidism, unspecified: Secondary | ICD-10-CM | POA: Diagnosis not present

## 2023-01-13 DIAGNOSIS — R4181 Age-related cognitive decline: Secondary | ICD-10-CM | POA: Diagnosis not present

## 2023-01-13 DIAGNOSIS — Z818 Family history of other mental and behavioral disorders: Secondary | ICD-10-CM | POA: Diagnosis not present

## 2023-01-13 DIAGNOSIS — R0609 Other forms of dyspnea: Secondary | ICD-10-CM | POA: Diagnosis not present

## 2023-01-13 DIAGNOSIS — Z0000 Encounter for general adult medical examination without abnormal findings: Secondary | ICD-10-CM | POA: Diagnosis not present

## 2023-01-13 DIAGNOSIS — E039 Hypothyroidism, unspecified: Secondary | ICD-10-CM | POA: Diagnosis not present

## 2023-01-20 DIAGNOSIS — R0602 Shortness of breath: Secondary | ICD-10-CM | POA: Diagnosis not present

## 2023-01-20 DIAGNOSIS — I2510 Atherosclerotic heart disease of native coronary artery without angina pectoris: Secondary | ICD-10-CM | POA: Diagnosis not present

## 2023-01-20 DIAGNOSIS — E785 Hyperlipidemia, unspecified: Secondary | ICD-10-CM | POA: Diagnosis not present

## 2023-01-20 DIAGNOSIS — I10 Essential (primary) hypertension: Secondary | ICD-10-CM | POA: Diagnosis not present

## 2023-01-20 DIAGNOSIS — R946 Abnormal results of thyroid function studies: Secondary | ICD-10-CM | POA: Diagnosis not present

## 2023-01-20 DIAGNOSIS — Z1330 Encounter for screening examination for mental health and behavioral disorders, unspecified: Secondary | ICD-10-CM | POA: Diagnosis not present

## 2023-03-10 ENCOUNTER — Encounter: Payer: Self-pay | Admitting: Cardiology

## 2023-03-10 ENCOUNTER — Ambulatory Visit: Payer: Medicare HMO | Attending: Cardiology | Admitting: Cardiology

## 2023-03-10 VITALS — BP 110/62 | HR 70 | Wt 194.0 lb

## 2023-03-10 DIAGNOSIS — E782 Mixed hyperlipidemia: Secondary | ICD-10-CM | POA: Diagnosis not present

## 2023-03-10 DIAGNOSIS — I2510 Atherosclerotic heart disease of native coronary artery without angina pectoris: Secondary | ICD-10-CM | POA: Diagnosis not present

## 2023-03-10 DIAGNOSIS — R0602 Shortness of breath: Secondary | ICD-10-CM

## 2023-03-10 DIAGNOSIS — I6529 Occlusion and stenosis of unspecified carotid artery: Secondary | ICD-10-CM | POA: Diagnosis not present

## 2023-03-10 NOTE — Progress Notes (Signed)
Clinical Summary Kevin Reeves is a 82 y.o.male seen today for follow up of the following medical problems.    1. 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 LV gram at that time 40-45% with akinesis of lateral wall.  - Statin stopped by GI doctor due to abnormal LFTs, stopped rampiril due to low bp's.   - cath 03/2017 as reported below, severe multivessel disease. Referred for CABG.   - 04/24/17 CABG LIMA-LAD, SVG-Ramus, SVG-RCA. Medical therapy limited postop due to soft bp's  - 03/2017 echo LVEF 55-60%    - some recent SOB/DOE with activities. No chest pains - no recent edema.      2. Carotid stenosis - prior right CEA - followed by vascular surgery - seen by Dr Arbie Cookey Jan 2023, due for f/u 1 year.      3. Hyperlipidemia  -2015 issues with elevated LFTs, at the time also RUQ abdominal pain, elevated LFTs, and fatigue with concern for possible microlithiasis, retained CBD stone  11/2020 LDL 83   - 12/2021 phone note LDL too high, started zetia - reports recent labs with pcp  05/2022 TC 137 TG 126 HDL 43 LDL 73    4. Rheumatoid arthritis - on methotrexate.        Past Medical History:  Diagnosis Date   Arteriosclerotic cardiovascular disease (ASCVD)    a. Stent to RI in 2004 b. Posterior MI in 4/07 with thrombosis of RI at stent site while off Plavix --> reintervention; do not d/c plavix c. cath in 03/2017 showing multivessel CAD and underwent CABG with LIMA-LAD, SVG-RI and SVG-RCA   Arthritis    Cerebrovascular disease    Left carotid bruit with 40-59% ICA stenosis; right carotid endarectomy in 2008   Cholelithiasis 07/21/2009   Asymptomatic   Colonic polyp 06/27/1997   Adenoma resected in 1999; history of diverticulosis   Coronary artery disease    Gastroesophageal reflux disease    Dilation of esophagel stricture in 2008   Hyperlipidemia     Hypertension    Myocardial infarction (HCC) 09/25/2005     No Known Allergies   Current Outpatient Medications  Medication Sig Dispense Refill   aspirin EC 81 MG tablet Take 81 mg by mouth every evening.      azithromycin (ZITHROMAX Z-PAK) 250 MG tablet Take 500 mg (2 Tablets) on day one. Take 1 Tablet daily days 2-5 . (Patient not taking: Reported on 06/06/2022) 6 each 0   clopidogrel (PLAVIX) 75 MG tablet Take 75 mg by mouth daily.     ezetimibe (ZETIA) 10 MG tablet TAKE 1 TABLET BY MOUTH EVERY DAY 90 tablet 1   Krill Oil 300 MG CAPS Take 1 capsule by mouth daily. (Patient not taking: Reported on 06/06/2022)     methotrexate (RHEUMATREX) 2.5 MG tablet Take 7.5 mg by mouth once a week.      Multiple Vitamin (MULTIVITAMIN) tablet Take 1 tablet by mouth daily.     simvastatin (ZOCOR) 40 MG tablet TAKE 1 TABLET EACH DAY FOR HIGH CHOLESTEROL. 90 tablet 3   No current facility-administered medications for this visit.     Past Surgical History:  Procedure Laterality Date   CARDIAC CATHETERIZATION     cardiac stent placed   CAROTID ENDARTERECTOMY  2008   Right   CHOLECYSTECTOMY N/A 03/28/2014   Procedure: LAPAROSCOPIC CHOLECYSTECTOMY;  Surgeon: Dalia Heading, MD;  Location: [facility];  Service: General;  Laterality: N/A;   COLONOSCOPY  2008   adenoma resected in 1999; no intervention in 2008   COLONOSCOPY WITH ESOPHAGOGASTRODUODENOSCOPY (EGD)  Feb 2010   Dr. Jena Gauss: mild erosive reflux esophagitis, non-critical Schatzki's ring without dilation, moderate sized hiatal hernia, bulbar erosions. Negative H.pylori serology. Colonoscopy with normal rectum, pancolonic diverticula, needs surveillance every 5 years due to history of adenomas.    CORONARY ARTERY BYPASS GRAFT N/A 04/24/2017   Procedure: CORONARY ARTERY BYPASS GRAFTING (CABG)x3 using left internal mammary artery and left greater saphenous vein, bilateral saphenous vein harvested endoscopically;  Surgeon: Alleen Borne, MD;  Location:  [facility] OR;  Service: Open Heart Surgery;  Laterality: N/A;   LEFT HEART CATH AND CORONARY ANGIOGRAPHY N/A 04/19/2017   Procedure: LEFT HEART CATH AND CORONARY ANGIOGRAPHY;  Surgeon: Kathleene Hazel, MD;  Location: [facility];  Service: Cardiovascular;  Laterality: N/A;   TEE WITHOUT CARDIOVERSION N/A 04/24/2017   Procedure: TRANSESOPHAGEAL ECHOCARDIOGRAM (TEE);  Surgeon: Alleen Borne, MD;  Location: [facility] OR;  Service: Open Heart Surgery;  Laterality: N/A;     No Known Allergies    Family History  Problem Relation Age of Onset   Heart disease Father        Before age 76   Hyperlipidemia Father    Hypertension Father    Heart disease Brother    Heart attack Brother    Heart disease Brother        Before age 82-  Quad-BPG   Hyperlipidemia Brother    Hypertension Brother    Heart attack Brother    Stroke Other        Grandparent   Heart attack Other        Grandparent   Heart block Other        Brother   Heart disease Paternal Grandmother    Heart disease Paternal Grandfather    Colon cancer Neg Hx      Social History Mr. Louvier reports that he has never smoked. He has never used smokeless tobacco. Mr. Buffkin reports no history of alcohol use.   Review of Systems CONSTITUTIONAL: No weight loss, fever, chills, weakness or fatigue.  HEENT: Eyes: No visual loss, blurred vision, double vision or yellow sclerae.No hearing loss, sneezing, congestion, runny nose or sore throat.  SKIN: No rash or itching.  CARDIOVASCULAR: per hpi RESPIRATORY: No shortness of breath, cough or sputum.  GASTROINTESTINAL: No anorexia, nausea, vomiting or diarrhea. No abdominal pain or blood.  GENITOURINARY: No burning on urination, no polyuria NEUROLOGICAL: No headache, dizziness, syncope, paralysis, ataxia, numbness or tingling in the extremities. No change in bowel or bladder control.  MUSCULOSKELETAL: No muscle, back pain, joint pain or stiffness.  LYMPHATICS: No enlarged nodes. No  history of splenectomy.  PSYCHIATRIC: No history of depression or anxiety.  ENDOCRINOLOGIC: No reports of sweating, cold or heat intolerance. No polyuria or polydipsia.  Marland Kitchen   Physical Examination Today's Vitals   03/10/23 0916  BP: 110/62  Pulse: 70  SpO2: 97%  Weight: 194 lb (88 kg)   Body mass index is 29.5 kg/m.  Gen: resting comfortably, no acute distress HEENT: no scleral icterus, pupils equal round and reactive, no palptable cervical adenopathy,  CV: RRR, no mrg, no jvd Resp: Clear to auscultation bilaterally GI: abdomen is soft, non-tender, non-distended, normal bowel sounds, no hepatosplenomegaly MSK: extremities are warm, no edema.  Skin: warm, no rash Neuro:  no focal deficits Psych: appropriate affect  Diagnostic Studies   09/2005 Cath RESULTS: The aortic pressure was 122/62 with mean of 89 and left   ventricular pressure was 122/20.   LEFT MAIN CORONARY ARTERY: The left main coronary artery was free of   disease.   LEFT ANTERIOR DESCENDING ARTERY: The left anterior descending artery gave   rise to a large diagonal Zyaire Mccleod and two septal perforators. There was 70%   narrowing of the proximal LAD and 90% stenosis of the ostium of the   diagonal Cairo Agostinelli.   CIRCUMFLEX ARTERY: The circumflex artery gave rise to a ramus Delta Deshmukh and   two marginal branches and the posterolateral branches. The ramus Peggye Poon was   completed occluded near its ostium within the stent. There was __________   ostial stenosis of the first marginal Aricka Goldberger.   RIGHT CORONARY ARTERY: The right coronary artery is a moderate size vessel   that gave rise to a right ventricular Leota Maka, posterior descending Demorris Choyce   and two posterolateral branches. There was 30% narrowing in the mid right   coronary artery.   LEFT VENTRICULOGRAM: The left ventriculogram performed in the RAO   projection showed akinesis of the lateral wall. The estimated ejection   fraction was 40-45%. The left ventriculogram  performed in the LAO   projection showed akinesis of the posterior and inferolateral wall. The tip   of the apex and septum moved well and the superior portion of the posterior   wall moved well.   Following aspiration thrombectomy and PTCA of the in-stent thrombotic lesion   in the ramus Kailee Essman of the circumflex artery the stenosis improved from 100%   to 0% and the flow improved from TIMI 0 to TIMI 3 flow. Myocardial blush   improved from grade 0 to grade 2.   The patient had the onset of chest pain at 12:35 and arrived at [facility]   emergency room at 12:42. The first ECG was obtained at 12:50 and this was   not diagnostic. The second ECG at 1325 was diagnostic for a posterior   infarction. The patient arrived at the cath lab at [facility] at 1510 and   the reperfusion was established with a diver catheter at 1552. This gave a   __________ time from the time of the diagnostic ECG of 2 hours and 27   minutes and a refusion time of 2 hours and 27 minutes.   CONCLUSION:   1. Acute posterior wall myocardial infarction due to stent thrombosis   within the stent in the ramus Vanity Larsson of circumflex artery with a total   occlusion of the ramus Keisuke Hollabaugh of the circumflex artery, 70% narrowing at   the ostium of the first marginal Rohan Juenger of the circumflex artery, 70%   narrowing of the proximal LAD with 90% narrowing in the large diagonal   Daylah Sayavong, 30% narrowing in the mid right coronary and posterior and   inferolateral wall akinesis with an estimated ejection fraction of 40-   45%.   2. Successful aspiration thrombectomy and PTCA for in-stent thrombosis of   the ramus Genna Casimir of the circumflex artery with improvement in central   narrowing from 100% to 0%, improvement of flow from TIMI 0 to TIMI 3   flow.       03/2017 cath Mid RCA lesion, 80 %stenosed. Dist RCA lesion, 60 %stenosed. Ost Cx to Prox Cx lesion, 99 %stenosed. Ramus-2 lesion, 95 %stenosed. Ramus-1 lesion, 10 %stenosed. Ost  1st Mrg to 1st Mrg lesion, 80 %stenosed. Suezanne Jacquet  2nd Mrg to 2nd Mrg lesion, 90 %stenosed. Ost 2nd Diag to 2nd Diag lesion, 99 %stenosed. Prox LAD to Mid LAD lesion, 90 %stenosed. Mid LAD lesion, 80 %stenosed. Dist LAD lesion, 70 %stenosed. The left ventricular systolic function is normal. LV end diastolic pressure is normal. The left ventricular ejection fraction is 50-55% by visual estimate. There is no mitral valve regurgitation.   1. Severe triple vessel CAD 2. Preserved LV systolic function   Post cath Recommendations: Will consult CT surgery for CABG. He has been on Plavix. Will need Plavix washout prior to surgery. He may be able to go home tomorrow if CABG is planned for next week.   Assessment and Plan   1. CAD -. Committed to lifelong DAPT -  Medical therapy limited by prior low bp's, not on beta blocker or ACEI/ARB  - some recent SOB/DOE, we will plan for echo - may consider ischemic testing pending echo results and progression of symptoms.      2. Hyperlipidemia - Prior elevated LFTs in setting of gallballder issues in 2015, statin was stopped for a period. When restarted only on low dose therapy. At this time not clear statin ever played a role in prior LFT elevation - request pcp labs, if LDL above 55 would consider chagning to crestor 20mg  daily and closely monitoring LFTs.   3. Carotid stenosis - repeat carotid US   F/u 3 months  Antoine Poche, M.D.

## 2023-03-10 NOTE — Patient Instructions (Signed)
Medication Instructions:  Your physician recommends that you continue on your current medications as directed. Please refer to the Current Medication list given to you today.  *If you need a refill on your cardiac medications before your next appointment, please call your pharmacy*   Lab Work: None If you have labs (blood work) drawn today and your tests are completely normal, you will receive your results only by: MyChart Message (if you have MyChart) OR A paper copy in the mail If you have any lab test that is abnormal or we need to change your treatment, we will call you to review the results.   Testing/Procedures: Your physician has requested that you have an echocardiogram. Echocardiography is a painless test that uses sound waves to create images of your heart. It provides your doctor with information about the size and shape of your heart and how well your heart's chambers and valves are working. This procedure takes approximately one hour. There are no restrictions for this procedure. Please do NOT wear cologne, perfume, aftershave, or lotions (deodorant is allowed). Please arrive 15 minutes prior to your appointment time.  Your physician has requested that you have a carotid duplex. This test is an ultrasound of the carotid arteries in your neck. It looks at blood flow through these arteries that supply the brain with blood. Allow one hour for this exam. There are no restrictions or special instructions.    Follow-Up: At [facility], you and your health needs are our priority.  As part of our continuing mission to provide you with exceptional heart care, we have created designated Provider Care Teams.  These Care Teams include your primary Cardiologist (physician) and Advanced Practice Providers (APPs -  Physician Assistants and Nurse Practitioners) who all work together to provide you with the care you need, when you need it.  We recommend signing up for the patient  portal called "MyChart".  Sign up information is provided on this After Visit Summary.  MyChart is used to connect with patients for Virtual Visits (Telemedicine).  Patients are able to view lab/test results, encounter notes, upcoming appointments, etc.  Non-urgent messages can be sent to your provider as well.   To learn more about what you can do with MyChart, go to [email or web address].    Your next appointment:   3 month(s)  Provider:   You may see Dina Rich, MD or one of the following Advanced Practice Providers on your designated Care Team:   Randall An, PA-C  Jacolyn Reedy, [place]     Other Instructions

## 2023-03-26 ENCOUNTER — Other Ambulatory Visit: Payer: Self-pay | Admitting: Cardiology

## 2023-04-10 ENCOUNTER — Ambulatory Visit (HOSPITAL_COMMUNITY)
Admission: RE | Admit: 2023-04-10 | Discharge: 2023-04-10 | Disposition: A | Discharge status: Home / Self Care | Payer: Medicare HMO | Source: Ambulatory Visit | Attending: Cardiology | Admitting: Cardiology

## 2023-04-10 ENCOUNTER — Ambulatory Visit (HOSPITAL_COMMUNITY)
Admission: RE | Admit: 2023-04-10 | Discharge: 2023-04-10 | Disposition: A | Discharge status: Home / Self Care | Payer: Medicare HMO | Source: Ambulatory Visit | Attending: Cardiology

## 2023-04-10 DIAGNOSIS — R0602 Shortness of breath: Secondary | ICD-10-CM | POA: Insufficient documentation

## 2023-04-10 DIAGNOSIS — I6529 Occlusion and stenosis of unspecified carotid artery: Secondary | ICD-10-CM | POA: Diagnosis not present

## 2023-04-10 DIAGNOSIS — I6522 Occlusion and stenosis of left carotid artery: Secondary | ICD-10-CM | POA: Diagnosis not present

## 2023-04-10 LAB — ECHOCARDIOGRAM COMPLETE
AR max vel: 3.03 cm2
AV Area VTI: 2.71 cm2
AV Area mean vel: 2.84 cm2
AV Mean grad: 8.3 mm[Hg]
AV Peak grad: 14.9 mm[Hg]
Ao pk vel: 1.93 m/s
Area-P 1/2: 2.81 cm2
Calc EF: 56.4 %
S' Lateral: 3.3 cm
Single Plane A2C EF: 55 %
Single Plane A4C EF: 60.4 %

## 2023-04-10 NOTE — Progress Notes (Signed)
  Echocardiogram 2D Echocardiogram has been performed.  Kevin Reeves 04/10/2023, 9:27 AM

## 2023-04-24 DIAGNOSIS — H52223 Regular astigmatism, bilateral: Secondary | ICD-10-CM | POA: Diagnosis not present

## 2023-04-24 DIAGNOSIS — H524 Presbyopia: Secondary | ICD-10-CM | POA: Diagnosis not present

## 2023-04-24 DIAGNOSIS — H04123 Dry eye syndrome of bilateral lacrimal glands: Secondary | ICD-10-CM | POA: Diagnosis not present

## 2023-06-09 ENCOUNTER — Ambulatory Visit: Payer: Medicare HMO | Attending: Student | Admitting: Student

## 2023-06-09 ENCOUNTER — Encounter: Payer: Self-pay | Admitting: Student

## 2023-06-09 VITALS — BP 138/64 | HR 62 | Ht 68.0 in | Wt 188.2 lb

## 2023-06-09 DIAGNOSIS — E785 Hyperlipidemia, unspecified: Secondary | ICD-10-CM | POA: Diagnosis not present

## 2023-06-09 DIAGNOSIS — I2510 Atherosclerotic heart disease of native coronary artery without angina pectoris: Secondary | ICD-10-CM

## 2023-06-09 DIAGNOSIS — I6523 Occlusion and stenosis of bilateral carotid arteries: Secondary | ICD-10-CM

## 2023-06-09 MED ORDER — ROSUVASTATIN CALCIUM 20 MG PO TABS: 20.0000 mg | ORAL_TABLET | Freq: Every day | ORAL | 3 refills | Status: DC

## 2023-06-09 NOTE — Progress Notes (Signed)
Cardiology Office Note    Date:  06/09/2023  ID:  Kevin Reeves, DOB 12/11/1940, MRN [id number] Cardiologist: Dina Rich, MD    History of Present Illness:    Kevin Reeves is a 82 y.o. male  with past medical history of CAD (s/p prior stenting to RI in 2004 and ISR in 2007, cath in 03/2017 showing multivessel CAD and underwent CABG with LIMA-LAD, SVG-RI and SVG-RCA), carotid artery stenosis (s/p R CEA in 2008), HLD and RA who presents to the office today for 41-month follow-up.  He was examined by Dr. Wyline Mood in 02/2023 and reported some dyspnea on exertion with activity but denied any recent chest pain. It was recommended to obtain a follow-up echocardiogram and based off results, could consider ischemic evaluation. Repeat carotid dopplers were also recommended. Echo showed a preserved EF of 55-60% with no regional WMA. RV function was normal and he did not have any significant valve abnormalities. 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 Repeat carotid dopplers showed patent R CEA site and he did have 70-99% LICA stenosis and he was referred back to Vascular but declined.   In talking with the patient and his spouse today, he reports overall doing well since his last visit. He is not very active at baseline but denies any chest pain or dyspnea on exertion when doing routine activities around his home. Feels like his breathing has improved over the past few months. No recent palpitations, orthopnea, PND or pitting edema.  Studies Reviewed:   EKG: EKG is ordered today and demonstrates:   EKG Interpretation Date/Time:  Friday June 09 2023 13:09:49 EST Ventricular Rate:  62 PR Interval:  142 QRS Duration:  98 QT Interval:  430 QTC Calculation: 436 R Axis:   -32  Text Interpretation: Normal sinus rhythm with sinus arrhythmia Left axis deviation TWI along inferior leads - noted on prior tracings.   Confirmed by Randall An ([id number]) on 06/09/2023 1:12:24 PM        Echocardiogram: 03/2023 IMPRESSIONS     1. Left ventricular ejection fraction, by estimation, is 55 to 60%. The  left ventricle has normal function. The left ventricle has no regional  wall motion abnormalities. There is mild left ventricular hypertrophy.  Left ventricular diastolic parameters  are indeterminate.   2. Right ventricular systolic function is normal. The right ventricular  size is normal. Tricuspid regurgitation signal is inadequate for assessing  PA pressure.   3. The mitral valve is normal in structure. No evidence of mitral valve  regurgitation. No evidence of mitral stenosis.   4. The aortic valve is tricuspid. There is severe calcifcation of the  aortic valve. There is severe thickening of the aortic valve. Aortic valve  regurgitation is not visualized. Aortic valve sclerosis/calcification is  present, without any evidence of  aortic stenosis.   5. Aortic dilatation noted. There is mild dilatation of the ascending  aorta, measuring 37 mm.    Carotid Dopplers: 03/2023 IMPRESSION: Right:   Surgical changes of right carotid endarterectomy. Note that established duplex criteria have not been validated in the setting of prior endarterectomy, however, there is no evidence of recurrent high-grade stenosis.   Left:   Heterogeneous and partially calcified plaque at the left carotid bifurcation contributes to 70%-99% stenosis by established duplex criteria.    Physical Exam:   VS:  BP 138/64   Pulse 62   Ht 5\' 8"  (1.727 m)   Wt 188 lb 3.2 oz (85.4 kg)  SpO2 98%   BMI 28.62 kg/m    Wt Readings from Last 3 Encounters:  06/09/23 188 lb 3.2 oz (85.4 kg)  03/10/23 194 lb (88 kg)  06/06/22 192 lb 3.2 oz (87.2 kg)     GEN: Pleasant, elderly male appearing in no acute distress NECK: No JVD; No carotid bruits CARDIAC: RRR, no murmurs, rubs, gallops RESPIRATORY:  Clear to auscultation without rales, wheezing or rhonchi  ABDOMEN: Appears non-distended. No  obvious abdominal masses. EXTREMITIES: No clubbing or cyanosis. No pitting edema.  Distal pedal pulses are 2+ bilaterally.   Assessment and Plan:   1. CAD - He had prior PCI's as discussed above and underwent CABG in 2018 with LIMA-LAD, SVG-RI and SVG-RCA. He denies any recent anginal symptoms when performing routine activities. It was previously recommended to continue lifelong DAPT and he has remained on ASA 81 mg daily and Plavix 75 mg daily.  Will continue Zetia 10 mg daily but adjust Simvastatin to Crestor as discussed below.  2. Carotid Artery Stenosis  - He underwent prior R CEA in 2008 and recent carotid dopplers in 03/2023 showed patent R CEA site and he did have 70-99% LICA stenosis and he was referred back to Vascular but declined.  - We reviewed indications for Vascular referral again today and he is not interested at this time. He is interested in additional evaluation and we discussed obtaining a CTA Neck. If still with significant stenosis by reassessment, he is open to Vascular referral at that time and prefers [facility] (previously followed by Dr. Arbie Cookey). Will plan to adjust statin therapy as outlined below. Continue ASA and Plavix.    3. HLD - LDL was at 73 in 05/2022. We reviewed recommendations for goal LDL to be less than 55 given his CAD and carotid artery stenosis. He has been on Simvastatin 40 mg daily and will stop this and switch to Crestor 20 mg daily. Will plan for follow-up FLP and LFT's in 6-8 weeks. Continue Zetia 10 mg daily.   Signed, Ellsworth Lennox, PA-C

## 2023-06-09 NOTE — Patient Instructions (Addendum)
Medication Instructions:  Your physician has recommended you make the following change in your medication:  -Stop Simvastatin  -Start Crestor 20 mg tablet once daily.   *If you need a refill on your cardiac medications before your next appointment, please call your pharmacy*   Lab Work: In 3 months; -Fasting Lipid Panel -CMET If you have labs (blood work) drawn today and your tests are completely normal, you will receive your results only by: MyChart Message (if you have MyChart) OR A paper copy in the mail If you have any lab test that is abnormal or we need to change your treatment, we will call you to review the results.   Testing/Procedures: CTA- NECK   Follow-Up: At [facility], you and your health needs are our priority.  As part of our continuing mission to provide you with exceptional heart care, we have created designated Provider Care Teams.  These Care Teams include your primary Cardiologist (physician) and Advanced Practice Providers (APPs -  Physician Assistants and Nurse Practitioners) who all work together to provide you with the care you need, when you need it.  We recommend signing up for the patient portal called "MyChart".  Sign up information is provided on this After Visit Summary.  MyChart is used to connect with patients for Virtual Visits (Telemedicine).  Patients are able to view lab/test results, encounter notes, upcoming appointments, etc.  Non-urgent messages can be sent to your provider as well.   To learn more about what you can do with MyChart, go to [email or web address].    Your next appointment:   6 month(s)  Provider:   You may see Dina Rich, MD or one of the following Advanced Practice Providers on your designated Care Team:   Randall An, PA-C  Jacolyn Reedy, [place]     Other Instructions

## 2023-07-20 ENCOUNTER — Ambulatory Visit (HOSPITAL_COMMUNITY)
Admission: RE | Admit: 2023-07-20 | Discharge: 2023-07-20 | Disposition: A | Discharge status: Home / Self Care | Payer: Medicare HMO | Source: Ambulatory Visit | Attending: Student | Admitting: Student

## 2023-07-20 DIAGNOSIS — I6503 Occlusion and stenosis of bilateral vertebral arteries: Secondary | ICD-10-CM | POA: Diagnosis not present

## 2023-07-20 DIAGNOSIS — I6523 Occlusion and stenosis of bilateral carotid arteries: Secondary | ICD-10-CM | POA: Insufficient documentation

## 2023-07-20 DIAGNOSIS — I708 Atherosclerosis of other arteries: Secondary | ICD-10-CM | POA: Diagnosis not present

## 2023-07-20 DIAGNOSIS — I672 Cerebral atherosclerosis: Secondary | ICD-10-CM | POA: Diagnosis not present

## 2023-07-20 MED ORDER — IOHEXOL 350 MG/ML SOLN
75.0000 mL | Freq: Once | INTRAVENOUS | Status: AC | PRN
  Administered 2023-07-20: 75 mL via INTRAVENOUS

## 2023-08-01 DIAGNOSIS — X32XXXA Exposure to sunlight, initial encounter: Secondary | ICD-10-CM | POA: Diagnosis not present

## 2023-08-01 DIAGNOSIS — L570 Actinic keratosis: Secondary | ICD-10-CM | POA: Diagnosis not present

## 2023-08-31 DIAGNOSIS — Z130 Encounter for screening for diseases of the blood and blood-forming organs and certain disorders involving the immune mechanism: Secondary | ICD-10-CM | POA: Diagnosis not present

## 2023-08-31 DIAGNOSIS — Z1329 Encounter for screening for other suspected endocrine disorder: Secondary | ICD-10-CM | POA: Diagnosis not present

## 2023-08-31 DIAGNOSIS — Z13220 Encounter for screening for lipoid disorders: Secondary | ICD-10-CM | POA: Diagnosis not present

## 2023-08-31 DIAGNOSIS — Z0000 Encounter for general adult medical examination without abnormal findings: Secondary | ICD-10-CM | POA: Diagnosis not present

## 2023-08-31 DIAGNOSIS — Z131 Encounter for screening for diabetes mellitus: Secondary | ICD-10-CM | POA: Diagnosis not present

## 2023-10-09 DIAGNOSIS — R946 Abnormal results of thyroid function studies: Secondary | ICD-10-CM | POA: Diagnosis not present

## 2023-12-09 ENCOUNTER — Ambulatory Visit (INDEPENDENT_AMBULATORY_CARE_PROVIDER_SITE_OTHER)

## 2023-12-09 ENCOUNTER — Encounter: Payer: Self-pay | Admitting: Emergency Medicine

## 2023-12-09 ENCOUNTER — Ambulatory Visit
Admission: EM | Admit: 2023-12-09 | Discharge: 2023-12-09 | Disposition: A | Discharge status: Home / Self Care | Attending: Family Medicine | Admitting: Family Medicine

## 2023-12-09 ENCOUNTER — Other Ambulatory Visit: Payer: Self-pay

## 2023-12-09 DIAGNOSIS — R0602 Shortness of breath: Secondary | ICD-10-CM | POA: Diagnosis not present

## 2023-12-09 DIAGNOSIS — R058 Other specified cough: Secondary | ICD-10-CM | POA: Diagnosis not present

## 2023-12-09 DIAGNOSIS — I771 Stricture of artery: Secondary | ICD-10-CM | POA: Diagnosis not present

## 2023-12-09 DIAGNOSIS — R051 Acute cough: Secondary | ICD-10-CM

## 2023-12-09 DIAGNOSIS — J208 Acute bronchitis due to other specified organisms: Secondary | ICD-10-CM

## 2023-12-09 MED ORDER — ALBUTEROL SULFATE (2.5 MG/3ML) 0.083% IN NEBU
2.5000 mg | INHALATION_SOLUTION | Freq: Once | RESPIRATORY_TRACT | Status: AC
  Administered 2023-12-09: 2.5 mg via RESPIRATORY_TRACT

## 2023-12-09 MED ORDER — PREDNISONE 20 MG PO TABS: 40.0000 mg | ORAL_TABLET | Freq: Every day | ORAL | 0 refills | Status: DC

## 2023-12-09 MED ORDER — AZELASTINE HCL 0.1 % NA SOLN: 1.0000 | Freq: Two times a day (BID) | NASAL | 0 refills | Status: DC

## 2023-12-09 MED ORDER — ALBUTEROL SULFATE HFA 108 (90 BASE) MCG/ACT IN AERS: 2.0000 | INHALATION_SPRAY | RESPIRATORY_TRACT | 0 refills | Status: DC | PRN

## 2023-12-09 NOTE — ED Triage Notes (Signed)
 Pt reports cough that is now in chest and weakness since Tuesday. Denies any known fevers.

## 2023-12-09 NOTE — Discharge Instructions (Signed)
 In order to find a new [facility] care provider, you may go to the [facility] health website and click on the find a provider tab.  From here you may find a primary care provider and schedule your first visit

## 2023-12-09 NOTE — ED Provider Notes (Signed)
 [facility]    CSN: [id number] Arrival date & time: 12/09/23  1103      History   Chief Complaint Chief Complaint  Patient presents with   Cough    HPI Kevin Reeves is a 83 y.o. male.   Patient presenting today with 5-day history of progressively worsening productive cough, chest tightness, wheezing, weakness, fatigue, rhinorrhea.  Denies chest pain, abdominal pain, vomiting, diarrhea, sore throat, fevers.  So far not trying anything over-the-counter for symptoms.  No known history of chronic pulmonary disease.    Past Medical History:  Diagnosis Date   Arteriosclerotic cardiovascular disease (ASCVD)    a. Stent to RI in 2004 b. Posterior MI in 4/07 with thrombosis of RI at stent site while off Plavix  --> reintervention; do not d/c plavix  c. 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   Coronary artery disease 04/24/2017   Unstable angina (HCC) 04/19/2017   Coronary artery disease involving native coronary artery of native heart with unstable angina pectoris (HCC)    Abdominal pain 06/24/2014   Transaminitis 06/24/2014   Hyperbilirubinemia 06/24/2014   Abdominal fluid collection    Intra-abdominal abscess post-procedure (HCC) 05/13/2014   Carotid stenosis 04/09/2014   Aftercare following surgery of the circulatory system 04/09/2014   Arteriosclerotic cardiovascular disease (ASCVD)    Cerebrovascular  disease    Hypertension    Gastroesophageal reflux disease    Hyperlipidemia    Cholelithiasis 07/21/2009    Past Surgical History:  Procedure Laterality Date   CARDIAC CATHETERIZATION     cardiac stent placed   CAROTID ENDARTERECTOMY  2008   Right   CHOLECYSTECTOMY N/A 03/28/2014   Procedure: LAPAROSCOPIC CHOLECYSTECTOMY;  Surgeon: Beau Bound, MD;  Location: [facility];  Service: General;  Laterality: N/A;   COLONOSCOPY  2008   adenoma resected in 1999; no intervention in 2008   COLONOSCOPY WITH ESOPHAGOGASTRODUODENOSCOPY (EGD)  Feb 2010   Dr. Riley Cheadle: mild erosive reflux esophagitis, non-critical Schatzki's ring without dilation, moderate sized hiatal hernia, bulbar erosions. Negative H.pylori serology. Colonoscopy with normal rectum, pancolonic diverticula, needs surveillance every 5 years due to history of adenomas.    CORONARY ARTERY BYPASS GRAFT N/A 04/24/2017   Procedure: CORONARY ARTERY BYPASS GRAFTING (CABG)x3 using left internal mammary artery and left greater saphenous vein, bilateral saphenous vein harvested endoscopically;  Surgeon: Bartley Lightning, MD;  Location: [facility];  Service: Open Heart Surgery;  Laterality: N/A;   LEFT HEART CATH AND CORONARY ANGIOGRAPHY N/A 04/19/2017   Procedure: LEFT HEART CATH AND CORONARY ANGIOGRAPHY;  Surgeon: Odie Benne, MD;  Location: [facility];  Service: Cardiovascular;  Laterality: N/A;   TEE WITHOUT CARDIOVERSION N/A 04/24/2017   Procedure: TRANSESOPHAGEAL ECHOCARDIOGRAM (TEE);  Surgeon: Bartley Lightning, MD;  Location: [facility];  Service: Open Heart Surgery;  Laterality: N/A;       Home Medications    Prior  to Admission medications   Medication Sig Start Date End Date Taking? Authorizing Provider  albuterol  (VENTOLIN  HFA) 108 (90 Base) MCG/ACT inhaler Inhale 2 puffs into the lungs every 4 (four) hours as needed. 12/09/23  Yes Corbin Dess, PA-C  azelastine (ASTELIN) 0.1 % nasal spray Place 1 spray into both  nostrils 2 (two) times daily. Use in each nostril as directed 12/09/23  Yes Corbin Dess, PA-C  predniSONE (DELTASONE) 20 MG tablet Take 2 tablets (40 mg total) by mouth daily with breakfast. 12/09/23  Yes Corbin Dess, PA-C  aspirin  EC 81 MG tablet Take 81 mg by mouth every evening.     [provider]  clopidogrel  (PLAVIX ) 75 MG tablet Take 75 mg by mouth daily.    [provider]  ezetimibe  (ZETIA ) 10 MG tablet TAKE 1 TABLET BY MOUTH EVERY DAY 03/27/23   Laurann Pollock, MD  levothyroxine (SYNTHROID) 25 MCG tablet Take by mouth. 01/13/23   [provider]  methotrexate (RHEUMATREX) 2.5 MG tablet Take 7.5 mg by mouth once a week.  06/28/19   [provider]  Multiple Vitamin (MULTIVITAMIN) tablet Take 1 tablet by mouth daily. 05/29/17   Allegra Arch, PA-C  rosuvastatin  (CRESTOR ) 20 MG tablet Take 1 tablet (20 mg total) by mouth daily. 06/09/23   Dorma Gash, PA-C    Family History Family History  Problem Relation Age of Onset   Heart disease Father        Before age 60   Hyperlipidemia Father    Hypertension Father    Heart disease Brother    Heart attack Brother    Heart disease Brother        Before age 36-  Quad-BPG   Hyperlipidemia Brother    Hypertension Brother    Heart attack Brother    Stroke Other        Grandparent   Heart attack Other        Grandparent   Heart block Other        Brother   Heart disease Paternal Grandmother    Heart disease Paternal Grandfather    Colon cancer Neg Hx     Social History Social History   Tobacco Use   Smoking status: Never   Smokeless tobacco: Never  Vaping Use   Vaping status: Never Used  Substance Use Topics   Alcohol use: No    Alcohol/week: 0.0 standard drinks of alcohol   Drug use: No     Allergies   Patient has no known allergies.   Review of Systems Review of Systems Per HPI  Physical Exam Triage Vital Signs ED Triage Vitals  Encounter  Vitals Group     BP 12/09/23 1141 (!) 91/52     Girls Systolic BP Percentile --      Girls Diastolic BP Percentile --      Boys Systolic BP Percentile --      Boys Diastolic BP Percentile --      Pulse Rate 12/09/23 1141 72     Resp 12/09/23 1141 (!) 22     Temp 12/09/23 1141 98.4 F (36.9 C)     Temp Source 12/09/23 1141 Oral     SpO2 12/09/23 1141 94 %     Weight --      Height --      Head Circumference --      Peak Flow --      Pain Score 12/09/23 1143 0  Pain Loc --      Pain Education --      Exclude from Growth Chart --    No data found.  Updated Vital Signs BP (!) 91/52 (BP Location: Right Arm)   Pulse 72   Temp 98.4 F (36.9 C) (Oral)   Resp (!) 22   SpO2 94%   Visual Acuity Right Eye Distance:   Left Eye Distance:   Bilateral Distance:    Right Eye Near:   Left Eye Near:    Bilateral Near:     Physical Exam Vitals and nursing note reviewed.  Constitutional:      Appearance: He is well-developed.  HENT:     Head: Atraumatic.     Right Ear: External ear normal.     Left Ear: External ear normal.     Nose: Rhinorrhea present.     Mouth/Throat:     Mouth: Mucous membranes are moist.     Pharynx: Oropharynx is clear. No oropharyngeal exudate or posterior oropharyngeal erythema.   Eyes:     Conjunctiva/sclera: Conjunctivae normal.     Pupils: Pupils are equal, round, and reactive to light.    Cardiovascular:     Rate and Rhythm: Normal rate and regular rhythm.  Pulmonary:     Effort: Pulmonary effort is normal. No respiratory distress.     Breath sounds: Wheezing present. No rales.   Musculoskeletal:        General: Normal range of motion.     Cervical back: Normal range of motion and neck supple.  Lymphadenopathy:     Cervical: No cervical adenopathy.   Skin:    General: Skin is warm and dry.   Neurological:     Mental Status: He is alert and oriented to person, place, and time.   Psychiatric:        Behavior: Behavior normal.       UC Treatments / Results  Labs (all labs ordered are listed, but only abnormal results are displayed) Labs Reviewed - No data to display  EKG   Radiology DG Chest 2 View Result Date: 12/09/2023 CLINICAL DATA:  productive cough, SOB x 5 days EXAM: CHEST - 2 VIEW COMPARISON:  May 29, 2017 FINDINGS: The cardiomediastinal silhouette is unchanged in contour.Status post median sternotomy. Tortuous thoracic aorta. Similar chronic RIGHT-sided pleural blunting. No pneumothorax. No acute pleuroparenchymal abnormality. IMPRESSION: No acute cardiopulmonary abnormality. Electronically Signed   By: Clancy Crimes M.D.   On: 12/09/2023 13:28    Procedures Procedures (including critical care time)  Medications Ordered in UC Medications  albuterol  (PROVENTIL ) (2.5 MG/3ML) 0.083% nebulizer solution 2.5 mg (2.5 mg Nebulization Given 12/09/23 1306)    Initial Impression / Assessment and Plan / UC Course  I have reviewed the triage vital signs and the nursing notes.  Pertinent labs & imaging results that were available during my care of the patient were reviewed by me and considered in my medical decision making (see chart for details).     Does have some wheezes on exam, suspect secondary to viral bronchitis.  Significant improvement after albuterol  nebulizer treatment.  Chest x-ray today negative for pneumonia.  Will treat with prednisone, albuterol , Astelin, supportive over-the-counter medications and home care.  Strict return precautions reviewed.  Final Clinical Impressions(s) / UC Diagnoses   Final diagnoses:  Acute cough  Viral bronchitis     Discharge Instructions      In order to find a new [facility] care provider, you may go to the [facility]  health website and click on the find a provider tab.  From here you may find a primary care provider and schedule your first visit    ED Prescriptions     Medication Sig Dispense Auth. Provider   predniSONE (DELTASONE) 20 MG tablet  Take 2 tablets (40 mg total) by mouth daily with breakfast. 10 tablet Corbin Dess, PA-C   albuterol  (VENTOLIN  HFA) 108 (90 Base) MCG/ACT inhaler Inhale 2 puffs into the lungs every 4 (four) hours as needed. 18 g Corbin Dess, PA-C   azelastine (ASTELIN) 0.1 % nasal spray Place 1 spray into both nostrils 2 (two) times daily. Use in each nostril as directed 30 mL Corbin Dess, PA-C      PDMP not reviewed this encounter.   Corbin Dess, [place] 12/09/23 1347

## 2023-12-10 NOTE — Progress Notes (Signed)
 Cardiology Office Note    Date:  12/12/2023  ID:  Kevin Reeves, DOB 13-Jul-1940, MRN [id number] Cardiologist: Alvan Carrier, MD    History of Present Illness:    Kevin Reeves is a 83 y.o. male with past medical history of CAD (s/p prior stenting to RI in 2004 and ISR in 2007, cath in 03/2017 showing multivessel CAD and underwent CABG with LIMA-LAD, SVG-RI and SVG-RCA), carotid artery stenosis (s/p R CEA in 2008), HLD and RA who presents to the office today for 6-month follow-up.   He was last examined by myself in 05/2023 and denied any recent anginal symptoms at that time. Recent carotid dopplers had shown 70 to 99% LICA stenosis and he had been referred back to Vascular Surgery but declined  He was open to follow-up imaging and a CTA Neck was obtained which showed 70% narrowing along the proximal LICA and history of right CEA with 50% stenosis. Was recommended to anticipate repeat imaging in 1 year for reassessment. He had been on Simvastatin  and LDL was above goal, therefore this was discontinued and switched to Crestor  20 mg daily with him being continued on Zetia  10 mg daily.  In talking with the patient and his wife today, he reports overall doing well from a cardiac perspective since his last visit. He did recently have bronchitis but reports improvement in this with breathing treatments and steroids. He does yard work at times and routine chores around his home. Denies any exertional chest pain or progressive dyspnea on exertion with this. No specific orthopnea, PND or pitting edema. His wife reports he is starting to have worsening balance issues and typically leans forward at times. No associated dizziness or presyncope.  Studies Reviewed:   EKG: EKG is not ordered today.  Echocardiogram: 03/2023 IMPRESSIONS     1. Left ventricular ejection fraction, by estimation, is 55 to 60%. The  left ventricle has normal function. The left ventricle has no regional  wall motion  abnormalities. There is mild left ventricular hypertrophy.  Left ventricular diastolic parameters  are indeterminate.   2. Right ventricular systolic function is normal. The right ventricular  size is normal. Tricuspid regurgitation signal is inadequate for assessing  PA pressure.   3. The mitral valve is normal in structure. No evidence of mitral valve  regurgitation. No evidence of mitral stenosis.   4. The aortic valve is tricuspid. There is severe calcifcation of the  aortic valve. There is severe thickening of the aortic valve. Aortic valve  regurgitation is not visualized. Aortic valve sclerosis/calcification is  present, without any evidence of  aortic stenosis.   5. Aortic dilatation noted. There is mild dilatation of the ascending  aorta, measuring 37 mm.    CTA Neck: 06/2023 IMPRESSION: 1. 70% atheromatous narrowing at the proximal left ICA. 2. History of right carotid endarterectomy with irregular proximal ICA and 50% stenosis. 3. 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 4. Flow reducing stenosis at the left vertebral origin.   Physical Exam:   VS:  BP 112/62 (BP Location: Left Arm)   Pulse 75   Ht 5' 8 (1.727 m)   Wt 190 lb 3.2 oz (86.3 kg)   SpO2 95%   BMI 28.92 kg/m    Wt Readings from Last 3 Encounters:  12/12/23 190 lb 3.2 oz (86.3 kg)  06/09/23 188 lb 3.2 oz (85.4 kg)  03/10/23 194 lb (88 kg)  GEN: Pleasant, elderly male appearing in no acute distress NECK: No JVD; No carotid bruits CARDIAC: RRR, no murmurs, rubs, gallops RESPIRATORY:  Clear to auscultation without rales, wheezing or rhonchi  ABDOMEN: Appears non-distended. No obvious abdominal masses. EXTREMITIES: No clubbing or cyanosis. No pitting edema.  Distal pedal pulses are 2+ bilaterally.   Assessment and Plan:   1. Coronary artery disease involving native coronary artery of native heart without angina pectoris - He  previously underwent CABG in 2018 with details as outlined above. He remains active at baseline for his age and denies any recent anginal symptoms.   - Continue ASA 81 mg daily and Plavix  75 mg daily (previously recommend to be on lifelong DAPT by review of prior notes from Dr. Alvan). He is also on Crestor  20 mg daily and Zetia  10 mg daily as outlined below.  2. Bilateral carotid artery stenosis - He did undergo right CEA in 2008 and recent CTA Neck in 06/2023 showed 70% stenosis along the LICA and 50% stenosis along his RICA. Continue ASA, Plavix  and statin therapy.  3. Hyperlipidemia LDL goal <55 - LDL was at 73 when checked in 2023 and Simvastatin  was transitioned to Crestor  20 mg daily at the time of his last office visit in 05/2023 with him being continued on Zetia  10 mg daily.  - Repeat labs in 08/2023 showed his LDL had improved to 60. LFT's WNL. Continue Crestor  20 mg daily and Zetia  10 mg daily.   Signed, Kevin CHRISTELLA Qua, PA-C

## 2023-12-12 ENCOUNTER — Encounter: Payer: Self-pay | Admitting: Student

## 2023-12-12 ENCOUNTER — Ambulatory Visit: Attending: Student | Admitting: Student

## 2023-12-12 VITALS — BP 112/62 | HR 75 | Ht 68.0 in | Wt 190.2 lb

## 2023-12-12 DIAGNOSIS — I6523 Occlusion and stenosis of bilateral carotid arteries: Secondary | ICD-10-CM

## 2023-12-12 DIAGNOSIS — E785 Hyperlipidemia, unspecified: Secondary | ICD-10-CM | POA: Diagnosis not present

## 2023-12-12 DIAGNOSIS — I2510 Atherosclerotic heart disease of native coronary artery without angina pectoris: Secondary | ICD-10-CM | POA: Diagnosis not present

## 2023-12-12 MED ORDER — EZETIMIBE 10 MG PO TABS: 10.0000 mg | ORAL_TABLET | Freq: Every day | ORAL | 3 refills | Status: DC

## 2023-12-12 MED ORDER — CLOPIDOGREL BISULFATE 75 MG PO TABS: 75.0000 mg | ORAL_TABLET | Freq: Every day | ORAL | 3 refills | Status: DC

## 2023-12-12 NOTE — Patient Instructions (Signed)

## 2024-02-22 ENCOUNTER — Telehealth: Payer: Self-pay | Admitting: Cardiology

## 2024-02-22 ENCOUNTER — Other Ambulatory Visit: Payer: Self-pay | Admitting: Cardiology

## 2024-02-22 MED ORDER — CLOPIDOGREL BISULFATE 75 MG PO TABS: 75.0000 mg | ORAL_TABLET | Freq: Every day | ORAL | 1 refills | Status: DC

## 2024-02-22 NOTE — Telephone Encounter (Signed)
 refilled

## 2024-02-22 NOTE — Telephone Encounter (Signed)
 Pt came in office and stated that he is in need of a refill on his Clopidogrel  75 MG. He stated Dr. Knowlton is the one that was refilling but he has retired and will not see his new PCP until October. He is wondering if Dr. Alvan can refill this medication for him. [phone] is the best number to reach the patient.

## 2024-03-28 ENCOUNTER — Encounter: Payer: Self-pay | Admitting: Family Medicine

## 2024-03-28 ENCOUNTER — Ambulatory Visit: Admitting: Family Medicine

## 2024-03-28 VITALS — BP 119/72 | HR 56 | Temp 97.9°F | Ht 68.0 in | Wt 189.6 lb

## 2024-03-28 DIAGNOSIS — Z23 Encounter for immunization: Secondary | ICD-10-CM | POA: Diagnosis not present

## 2024-03-28 DIAGNOSIS — K219 Gastro-esophageal reflux disease without esophagitis: Secondary | ICD-10-CM

## 2024-03-28 DIAGNOSIS — Z131 Encounter for screening for diabetes mellitus: Secondary | ICD-10-CM

## 2024-03-28 DIAGNOSIS — E039 Hypothyroidism, unspecified: Secondary | ICD-10-CM | POA: Diagnosis not present

## 2024-03-28 DIAGNOSIS — I6523 Occlusion and stenosis of bilateral carotid arteries: Secondary | ICD-10-CM | POA: Diagnosis not present

## 2024-03-28 DIAGNOSIS — I2510 Atherosclerotic heart disease of native coronary artery without angina pectoris: Secondary | ICD-10-CM | POA: Diagnosis not present

## 2024-03-28 DIAGNOSIS — E782 Mixed hyperlipidemia: Secondary | ICD-10-CM

## 2024-03-28 DIAGNOSIS — R7989 Other specified abnormal findings of blood chemistry: Secondary | ICD-10-CM | POA: Diagnosis not present

## 2024-03-28 DIAGNOSIS — E559 Vitamin D deficiency, unspecified: Secondary | ICD-10-CM | POA: Diagnosis not present

## 2024-03-28 DIAGNOSIS — M069 Rheumatoid arthritis, unspecified: Secondary | ICD-10-CM | POA: Diagnosis not present

## 2024-03-28 DIAGNOSIS — I10 Essential (primary) hypertension: Secondary | ICD-10-CM

## 2024-03-28 DIAGNOSIS — D539 Nutritional anemia, unspecified: Secondary | ICD-10-CM | POA: Diagnosis not present

## 2024-03-28 NOTE — Assessment & Plan Note (Signed)
Vitamin d level today 

## 2024-03-28 NOTE — Assessment & Plan Note (Signed)
 TSH today. Continue synthroid 50mcg daily. The correct intake of thyroid  hormone (Levothyroxine, Synthroid), is on empty stomach first thing in the morning, with water , separated by at least 30 minutes from breakfast and other medications,  and separated by more than 4 hours from calcium , iron , multivitamins, acid reflux medications (PPIs).   - This medication is a life-long medication and will be needed to correct thyroid  hormone imbalances for the rest of your life.  The dose may change from time to time, based on thyroid  blood work.   - It is extremely important to be consistent taking this medication, near the same time each morning.   -AVOID TAKING PRODUCTS CONTAINING BIOTIN (commonly found in Hair, Skin, Nails vitamins) AS IT INTERFERES WITH THE VALIDITY OF THYROID  FUNCTION BLOOD TESTS.

## 2024-03-28 NOTE — Assessment & Plan Note (Signed)
 Continue methotrexate 7.5mg  weekly

## 2024-03-28 NOTE — Assessment & Plan Note (Signed)
 Dilation of esophagel stricture in 2008. Continue omeprazole 20mg  daily.  Elevated HOB if needed and avoid lying down 2-3 hours after eating, avoid coffee, alcohol, chocolate, fatty foods, citrus, carbonated beverages, spicy foods, late meals, and smoking. Return to office if symptoms return or worsen and seek medical care for difficulty swallowing, bleeding, anemia, weight loss, or recurrent vomiting.

## 2024-03-28 NOTE — Assessment & Plan Note (Signed)
 Controlled HTN, HLD, BG  Stent to RI in 2004. Posterior MI in 4/07 with thrombosis of RI at stent site while off Plavix  --> reintervention Lifelong Rx with CLOPIDIGREL or similar agent

## 2024-03-28 NOTE — Assessment & Plan Note (Signed)
 followed by cardiology, s/p prior stenting to RI in 2004 and ISR in 2007, cath in 03/2017 showing multivessel CAD and underwent CABG with LIMA-LAD, SVG-RI and SVG-RCA, on ASA and Plavix  lifetime per review of cardiology note

## 2024-03-28 NOTE — Assessment & Plan Note (Signed)
 Labs today.  Your labs showed elevated cholesterol. I recommend consuming a heart healthy diet such as Mediterranean diet or DASH diet with whole grains, fruits, vegetable, fish, lean meats, nuts, and olive oil. Limit sweets and processed foods. I also encourage moderate intensity exercise 150 minutes weekly as tolerated. This is 3-5 times weekly for 30-50 minutes each session. Goal should be pace of 3 miles/hours, or walking 1.5 miles in 30 minutes.

## 2024-03-28 NOTE — Assessment & Plan Note (Signed)
 Followed by cardiology, continue simvastatin . s/p R CEA in 2008, 70% narrowing along the proximal LICA and history of right CEA with 50% stenosis, refused vascular surgery, needs repeat imaging in 2026

## 2024-03-28 NOTE — Progress Notes (Signed)
 New Patient Office Visit  Subjective    Patient ID: Kevin Reeves, male    DOB: 1941/06/21  Age: 83 y.o. MRN: [id number]  CC:  Chief Complaint  Patient presents with   Establish Care    Problems with balance as he ages .    HPI Kevin Reeves presents to establish care. Oriented to practice routines and expectations. Has been seeing PCP regularly, Dr Reeves. PMH includes CAD (s/p prior stenting to RI in 2004 and ISR in 2007, cath in 03/2017 showing multivessel CAD and underwent CABG with LIMA-LAD, SVG-RI and SVG-RCA), carotid artery stenosis (s/p R CEA in 2008), HLD and RA HTN, GERD, vitamin D deficiency, hypothyroidism. Is also followed by Cardiology.  HLD: on Simvastatin  20mg  daily No chest pain  CAD: followed by cardiology, s/p prior stenting to RI in 2004 and ISR in 2007, cath in 03/2017 showing multivessel CAD and underwent CABG with LIMA-LAD, SVG-RI and SVG-RCA, on ASA and Plavix  lifetime per review of cardiology note  GERD: on Omeprazole 20mg  daily Denies hematemesis, melena, hematochezia, occult blood in stool, iron  deficiency anemia, anorexia, unexplained weight loss, dysphagia, odynophagia, persistent vomiting  Hypothyroidism: on Synthroid 50mcg daily, increased in March 2025 Denies fatigue, cold or heat intolerance, weight gain or loss, constipation, diarrhea, palpitations, lower extremity edema, anxiety/depression  Carotid Stenosis: s/p R CEA in 2008, 70% narrowing along the proximal LICA and history of right CEA with 50% stenosis, refused vascular surgery, needs repeat imaging in 2026 On simvastatin , not taking Zetia   RA: on Methotrexate 7.5mg  weekly  HPI Cardiology 12/12/2023 for reference only: Kevin Reeves is a 83 y.o. male with past medical history of CAD (s/p prior stenting to RI in 2004 and ISR in 2007, cath in 03/2017 showing multivessel CAD and underwent CABG with LIMA-LAD, SVG-RI and SVG-RCA), carotid artery stenosis (s/p R CEA in 2008), HLD and RA who  presents to the office today for 31-month follow-up.    He was last examined by myself in 05/2023 and denied any recent anginal symptoms at that time. Recent carotid dopplers had shown 70 to 99% LICA stenosis and he had been referred back to Vascular Surgery but declined  He was open to follow-up imaging and a CTA Neck was obtained which showed 70% narrowing along the proximal LICA and history of right CEA with 50% stenosis. Was recommended to anticipate repeat imaging in 1 year for reassessment. He had been on Simvastatin  and LDL was above goal, therefore this was discontinued and switched to Crestor  20 mg daily with him being continued on Zetia  10 mg daily.   In talking with the patient and his wife today, he reports overall doing well from a cardiac perspective since his last visit. He did recently have bronchitis but reports improvement in this with breathing treatments and steroids. He does yard work at times and routine chores around his home. Denies any exertional chest pain or progressive dyspnea on exertion with this. No specific orthopnea, PND or pitting edema. His wife reports he is starting to have worsening balance issues and typically leans forward at times. No associated dizziness or presyncope.  1. Coronary artery disease involving native coronary artery of native heart without angina pectoris - He previously underwent CABG in 2018 with details as outlined above. He remains active at baseline for his age and denies any recent anginal symptoms.   - Continue ASA 81 mg daily and Plavix  75 mg daily (previously recommend to be on lifelong DAPT by review of  prior notes from Dr. Alvan). He is also on Crestor  20 mg daily and Zetia  10 mg daily as outlined below.   2. Bilateral carotid artery stenosis - He did undergo right CEA in 2008 and recent CTA Neck in 06/2023 showed 70% stenosis along the LICA and 50% stenosis along his RICA. Continue ASA, Plavix  and statin therapy.   3. Hyperlipidemia LDL  goal <55 - LDL was at 73 when checked in 2023 and Simvastatin  was transitioned to Crestor  20 mg daily at the time of his last office visit in 05/2023 with him being continued on Zetia  10 mg daily.  - Repeat labs in 08/2023 showed his LDL had improved to 60. LFT's WNL. Continue Crestor  20 mg daily and Zetia  10 mg daily.  Health Maintenance  Topic Date Due   COVID-19 Vaccine (1) 04/13/2024 (Originally 08/11/1945)   Zoster Vaccines- Shingrix (1 of 2) 06/28/2024 (Originally 08/12/1959)   DTaP/Tdap/Td (1 - Tdap) 03/28/2025 (Originally 08/12/1959)   Pneumococcal Vaccine: 50+ Years (2 of 2 - PCV) 03/28/2025 (Originally 06/14/2022)   Medicare Annual Wellness (AWV)  08/30/2024   Influenza Vaccine  Completed   HPV VACCINES  Aged Out   Meningococcal B Vaccine  Aged Out   Hepatitis C Screening  Discontinued     Outpatient Encounter Medications as of 03/28/2024  Medication Sig   aspirin  EC 81 MG tablet Take 81 mg by mouth every evening.    B Complex-C (B-COMPLEX WITH VITAMIN C) tablet Take 1 tablet by mouth daily.   Cholecalciferol (VITAMIN D3) 250 MCG (10000 UT) capsule Take 10,000 Units by mouth daily.   clopidogrel  (PLAVIX ) 75 MG tablet Take 1 tablet (75 mg total) by mouth daily.   levothyroxine (SYNTHROID) 25 MCG tablet Take by mouth. (Patient taking differently: Take 50 mcg by mouth daily before breakfast.)   methotrexate (RHEUMATREX) 2.5 MG tablet Take 7.5 mg by mouth once a week.    Multiple Vitamin (MULTIVITAMIN) tablet Take 1 tablet by mouth daily.   omeprazole (PRILOSEC) 20 MG capsule Take 20 mg by mouth every other day.   rosuvastatin  (CRESTOR ) 20 MG tablet Take 1 tablet (20 mg total) by mouth daily.   [DISCONTINUED] albuterol  (VENTOLIN  HFA) 108 (90 Base) MCG/ACT inhaler Inhale 2 puffs into the lungs every 4 (four) hours as needed.   [DISCONTINUED] azelastine  (ASTELIN ) 0.1 % nasal spray Place 1 spray into both nostrils 2 (two) times daily. Use in each nostril as directed   [DISCONTINUED]  ezetimibe  (ZETIA ) 10 MG tablet Take 1 tablet (10 mg total) by mouth daily.   [DISCONTINUED] predniSONE  (DELTASONE ) 20 MG tablet Take 2 tablets (40 mg total) by mouth daily with breakfast.   No facility-administered encounter medications on file as of 03/28/2024.    Past Medical History:  Diagnosis Date   Arteriosclerotic cardiovascular disease (ASCVD)    a. Stent to RI in 2004 b. Posterior MI in 4/07 with thrombosis of RI at stent site while off Plavix  --> reintervention; do not d/c plavix  c. cath in 03/2017 showing multivessel CAD and underwent CABG with LIMA-LAD, SVG-RI and SVG-RCA   Arthritis    Cerebrovascular disease    Left carotid bruit with 40-59% ICA stenosis; right carotid endarectomy in 2008   Cholelithiasis 07/21/2009   Asymptomatic   Colonic polyp 06/27/1997   Adenoma resected in 1999; history of diverticulosis   Coronary artery disease    Gastroesophageal reflux disease    Dilation of esophagel stricture in 2008   Hyperlipidemia    Hypertension  Myocardial infarction (HCC) 09/25/2005    Past Surgical History:  Procedure Laterality Date   CARDIAC CATHETERIZATION     cardiac stent placed   CAROTID ENDARTERECTOMY  2008   Right   CHOLECYSTECTOMY N/A 03/28/2014   Procedure: LAPAROSCOPIC CHOLECYSTECTOMY;  Surgeon: Oneil DELENA Budge, MD;  Location: [facility];  Service: General;  Laterality: N/A;   COLONOSCOPY  2008   adenoma resected in 1999; no intervention in 2008   COLONOSCOPY WITH ESOPHAGOGASTRODUODENOSCOPY (EGD)  Feb 2010   Dr. Shaaron: mild erosive reflux esophagitis, non-critical Schatzki's ring without dilation, moderate sized hiatal hernia, bulbar erosions. Negative H.pylori serology. Colonoscopy with normal rectum, pancolonic diverticula, needs surveillance every 5 years due to history of adenomas.    CORONARY ARTERY BYPASS GRAFT N/A 04/24/2017   Procedure: CORONARY ARTERY BYPASS GRAFTING (CABG)x3 using left internal mammary artery and left greater saphenous vein,  bilateral saphenous vein harvested endoscopically;  Surgeon: Lucas Dorise POUR, MD;  Location: [facility] OR;  Service: Open Heart Surgery;  Laterality: N/A;   LEFT HEART CATH AND CORONARY ANGIOGRAPHY N/A 04/19/2017   Procedure: LEFT HEART CATH AND CORONARY ANGIOGRAPHY;  Surgeon: Verlin Lonni BIRCH, MD;  Location: [facility];  Service: Cardiovascular;  Laterality: N/A;   TEE WITHOUT CARDIOVERSION N/A 04/24/2017   Procedure: TRANSESOPHAGEAL ECHOCARDIOGRAM (TEE);  Surgeon: Lucas Dorise POUR, MD;  Location: [facility] OR;  Service: Open Heart Surgery;  Laterality: N/A;    Family History  Problem Relation Age of Onset   Heart disease Father        Before age 81   Hyperlipidemia Father    Hypertension Father    Heart disease Brother    Heart attack Brother    Heart disease Brother        Before age 80-  Quad-BPG   Hyperlipidemia Brother    Hypertension Brother    Heart attack Brother    Pancreatic cancer Daughter    Heart disease Paternal Grandmother    Heart disease Paternal Grandfather    Stroke Other        Grandparent   Heart attack Other        Grandparent   Heart block Other        Brother   Colon cancer Neg Hx     Social History   Socioeconomic History   Marital status: Married    Spouse name: Not on file   Number of children: Not on file   Years of education: Not on file   Highest education level: Not on file  Occupational History   Occupation: [employer]    Comment: Part time for 50 years-working 3 days per week, now retired June 2015.   Tobacco Use   Smoking status: Never   Smokeless tobacco: Never  Vaping Use   Vaping status: Never Used  Substance and Sexual Activity   Alcohol use: No    Alcohol/week: 0.0 standard drinks of alcohol   Drug use: No   Sexual activity: Yes    Partners: Female    Birth control/protection: None  Other Topics Concern   Not on file  Social History Narrative   Married and resides in [place]    Social Drivers of Health    Financial Resource Strain: Low Risk  (08/31/2023)   Received from [facility]   Overall Financial Resource Strain (CARDIA)    Difficulty of Paying Living Expenses: Not hard at all  Food Insecurity: No Food Insecurity (08/31/2023)   Received from [facility]  Hunger Vital Sign    Within the past 12 months, you worried that your food would run out before you got the money to buy more.: Never true    Within the past 12 months, the food you bought just didn't last and you didn't have money to get more.: Never true  Transportation Needs: No Transportation Needs (08/31/2023)   Received from [facility]   PRAPARE - Transportation    Lack of Transportation (Medical): No    Lack of Transportation (Non-Medical): No  Physical Activity: Insufficiently Active (08/31/2023)   Received from [facility]   Exercise Vital Sign    On average, how many days per week do you engage in moderate to strenuous exercise (like a brisk walk)?: 1 day    On average, how many minutes do you engage in exercise at this level?: 20 min  Stress: No Stress Concern Present (08/31/2023)   Received from [facility] - Occupational Stress Questionnaire    Feeling of Stress : Not at all  Social Connections: Socially Integrated (08/31/2023)   Received from [facility]   Social Network    How would you rate your social network (family, work, friends)?: Good participation with social networks  Intimate Partner Violence: Not At Risk (08/31/2023)   Received from [facility]   HITS    Over the last 12 months how often did your partner physically hurt you?: Never    Over the last 12 months how often did your partner insult you or talk down to you?: Never    Over the last 12 months how often did your partner threaten you with physical harm?: Never    Over the last 12 months how often did your partner scream or curse at you?: Never    Review of Systems  All other systems reviewed and  are negative.       Objective    BP 119/72   Pulse (!) 56   Temp 97.9 F (36.6 C)   Ht 5' 8 (1.727 m)   Wt 189 lb 9.6 oz (86 kg)   SpO2 97%   BMI 28.83 kg/m   Physical Exam      Assessment & Plan:   Problem List Items Addressed This Visit     Arteriosclerotic cardiovascular disease (ASCVD)   Controlled HTN, HLD, BG  Stent to RI in 2004. Posterior MI in 4/07 with thrombosis of RI at stent site while off Plavix  --> reintervention Lifelong Rx with CLOPIDIGREL or similar agent      Relevant Orders   CBC with Differential/Platelet   Comprehensive metabolic panel with GFR   Lipid panel   Hypertension   Normotensive Recommend heart healthy diet such as Mediterranean diet with whole grains, fruits, vegetable, fish, lean meats, nuts, and olive oil. Limit salt. Encouraged moderate exercise as tolerated, 3-5 times/week for 30-50 minutes each session. Aim for at least 150 minutes.week. Goal should be pace of 3 miles/hours, or walking 1.5 miles in 30 minutes. Avoid tobacco products. Avoid excess alcohol. Take medications as prescribed and bring medications and blood pressure log with cuff to each office visit. Seek medical care for chest pain, palpitations, shortness of breath with exertion, dizziness/lightheadedness, vision changes, recurrent headaches, or swelling of extremities.       Relevant Orders   CBC with Differential/Platelet   Comprehensive metabolic panel with GFR   Lipid panel   Gastroesophageal reflux disease   Dilation of esophagel stricture in 2008. Continue  omeprazole 20mg  daily.  Elevated HOB if needed and avoid lying down 2-3 hours after eating, avoid coffee, alcohol, chocolate, fatty foods, citrus, carbonated beverages, spicy foods, late meals, and smoking. Return to office if symptoms return or worsen and seek medical care for difficulty swallowing, bleeding, anemia, weight loss, or recurrent vomiting.         Relevant Medications   omeprazole  (PRILOSEC) 20 MG capsule   Hyperlipidemia   Labs today.  Your labs showed elevated cholesterol. I recommend consuming a heart healthy diet such as Mediterranean diet or DASH diet with whole grains, fruits, vegetable, fish, lean meats, nuts, and olive oil. Limit sweets and processed foods. I also encourage moderate intensity exercise 150 minutes weekly as tolerated. This is 3-5 times weekly for 30-50 minutes each session. Goal should be pace of 3 miles/hours, or walking 1.5 miles in 30 minutes.       Relevant Orders   CBC with Differential/Platelet   Comprehensive metabolic panel with GFR   Lipid panel   Carotid stenosis   Followed by cardiology, continue simvastatin . s/p R CEA in 2008, 70% narrowing along the proximal LICA and history of right CEA with 50% stenosis, refused vascular surgery, needs repeat imaging in 2026      Coronary artery disease - Primary   followed by cardiology, s/p prior stenting to RI in 2004 and ISR in 2007, cath in 03/2017 showing multivessel CAD and underwent CABG with LIMA-LAD, SVG-RI and SVG-RCA, on ASA and Plavix  lifetime per review of cardiology note      Hypothyroidism   TSH today. Continue synthroid 50mcg daily. The correct intake of thyroid  hormone (Levothyroxine, Synthroid), is on empty stomach first thing in the morning, with water , separated by at least 30 minutes from breakfast and other medications,  and separated by more than 4 hours from calcium , iron , multivitamins, acid reflux medications (PPIs).   - This medication is a life-long medication and will be needed to correct thyroid  hormone imbalances for the rest of your life.  The dose may change from time to time, based on thyroid  blood work.   - It is extremely important to be consistent taking this medication, near the same time each morning.   -AVOID TAKING PRODUCTS CONTAINING BIOTIN (commonly found in Hair, Skin, Nails vitamins) AS IT INTERFERES WITH THE VALIDITY OF THYROID  FUNCTION BLOOD  TESTS.       Relevant Orders   TSH   Vitamin D deficiency   Vitamin d level today      Relevant Orders   VITAMIN D 25 Hydroxy (Vit-D Deficiency, Fractures)   Rheumatoid arthritis (HCC)   Continue methotrexate 7.5mg  weekly      Other Visit Diagnoses       Screening for diabetes mellitus       Relevant Orders   Hemoglobin A1c     Need for vaccination       Relevant Orders   Flu vaccine HIGH DOSE PF(Fluzone Trivalent) (Completed)       No follow-ups on file.   Kevin GORMAN Barrio, FNP

## 2024-03-28 NOTE — Assessment & Plan Note (Addendum)
 Normotensive Recommend heart healthy diet such as Mediterranean diet with whole grains, fruits, vegetable, fish, lean meats, nuts, and olive oil. Limit salt. Encouraged moderate exercise as tolerated, 3-5 times/week for 30-50 minutes each session. Aim for at least 150 minutes.week. Goal should be pace of 3 miles/hours, or walking 1.5 miles in 30 minutes. Avoid tobacco products. Avoid excess alcohol. Take medications as prescribed and bring medications and blood pressure log with cuff to each office visit. Seek medical care for chest pain, palpitations, shortness of breath with exertion, dizziness/lightheadedness, vision changes, recurrent headaches, or swelling of extremities.

## 2024-04-02 ENCOUNTER — Ambulatory Visit: Payer: Self-pay | Admitting: Family Medicine

## 2024-04-03 ENCOUNTER — Other Ambulatory Visit: Payer: Self-pay | Admitting: Family Medicine

## 2024-04-03 LAB — TSH: TSH: 4.5 m[IU]/L (ref 0.40–4.50)

## 2024-04-03 LAB — COMPREHENSIVE METABOLIC PANEL WITH GFR
AG Ratio: 1.5 (calc) (ref 1.0–2.5)
ALT: 20 U/L (ref 9–46)
AST: 21 U/L (ref 10–35)
Albumin: 4 g/dL (ref 3.6–5.1)
Alkaline phosphatase (APISO): 44 U/L (ref 35–144)
BUN: 19 mg/dL (ref 7–25)
CO2: 27 mmol/L (ref 20–32)
Calcium: 9.5 mg/dL (ref 8.6–10.3)
Chloride: 107 mmol/L (ref 98–110)
Creat: 1.07 mg/dL (ref 0.70–1.22)
Globulin: 2.6 g/dL (ref 1.9–3.7)
Glucose, Bld: 105 mg/dL — ABNORMAL HIGH (ref 65–99)
Potassium: 4.9 mmol/L (ref 3.5–5.3)
Sodium: 140 mmol/L (ref 135–146)
Total Bilirubin: 0.3 mg/dL (ref 0.2–1.2)
Total Protein: 6.6 g/dL (ref 6.1–8.1)
eGFR: 69 mL/min/1.73m2 (ref >=60)

## 2024-04-03 LAB — CBC WITH DIFFERENTIAL/PLATELET
Absolute Lymphocytes: 2718 {cells}/uL (ref 850–3900)
Absolute Monocytes: 830 {cells}/uL (ref 200–950)
Basophils Absolute: 24 {cells}/uL (ref 0–200)
Basophils Relative: 0.3 %
Eosinophils Absolute: 348 {cells}/uL (ref 15–500)
Eosinophils Relative: 4.4 %
HCT: 40.4 % (ref 38.5–50.0)
Hemoglobin: 12.8 g/dL — ABNORMAL LOW (ref 13.2–17.1)
MCH: 31.8 pg (ref 27.0–33.0)
MCHC: 31.7 g/dL — ABNORMAL LOW (ref 32.0–36.0)
MCV: 100.5 fL — ABNORMAL HIGH (ref 80.0–100.0)
MPV: 11.3 fL (ref 7.5–12.5)
Monocytes Relative: 10.5 %
Neutro Abs: 3982 {cells}/uL (ref 1500–7800)
Neutrophils Relative %: 50.4 %
Platelets: 285 Thousand/uL (ref 140–400)
RBC: 4.02 Million/uL — ABNORMAL LOW (ref 4.20–5.80)
RDW: 13.6 % (ref 11.0–15.0)
Total Lymphocyte: 34.4 %
WBC: 7.9 Thousand/uL (ref 3.8–10.8)

## 2024-04-03 LAB — IRON,TIBC AND FERRITIN PANEL
%SAT: 25 % (ref 20–48)
Ferritin: 75 ng/mL (ref 24–380)
Iron: 74 ug/dL (ref 50–180)
TIBC: 302 ug/dL (ref 250–425)

## 2024-04-03 LAB — VITAMIN D 25 HYDROXY (VIT D DEFICIENCY, FRACTURES): Vit D, 25-Hydroxy: 45 ng/mL (ref 30–100)

## 2024-04-03 LAB — TEST AUTHORIZATION

## 2024-04-03 LAB — LIPID PANEL
Cholesterol: 121 mg/dL (ref <200)
HDL: 38 mg/dL — ABNORMAL LOW (ref >=40)
LDL Cholesterol (Calc): 58 mg/dL
Non-HDL Cholesterol (Calc): 83 mg/dL (ref <130)
Total CHOL/HDL Ratio: 3.2 (calc) (ref <5.0)
Triglycerides: 172 mg/dL — ABNORMAL HIGH (ref <150)

## 2024-04-03 LAB — HEMOGLOBIN A1C
Hgb A1c MFr Bld: 5.6 % (ref <5.7)
Mean Plasma Glucose: 114 mg/dL
eAG (mmol/L): 6.3 mmol/L

## 2024-04-04 NOTE — Addendum Note (Signed)
 Addended by: CORINNA JESUSA SAUNDERS on: 04/04/2024 08:22 AM   Modules accepted: Orders

## 2024-05-10 ENCOUNTER — Other Ambulatory Visit: Payer: Self-pay | Admitting: Student

## 2024-07-24 NOTE — Progress Notes (Signed)
 "  Cardiology Office Note    Date:  07/26/2024  ID:  Reeves, Kevin 1940-11-16, MRN [id number] Cardiologist: Alvan Carrier, MD Cardiology APP:  Johnson Laymon CHRISTELLA, PA-C { : History of Present Illness:    Kevin Reeves is a 84 y.o. male  with past medical history of CAD (s/p prior stenting to RI in 2004 and ISR in 2007, cath in 03/2017 showing multivessel CAD and underwent CABG with LIMA-LAD, SVG-RI and SVG-RCA), carotid artery stenosis (s/p R CEA in 2008), HLD, hypothyroidism and RA who presents to the office today for 74-month follow-up.  He was last examined by myself in 11/2023 and reported overall feeling well at that time and denied any recent anginal symptoms. He did report having worsening balance issues over the past several months. CTA Neck in 06/2023 had shown 70% stenosis along the LICA and 50% stenosis along the RICA with plans for follow-up imaging in 1 year and he was previously not interested in referral back to Vascular Surgery. It had previously been recommended to be on lifelong DAPT and he was continued on ASA 81 mg daily and Plavix  75 mg daily along with Crestor  20 mg daily and Zetia  10 mg daily.  In talking with the patient and his wife today, he reports overall doing well since his last office visit. He does not exercise given his age but does remain active around his home and denies any recent chest pain or progressive dyspnea on exertion. No specific orthopnea, PND or pitting edema. No dizziness or presyncope. He has been taking Omeprazole for acid reflux but this was not on his medication list during his last several visits.   Studies Reviewed:   EKG: EKG is ordered today and demonstrates:   EKG Interpretation Date/Time:  Thursday July 25 2024 14:09:19 EST Ventricular Rate:  72 PR Interval:  148 QRS Duration:  102 QT Interval:  432 QTC Calculation: 473 R Axis:   -26  Text Interpretation: Sinus rhythm with occasional Premature ventricular complexes  Slight TWI along Lead III Confirmed by Johnson Laymon ([id number]) on 07/25/2024 2:18:57 PM       Echocardiogram: 03/2023 IMPRESSIONS     1. Left ventricular ejection fraction, by estimation, is 55 to 60%. The  left ventricle has normal function. The left ventricle has no regional  wall motion abnormalities. There is mild left ventricular hypertrophy.  Left ventricular diastolic parameters  are indeterminate.   2. Right ventricular systolic function is normal. The right ventricular  size is normal. Tricuspid regurgitation signal is inadequate for assessing  PA pressure.   3. The mitral valve is normal in structure. No evidence of mitral valve  regurgitation. No evidence of mitral stenosis.   4. The aortic valve is tricuspid. There is severe calcifcation of the  aortic valve. There is severe thickening of the aortic valve. Aortic valve  regurgitation is not visualized. Aortic valve sclerosis/calcification is  present, without any evidence of  aortic stenosis.   5. Aortic dilatation noted. There is mild dilatation of the ascending  aorta, measuring 37 mm.    Carotid Dopplers: 03/2023 IMPRESSION: Right:   Surgical changes of right carotid endarterectomy. Note that established duplex criteria have not been validated in the setting of prior endarterectomy, however, there is no evidence of recurrent high-grade stenosis.   Left:   Heterogeneous and partially calcified plaque at the left carotid bifurcation contributes to 70%-99% stenosis by established duplex criteria.  CTA Neck: 07/2023 IMPRESSION: 1. 70% atheromatous narrowing at the  proximal left ICA. 2. History of right carotid endarterectomy with irregular proximal ICA and 50% stenosis. 3. 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 4. Flow reducing stenosis at the left vertebral origin.   Physical Exam:   VS:  BP 130/74 (BP Location: Right Arm, Cuff Size:  Normal)   Pulse 72   Ht 5' 8 (1.727 m)   Wt 190 lb 6.4 oz (86.4 kg)   BMI 28.95 kg/m    Wt Readings from Last 3 Encounters:  07/25/24 190 lb 6.4 oz (86.4 kg)  03/28/24 189 lb 9.6 oz (86 kg)  12/12/23 190 lb 3.2 oz (86.3 kg)     GEN: Pleasant male appearing in no acute distress NECK: No JVD; No carotid bruits CARDIAC: RRR, no murmurs, rubs, gallops RESPIRATORY:  Clear to auscultation without rales, wheezing or rhonchi  ABDOMEN: Appears non-distended. No obvious abdominal masses. EXTREMITIES: No clubbing or cyanosis. No pitting edema.  Distal pedal pulses are 2+ bilaterally.   Assessment and Plan:   1. Coronary artery disease involving native coronary artery of native heart without angina pectoris - He previously underwent CABG in 03/2017 with LIMA-LAD, SVG-RI and SVG-RCA. He denies any recent anginal symptoms. - Continue current medical therapy with ASA 81 mg daily, Plavix  75 mg daily and Crestor  20 mg daily. Previously recommended to continue lifelong DAPT. He does take Omeprazole for acid reflux and given the concurrent use of Plavix , recommended stopping this and switching to Protonix  20 mg daily.  2. Hyperlipidemia LDL goal <55 - FLP in 03/2024 showed total cholesterol 121, HDL 38, triglycerides 172 and LDL 58.LFT's were normal with AST at 21 and ALT at 20. Continue Crestor  20 mg daily.  3. Bilateral carotid artery stenosis - He did undergo right CEA in 2008 and CTA Neck in 06/2023 showed 70% stenosis along the LICA and 50% stenosis along the RICA. Will plan for follow-up CT imaging within the next few months for reassessment. He has preferred this as compared to referral back to Vascular Surgery. Continue ASA 81 mg daily, Plavix  75 mg daily and Crestor  20 mg daily.  Signed, Laymon CHRISTELLA Qua, PA-C   "

## 2024-07-25 ENCOUNTER — Encounter: Payer: Self-pay | Admitting: Student

## 2024-07-25 ENCOUNTER — Ambulatory Visit: Admitting: Student

## 2024-07-25 VITALS — BP 130/74 | HR 72 | Ht 68.0 in | Wt 190.4 lb

## 2024-07-25 DIAGNOSIS — I6523 Occlusion and stenosis of bilateral carotid arteries: Secondary | ICD-10-CM | POA: Diagnosis not present

## 2024-07-25 DIAGNOSIS — I2510 Atherosclerotic heart disease of native coronary artery without angina pectoris: Secondary | ICD-10-CM | POA: Diagnosis not present

## 2024-07-25 DIAGNOSIS — E785 Hyperlipidemia, unspecified: Secondary | ICD-10-CM | POA: Diagnosis not present

## 2024-07-25 MED ORDER — CLOPIDOGREL BISULFATE 75 MG PO TABS: 75.0000 mg | ORAL_TABLET | Freq: Every day | ORAL | 3 refills | Status: AC

## 2024-07-25 MED ORDER — PANTOPRAZOLE SODIUM 20 MG PO TBEC: 20.0000 mg | DELAYED_RELEASE_TABLET | Freq: Every day | ORAL | 3 refills | Status: AC

## 2024-07-25 NOTE — Patient Instructions (Signed)
 Medication Instructions:   Stop Omeprazole. Start Protonix  20mg  daily.   *If you need a refill on your cardiac medications before your next appointment, please call your pharmacy*  Lab Work:  BMET prior to CT Scan.   If you have labs (blood work) drawn today and your tests are completely normal, you will receive your results only by: MyChart Message (if you have MyChart) OR A paper copy in the mail If you have any lab test that is abnormal or we need to change your treatment, we will call you to review the results.  Testing/Procedures:  CTA Neck - to check carotid arteries.   Follow-Up: At [facility], you and your health needs are our priority.  As part of our continuing mission to provide you with exceptional heart care, our providers are all part of one team.  This team includes your primary Cardiologist (physician) and Advanced Practice Providers or APPs (Physician Assistants and Nurse Practitioners) who all work together to provide you with the care you need, when you need it.  Your next appointment:   6 month(s)  Provider:   You may see Alvan Carrier, MD or one of the following Advanced Practice Providers on your designated Care Team:   Laymon Qua, PA-C  [place], [place] Olivia [place], [place]     We recommend signing up for the patient portal called MyChart.  Sign up information is provided on this After Visit Summary.  MyChart is used to connect with patients for Virtual Visits (Telemedicine).  Patients are able to view lab/test results, encounter notes, upcoming appointments, etc.  Non-urgent messages can be sent to your provider as well.   To learn more about what you can do with MyChart, go to [email or web address].

## 2024-07-26 ENCOUNTER — Encounter: Payer: Self-pay | Admitting: Student

## 2024-08-15 ENCOUNTER — Ambulatory Visit (HOSPITAL_COMMUNITY)
